# Patient Record
Sex: Female | Born: 1945 | Race: Asian | Hispanic: No | Smoking: Never smoker
Health system: Southern US, Community
[De-identification: ages and names within clinical notes are randomized; demographics above are authoritative.]

## PROBLEM LIST (undated history)

## (undated) DIAGNOSIS — E7800 Pure hypercholesterolemia, unspecified: Secondary | ICD-10-CM

## (undated) DIAGNOSIS — I10 Essential (primary) hypertension: Secondary | ICD-10-CM

## (undated) DIAGNOSIS — I5032 Chronic diastolic (congestive) heart failure: Secondary | ICD-10-CM

---

## 2016-02-24 ENCOUNTER — Encounter (HOSPITAL_COMMUNITY): Payer: Self-pay | Admitting: Emergency Medicine

## 2016-02-24 ENCOUNTER — Emergency Department (HOSPITAL_COMMUNITY): Payer: Medicaid Other

## 2016-02-24 ENCOUNTER — Inpatient Hospital Stay (HOSPITAL_COMMUNITY)
Admission: EM | Admit: 2016-02-24 | Discharge: 2016-04-09 | DRG: 003 | Disposition: A | Discharge status: Home Health | Payer: Medicaid Other | Attending: Internal Medicine | Admitting: Internal Medicine

## 2016-02-24 DIAGNOSIS — J9622 Acute and chronic respiratory failure with hypercapnia: Secondary | ICD-10-CM | POA: Diagnosis present

## 2016-02-24 DIAGNOSIS — E8729 Other acidosis: Secondary | ICD-10-CM | POA: Diagnosis present

## 2016-02-24 DIAGNOSIS — R451 Restlessness and agitation: Secondary | ICD-10-CM | POA: Diagnosis not present

## 2016-02-24 DIAGNOSIS — Z4659 Encounter for fitting and adjustment of other gastrointestinal appliance and device: Secondary | ICD-10-CM

## 2016-02-24 DIAGNOSIS — R1031 Right lower quadrant pain: Secondary | ICD-10-CM | POA: Diagnosis not present

## 2016-02-24 DIAGNOSIS — J9690 Respiratory failure, unspecified, unspecified whether with hypoxia or hypercapnia: Secondary | ICD-10-CM

## 2016-02-24 DIAGNOSIS — G9341 Metabolic encephalopathy: Secondary | ICD-10-CM | POA: Diagnosis not present

## 2016-02-24 DIAGNOSIS — J9621 Acute and chronic respiratory failure with hypoxia: Secondary | ICD-10-CM | POA: Diagnosis present

## 2016-02-24 DIAGNOSIS — G9340 Encephalopathy, unspecified: Secondary | ICD-10-CM | POA: Diagnosis present

## 2016-02-24 DIAGNOSIS — Z7982 Long term (current) use of aspirin: Secondary | ICD-10-CM

## 2016-02-24 DIAGNOSIS — E049 Nontoxic goiter, unspecified: Secondary | ICD-10-CM

## 2016-02-24 DIAGNOSIS — J9601 Acute respiratory failure with hypoxia: Secondary | ICD-10-CM

## 2016-02-24 DIAGNOSIS — Z79899 Other long term (current) drug therapy: Secondary | ICD-10-CM

## 2016-02-24 DIAGNOSIS — D649 Anemia, unspecified: Secondary | ICD-10-CM | POA: Diagnosis present

## 2016-02-24 DIAGNOSIS — Z01818 Encounter for other preprocedural examination: Secondary | ICD-10-CM

## 2016-02-24 DIAGNOSIS — J9811 Atelectasis: Secondary | ICD-10-CM

## 2016-02-24 DIAGNOSIS — I10 Essential (primary) hypertension: Secondary | ICD-10-CM | POA: Diagnosis present

## 2016-02-24 DIAGNOSIS — E042 Nontoxic multinodular goiter: Principal | ICD-10-CM | POA: Diagnosis present

## 2016-02-24 DIAGNOSIS — K5903 Drug induced constipation: Secondary | ICD-10-CM | POA: Diagnosis present

## 2016-02-24 DIAGNOSIS — N179 Acute kidney failure, unspecified: Secondary | ICD-10-CM | POA: Diagnosis present

## 2016-02-24 DIAGNOSIS — R1310 Dysphagia, unspecified: Secondary | ICD-10-CM | POA: Diagnosis present

## 2016-02-24 DIAGNOSIS — R109 Unspecified abdominal pain: Secondary | ICD-10-CM

## 2016-02-24 DIAGNOSIS — R1313 Dysphagia, pharyngeal phase: Secondary | ICD-10-CM | POA: Diagnosis present

## 2016-02-24 DIAGNOSIS — R34 Anuria and oliguria: Secondary | ICD-10-CM | POA: Diagnosis present

## 2016-02-24 DIAGNOSIS — T40605A Adverse effect of unspecified narcotics, initial encounter: Secondary | ICD-10-CM | POA: Diagnosis present

## 2016-02-24 DIAGNOSIS — J9602 Acute respiratory failure with hypercapnia: Secondary | ICD-10-CM

## 2016-02-24 DIAGNOSIS — J189 Pneumonia, unspecified organism: Secondary | ICD-10-CM | POA: Diagnosis present

## 2016-02-24 DIAGNOSIS — R627 Adult failure to thrive: Secondary | ICD-10-CM | POA: Diagnosis present

## 2016-02-24 DIAGNOSIS — Z930 Tracheostomy status: Secondary | ICD-10-CM

## 2016-02-24 DIAGNOSIS — J810 Acute pulmonary edema: Secondary | ICD-10-CM | POA: Diagnosis present

## 2016-02-24 DIAGNOSIS — E872 Acidosis: Secondary | ICD-10-CM | POA: Diagnosis not present

## 2016-02-24 DIAGNOSIS — Z931 Gastrostomy status: Secondary | ICD-10-CM | POA: Diagnosis present

## 2016-02-24 DIAGNOSIS — Z430 Encounter for attention to tracheostomy: Secondary | ICD-10-CM | POA: Insufficient documentation

## 2016-02-24 DIAGNOSIS — E871 Hypo-osmolality and hyponatremia: Secondary | ICD-10-CM | POA: Diagnosis not present

## 2016-02-24 DIAGNOSIS — Z9289 Personal history of other medical treatment: Secondary | ICD-10-CM

## 2016-02-24 DIAGNOSIS — K9429 Other complications of gastrostomy: Secondary | ICD-10-CM

## 2016-02-24 DIAGNOSIS — E870 Hyperosmolality and hypernatremia: Secondary | ICD-10-CM | POA: Diagnosis present

## 2016-02-24 DIAGNOSIS — F419 Anxiety disorder, unspecified: Secondary | ICD-10-CM | POA: Diagnosis present

## 2016-02-24 DIAGNOSIS — Z90710 Acquired absence of both cervix and uterus: Secondary | ICD-10-CM | POA: Diagnosis present

## 2016-02-24 DIAGNOSIS — K5900 Constipation, unspecified: Secondary | ICD-10-CM

## 2016-02-24 DIAGNOSIS — E890 Postprocedural hypothyroidism: Secondary | ICD-10-CM | POA: Diagnosis present

## 2016-02-24 DIAGNOSIS — I110 Hypertensive heart disease with heart failure: Secondary | ICD-10-CM | POA: Diagnosis present

## 2016-02-24 DIAGNOSIS — I959 Hypotension, unspecified: Secondary | ICD-10-CM | POA: Diagnosis not present

## 2016-02-24 DIAGNOSIS — Z6841 Body Mass Index (BMI) 40.0 and over, adult: Secondary | ICD-10-CM

## 2016-02-24 DIAGNOSIS — J9600 Acute respiratory failure, unspecified whether with hypoxia or hypercapnia: Secondary | ICD-10-CM

## 2016-02-24 DIAGNOSIS — J690 Pneumonitis due to inhalation of food and vomit: Secondary | ICD-10-CM | POA: Diagnosis present

## 2016-02-24 DIAGNOSIS — I5032 Chronic diastolic (congestive) heart failure: Secondary | ICD-10-CM | POA: Diagnosis present

## 2016-02-24 DIAGNOSIS — Z978 Presence of other specified devices: Secondary | ICD-10-CM

## 2016-02-24 DIAGNOSIS — E876 Hypokalemia: Secondary | ICD-10-CM | POA: Diagnosis present

## 2016-02-24 HISTORY — DX: Chronic diastolic (congestive) heart failure: I50.32

## 2016-02-24 HISTORY — DX: Essential (primary) hypertension: I10

## 2016-02-24 LAB — I-STAT ARTERIAL BLOOD GAS, ED
ACID-BASE EXCESS: 3 mmol/L — AB (ref 0.0–2.0)
BICARBONATE: 35.2 meq/L — AB (ref 20.0–24.0)
O2 Saturation: 94 %
PH ART: 7.176 — AB (ref 7.350–7.450)
TCO2: 38 mmol/L (ref 0–100)
pCO2 arterial: 95.7 mmHg (ref 35.0–45.0)
pO2, Arterial: 93 mmHg (ref 80.0–100.0)

## 2016-02-24 LAB — BASIC METABOLIC PANEL
Anion gap: 10 (ref 5–15)
BUN: 33 mg/dL — AB (ref 6–20)
CALCIUM: 8.7 mg/dL — AB (ref 8.9–10.3)
CO2: 33 mmol/L — AB (ref 22–32)
CREATININE: 1.2 mg/dL — AB (ref 0.44–1.00)
Chloride: 97 mmol/L — ABNORMAL LOW (ref 101–111)
GFR calc Af Amer: 53 mL/min — ABNORMAL LOW (ref >60)
GFR calc non Af Amer: 45 mL/min — ABNORMAL LOW (ref >60)
GLUCOSE: 140 mg/dL — AB (ref 65–99)
Potassium: 4.2 mmol/L (ref 3.5–5.1)
Sodium: 140 mmol/L (ref 135–145)

## 2016-02-24 LAB — CBC
HEMATOCRIT: 40.2 % (ref 36.0–46.0)
HEMOGLOBIN: 12 g/dL (ref 12.0–15.0)
MCH: 26 pg (ref 26.0–34.0)
MCHC: 29.9 g/dL — AB (ref 30.0–36.0)
MCV: 87 fL (ref 78.0–100.0)
Platelets: 280 10*3/uL (ref 150–400)
RBC: 4.62 MIL/uL (ref 3.87–5.11)
RDW: 17.2 % — ABNORMAL HIGH (ref 11.5–15.5)
WBC: 7.8 10*3/uL (ref 4.0–10.5)

## 2016-02-24 LAB — I-STAT TROPONIN, ED: Troponin i, poc: 0.09 ng/mL (ref 0.00–0.08)

## 2016-02-24 LAB — TROPONIN I: Troponin I: 0.14 ng/mL — ABNORMAL HIGH (ref <0.031)

## 2016-02-24 LAB — BRAIN NATRIURETIC PEPTIDE: B Natriuretic Peptide: 983.4 pg/mL — ABNORMAL HIGH (ref 0.0–100.0)

## 2016-02-24 LAB — D-DIMER, QUANTITATIVE: D-Dimer, Quant: 3.57 ug/mL-FEU — ABNORMAL HIGH (ref 0.00–0.50)

## 2016-02-24 MED ORDER — MORPHINE SULFATE (PF) 4 MG/ML IV SOLN
4.0000 mg | Freq: Once | INTRAVENOUS | Status: DC
  Filled 2016-02-24: qty 1

## 2016-02-24 MED ORDER — ETOMIDATE 2 MG/ML IV SOLN
20.0000 mg | Freq: Once | INTRAVENOUS | Status: AC
  Administered 2016-02-24: 20 mg via INTRAVENOUS

## 2016-02-24 MED ORDER — IOHEXOL 350 MG/ML SOLN: 50.0000 mL | Freq: Once | INTRAVENOUS | Status: DC | PRN

## 2016-02-24 MED ORDER — SUCCINYLCHOLINE CHLORIDE 20 MG/ML IJ SOLN
100.0000 mg | Freq: Once | INTRAMUSCULAR | Status: AC
  Administered 2016-02-24: 100 mg via INTRAVENOUS

## 2016-02-24 MED ORDER — IOHEXOL 350 MG/ML SOLN: 100.0000 mL | Freq: Once | INTRAVENOUS | Status: DC | PRN

## 2016-02-24 MED ORDER — ETOMIDATE 2 MG/ML IV SOLN
INTRAVENOUS | Status: AC | PRN
  Administered 2016-02-24: 20 mg via INTRAVENOUS

## 2016-02-24 MED ORDER — SUCCINYLCHOLINE CHLORIDE 20 MG/ML IJ SOLN
INTRAMUSCULAR | Status: AC | PRN
  Administered 2016-02-24: 100 mg via INTRAVENOUS

## 2016-02-24 MED ORDER — MIDAZOLAM HCL 2 MG/2ML IJ SOLN
INTRAMUSCULAR | Status: AC
  Administered 2016-02-24: 4 mg
  Filled 2016-02-24: qty 4

## 2016-02-24 MED ORDER — IOHEXOL 350 MG/ML SOLN
50.0000 mL | Freq: Once | INTRAVENOUS | Status: DC | PRN
Start: 2016-02-24 — End: 2016-03-09

## 2016-02-24 MED ORDER — LORAZEPAM 2 MG/ML IJ SOLN
0.5000 mg | Freq: Once | INTRAMUSCULAR | Status: AC
  Administered 2016-02-24: 2 mg via INTRAVENOUS
  Filled 2016-02-24: qty 1

## 2016-02-24 MED ORDER — FENTANYL CITRATE (PF) 100 MCG/2ML IJ SOLN
INTRAMUSCULAR | Status: AC
  Administered 2016-02-24: 50 ug
  Filled 2016-02-24: qty 2

## 2016-02-24 MED ORDER — PROPOFOL 1000 MG/100ML IV EMUL
5.0000 ug/kg/min | Freq: Once | INTRAVENOUS | Status: DC
  Administered 2016-02-24: 5 ug/kg/min via INTRAVENOUS

## 2016-02-24 MED ORDER — PROPOFOL 1000 MG/100ML IV EMUL
INTRAVENOUS | Status: AC
  Filled 2016-02-24: qty 100

## 2016-02-24 NOTE — ED Provider Notes (Signed)
CSN: [id number]     Arrival date & time 02/24/16  1928 History   First MD Initiated Contact with Patient 02/24/16 1958     Chief Complaint  Patient presents with  . Shortness of Breath  . Oral Swelling     HPI Patient presents to [place] from [place] with no known past medical history for hypertension.  She has been in the [place] for 3 weeks and it's reported that she's had increasing and persistent left neck swelling of the past 2-3 months but family feels like is beginning to increase in size and they now believe that she is having some swelling of her tongue.  There also reported increasing shortness of breath over the past 2 or 3 days.  She also has had increasing leg pain and swelling over the past several days.  No known history of DVT or pulmonary embolism.  She is not on any daily medications.  She was found to be hypoxic on arrival to the emergency department with initial oxygen saturations of 76%.  On 6 L nasal cannula her pulse oximetry is 94%.  She is tachypneic.  Unable to utilize translator services but she has 2 family members who speak good English who assisted me throughout the majority of the history   Past Medical History  Diagnosis Date  . Hypertension    History reviewed. No pertinent past surgical history. No family history on file. Social History  Substance Use Topics  . Smoking status: Never Smoker   . Smokeless tobacco: None  . Alcohol Use: No   OB History    No data available     Review of Systems  All other systems reviewed and are negative.     Allergies  Review of patient's allergies indicates not on file.  Home Medications   Prior to Admission medications   Medication Sig Start Date End Date Taking? Authorizing Provider  acetaminophen (TYLENOL) 650 MG CR tablet Take 650 mg by mouth 2 (two) times daily as needed for pain.   Yes Historical Provider, MD  aspirin EC 81 MG tablet Take 81 mg by mouth daily as needed (for high blood pressure).    Yes Historical Provider, MD  naproxen sodium (ANAPROX) 220 MG tablet Take 220 mg by mouth daily as needed (for knee pain).   Yes Historical Provider, MD  PRESCRIPTION MEDICATION Take 1 tablet by mouth daily as needed (for high blood pressure).   Yes Historical Provider, MD   BP 184/102 mmHg  Pulse 94  Temp(Src) 99 F (37.2 C) (Oral)  Resp 24  SpO2 97% Physical Exam  Constitutional: She is oriented to person, place, and time. She appears well-developed and well-nourished.  Tachypnea.  Morbidly obese  HENT:  Head: Atraumatic.  Mouth/Throat: Oropharynx is clear and moist.  Poor dentition throughout.  Multiple teeth are absent  Eyes: EOM are normal.  Neck: Normal range of motion.  Large anterior neck mass left greater than right without overlying skin changes or erythema.  Cardiovascular: Regular rhythm and normal heart sounds.   Tachycardic  Pulmonary/Chest:  Tachypnea.  Mild accessory muscle use.  Mild rales or rhonchi in the bases  Abdominal: Soft. She exhibits no distension. There is no tenderness.  Neurological: She is alert and oriented to person, place, and time.  Skin: Skin is warm and dry.  Psychiatric: She has a normal mood and affect.  Nursing note and vitals reviewed.   ED Course  .Critical Care Performed by: Azalia Bilis Authorized by: Patria Mane  Nick Stults Total critical care time: 35 minutes Critical care time was exclusive of separately billable procedures and treating other patients. Critical care was necessary to treat or prevent imminent or life-threatening deterioration of the following conditions: respiratory failure. Critical care was time spent personally by me on the following activities: discussions with consultants, examination of patient, evaluation of patient's response to treatment, obtaining history from patient or surrogate, ordering and performing treatments and interventions, ordering and review of laboratory studies, ordering and review of radiographic  studies, pulse oximetry, ventilator management and re-evaluation of patient's condition.  .Intubation Performed by: Azalia BilisAMPOS, Andre Swander Authorized by: Azalia BilisAMPOS, Wylee Ogden Consent: Verbal consent obtained. Consent given by: patient Patient identity confirmed: arm band Time out: Immediately prior to procedure a "time out" was called to verify the correct patient, procedure, equipment, support staff and site/side marked as required. Indications: respiratory failure Intubation method: video-assisted Patient status: paralyzed (RSI) Preoxygenation: nonrebreather mask Sedatives: etomidate Paralytic: succinylcholine Laryngoscope size: glide 3. Tube size: 7.5 mm Tube type: cuffed Number of attempts: 1 Cords visualized: yes Post-procedure assessment: chest rise and CO2 detector Breath sounds: equal and absent over the epigastrium Cuff inflated: yes Tube secured with: ETT holder Patient tolerance: Patient tolerated the procedure well with no immediate complications Comments: Intubation confirmed on CT chest, ET tube in good position   (including critical care time) Labs Review Labs Reviewed  BASIC METABOLIC PANEL - Abnormal; Notable for the following:    Chloride 97 (*)    CO2 33 (*)    Glucose, Bld 140 (*)    BUN 33 (*)    Creatinine, Ser 1.20 (*)    Calcium 8.7 (*)    GFR calc non Af Amer 45 (*)    GFR calc Af Amer 53 (*)    All other components within normal limits  CBC - Abnormal; Notable for the following:    MCHC 29.9 (*)    RDW 17.2 (*)    All other components within normal limits  TROPONIN I - Abnormal; Notable for the following:    Troponin I 0.14 (*)    All other components within normal limits  D-DIMER, QUANTITATIVE (NOT AT [facility]) - Abnormal; Notable for the following:    D-Dimer, Quant 3.57 (*)    All other components within normal limits  BRAIN NATRIURETIC PEPTIDE - Abnormal; Notable for the following:    B Natriuretic Peptide 983.4 (*)    All other components within normal  limits  I-STAT TROPOININ, ED - Abnormal; Notable for the following:    Troponin i, poc 0.09 (*)    All other components within normal limits  I-STAT ARTERIAL BLOOD GAS, ED - Abnormal; Notable for the following:    pH, Arterial 7.176 (*)    pCO2 arterial 95.7 (*)    Bicarbonate 35.2 (*)    Acid-Base Excess 3.0 (*)    All other components within normal limits  TSH    Imaging Review Dg Chest Port 1 View  02/24/2016  CLINICAL DATA:  Subacute onset of hypoxia, shortness of breath and lower extremity edema. Initial encounter. EXAM: PORTABLE CHEST 1 VIEW COMPARISON:  None. FINDINGS: The lungs are hypoexpanded. Vascular congestion is noted. Mild left basilar atelectasis is seen. There is no evidence of pleural effusion or pneumothorax. The cardiomediastinal silhouette is mildly enlarged. No acute osseous abnormalities are seen. IMPRESSION: Lungs hypoexpanded, with mild left basilar atelectasis. Vascular congestion and mild cardiomegaly. Electronically Signed   By: Roanna RaiderJeffery  Chang M.D.   On: 02/24/2016 20:16   I  have personally reviewed and evaluated these images and lab results as part of my medical decision-making.   EKG Interpretation   Date/Time:  Friday February 24 2016 19:47:42 EDT Ventricular Rate:  92 PR Interval:  159 QRS Duration: 97 QT Interval:  370 QTC Calculation: 458 R Axis:   27 Text Interpretation:  Sinus rhythm Borderline repolarization abnormality  Minimal ST elevation, anterior leads Baseline wander in lead(s) V3 No old  tracing to compare Confirmed by Lillianah Swartzentruber  MD, Caryn Bee ([id number]) on 02/24/2016  8:25:01 PM      MDM   Final diagnoses:  Acute respiratory failure with hypercapnia (HCC)    Concerning for the possibility of pulmonary embolism versus congestive heart failure versus pneumonia.  Unclear etiology at this anterior neck swelling.  This could be a thyroid issue.  This could be a developing and worsening dental abscess.  Patient will need to undergo CT imaging of her  neck.  D-dimer elevated and therefore she will need CT angiogram of her chest as well for the possibility of pulmonary embolism.  There is no ability to obtain lower extremity duplexes at this time.  On 6 L nasal cannula the patient's O2 sats are 94% we'll continue to follow her closely.  ABG pending.  11:44 PM Developed worsening mental status. Found to be acidotic and hypercarbic. Intubated for tx of respiratory failure. Just returned from CT imaging neck and chest  12:27 AM Awaiting read of the CT scan by radiology but my basic interpretation demonstrates what appears to be a large thyroid mass versus goiter.  Thyroid studies ordered.  Given the size of this mass much of this may have been compressing her airway leading to her respiratory failure.  Once the tube was passed through the cords there was some difficulty advancing past the cords and this was likely secondary to thyroid mass and mass effect.  Patient will be admitted to the intensive care unit.  I spoke with Dr. Esmeralda Arthur, MD 02/25/16 [phone]

## 2016-02-24 NOTE — Progress Notes (Signed)
RT assisted ED MD with intubation. Bilateral breath sounds present. ETCO2 positive color change. ABG and CXR pending. Patient also transported to CT from ED back to ED without any complications.

## 2016-02-24 NOTE — ED Notes (Signed)
Pt here from [name] 3 weeks ago.  C/o sob, tongue swelling, and bilateral lower extremity swelling x 3 days.  Pt also has swelling to L side of neck that family reports has been there for 2-3 months.  O2 sats 76% at triage.

## 2016-02-25 ENCOUNTER — Inpatient Hospital Stay (HOSPITAL_COMMUNITY): Payer: Medicaid Other

## 2016-02-25 DIAGNOSIS — D649 Anemia, unspecified: Secondary | ICD-10-CM | POA: Diagnosis present

## 2016-02-25 DIAGNOSIS — R34 Anuria and oliguria: Secondary | ICD-10-CM | POA: Diagnosis present

## 2016-02-25 DIAGNOSIS — N179 Acute kidney failure, unspecified: Secondary | ICD-10-CM | POA: Diagnosis not present

## 2016-02-25 DIAGNOSIS — G9341 Metabolic encephalopathy: Secondary | ICD-10-CM | POA: Diagnosis not present

## 2016-02-25 DIAGNOSIS — E042 Nontoxic multinodular goiter: Secondary | ICD-10-CM | POA: Diagnosis present

## 2016-02-25 DIAGNOSIS — I959 Hypotension, unspecified: Secondary | ICD-10-CM | POA: Diagnosis not present

## 2016-02-25 DIAGNOSIS — J9601 Acute respiratory failure with hypoxia: Secondary | ICD-10-CM

## 2016-02-25 DIAGNOSIS — J9690 Respiratory failure, unspecified, unspecified whether with hypoxia or hypercapnia: Secondary | ICD-10-CM | POA: Diagnosis present

## 2016-02-25 DIAGNOSIS — E049 Nontoxic goiter, unspecified: Secondary | ICD-10-CM | POA: Diagnosis present

## 2016-02-25 DIAGNOSIS — I110 Hypertensive heart disease with heart failure: Secondary | ICD-10-CM | POA: Diagnosis present

## 2016-02-25 DIAGNOSIS — R451 Restlessness and agitation: Secondary | ICD-10-CM | POA: Diagnosis not present

## 2016-02-25 DIAGNOSIS — Z79899 Other long term (current) drug therapy: Secondary | ICD-10-CM | POA: Diagnosis not present

## 2016-02-25 DIAGNOSIS — R1313 Dysphagia, pharyngeal phase: Secondary | ICD-10-CM | POA: Diagnosis present

## 2016-02-25 DIAGNOSIS — E890 Postprocedural hypothyroidism: Secondary | ICD-10-CM | POA: Diagnosis not present

## 2016-02-25 DIAGNOSIS — J690 Pneumonitis due to inhalation of food and vomit: Secondary | ICD-10-CM | POA: Diagnosis not present

## 2016-02-25 DIAGNOSIS — T40605A Adverse effect of unspecified narcotics, initial encounter: Secondary | ICD-10-CM | POA: Diagnosis present

## 2016-02-25 DIAGNOSIS — I5032 Chronic diastolic (congestive) heart failure: Secondary | ICD-10-CM | POA: Diagnosis present

## 2016-02-25 DIAGNOSIS — K5903 Drug induced constipation: Secondary | ICD-10-CM | POA: Diagnosis present

## 2016-02-25 DIAGNOSIS — R627 Adult failure to thrive: Secondary | ICD-10-CM | POA: Diagnosis present

## 2016-02-25 DIAGNOSIS — Z7982 Long term (current) use of aspirin: Secondary | ICD-10-CM | POA: Diagnosis not present

## 2016-02-25 DIAGNOSIS — E871 Hypo-osmolality and hyponatremia: Secondary | ICD-10-CM | POA: Diagnosis not present

## 2016-02-25 DIAGNOSIS — R1031 Right lower quadrant pain: Secondary | ICD-10-CM | POA: Diagnosis not present

## 2016-02-25 DIAGNOSIS — E876 Hypokalemia: Secondary | ICD-10-CM | POA: Diagnosis present

## 2016-02-25 DIAGNOSIS — J9621 Acute and chronic respiratory failure with hypoxia: Secondary | ICD-10-CM | POA: Diagnosis present

## 2016-02-25 DIAGNOSIS — E870 Hyperosmolality and hypernatremia: Secondary | ICD-10-CM | POA: Diagnosis not present

## 2016-02-25 DIAGNOSIS — E872 Acidosis: Secondary | ICD-10-CM | POA: Diagnosis not present

## 2016-02-25 DIAGNOSIS — Z6841 Body Mass Index (BMI) 40.0 and over, adult: Secondary | ICD-10-CM | POA: Diagnosis not present

## 2016-02-25 DIAGNOSIS — J9622 Acute and chronic respiratory failure with hypercapnia: Secondary | ICD-10-CM | POA: Diagnosis present

## 2016-02-25 DIAGNOSIS — F419 Anxiety disorder, unspecified: Secondary | ICD-10-CM | POA: Diagnosis present

## 2016-02-25 LAB — MAGNESIUM: Magnesium: 1.7 mg/dL (ref 1.7–2.4)

## 2016-02-25 LAB — CBC
HCT: 38.4 % (ref 36.0–46.0)
Hemoglobin: 11.2 g/dL — ABNORMAL LOW (ref 12.0–15.0)
MCH: 25.2 pg — ABNORMAL LOW (ref 26.0–34.0)
MCHC: 29.2 g/dL — ABNORMAL LOW (ref 30.0–36.0)
MCV: 86.3 fL (ref 78.0–100.0)
PLATELETS: 232 10*3/uL (ref 150–400)
RBC: 4.45 MIL/uL (ref 3.87–5.11)
RDW: 17 % — AB (ref 11.5–15.5)
WBC: 9.3 10*3/uL (ref 4.0–10.5)

## 2016-02-25 LAB — POCT I-STAT 3, ART BLOOD GAS (G3+)
Acid-Base Excess: 9 mmol/L — ABNORMAL HIGH (ref 0.0–2.0)
BICARBONATE: 35.2 meq/L — AB (ref 20.0–24.0)
O2 SAT: 94 %
TCO2: 37 mmol/L (ref 0–100)
pCO2 arterial: 52.4 mmHg — ABNORMAL HIGH (ref 35.0–45.0)
pH, Arterial: 7.435 (ref 7.350–7.450)
pO2, Arterial: 71 mmHg — ABNORMAL LOW (ref 80.0–100.0)

## 2016-02-25 LAB — URINALYSIS, ROUTINE W REFLEX MICROSCOPIC
BILIRUBIN URINE: NEGATIVE
GLUCOSE, UA: NEGATIVE mg/dL
HGB URINE DIPSTICK: NEGATIVE
Ketones, ur: NEGATIVE mg/dL
Leukocytes, UA: NEGATIVE
Nitrite: NEGATIVE
PROTEIN: 100 mg/dL — AB
Specific Gravity, Urine: 1.046 — ABNORMAL HIGH (ref 1.005–1.030)
pH: 5.5 (ref 5.0–8.0)

## 2016-02-25 LAB — I-STAT ARTERIAL BLOOD GAS, ED
ACID-BASE EXCESS: 8 mmol/L — AB (ref 0.0–2.0)
Bicarbonate: 37 mEq/L — ABNORMAL HIGH (ref 20.0–24.0)
O2 SAT: 98 %
PH ART: 7.298 — AB (ref 7.350–7.450)
TCO2: 39 mmol/L (ref 0–100)
pCO2 arterial: 75.7 mmHg (ref 35.0–45.0)
pO2, Arterial: 127 mmHg — ABNORMAL HIGH (ref 80.0–100.0)

## 2016-02-25 LAB — URINE MICROSCOPIC-ADD ON
RBC / HPF: NONE SEEN RBC/hpf (ref 0–5)
WBC, UA: NONE SEEN WBC/hpf (ref 0–5)

## 2016-02-25 LAB — CREATININE, SERUM
CREATININE: 1.21 mg/dL — AB (ref 0.44–1.00)
GFR, EST AFRICAN AMERICAN: 52 mL/min — AB (ref >60)
GFR, EST NON AFRICAN AMERICAN: 45 mL/min — AB (ref >60)

## 2016-02-25 LAB — MRSA PCR SCREENING: MRSA BY PCR: NEGATIVE

## 2016-02-25 LAB — TSH: TSH: 1.27 u[IU]/mL (ref 0.350–4.500)

## 2016-02-25 LAB — TRIGLYCERIDES: TRIGLYCERIDES: 91 mg/dL (ref <150)

## 2016-02-25 LAB — PHOSPHORUS: Phosphorus: 4.8 mg/dL — ABNORMAL HIGH (ref 2.5–4.6)

## 2016-02-25 MED ORDER — ASPIRIN 81 MG PO CHEW: 324.0000 mg | CHEWABLE_TABLET | ORAL | Status: AC

## 2016-02-25 MED ORDER — PANTOPRAZOLE SODIUM 40 MG IV SOLR
40.0000 mg | INTRAVENOUS | Status: DC
  Administered 2016-02-25 – 2016-03-01 (×6): 40 mg via INTRAVENOUS
  Filled 2016-02-25 (×4): qty 40

## 2016-02-25 MED ORDER — FENTANYL CITRATE (PF) 100 MCG/2ML IJ SOLN
50.0000 ug | INTRAMUSCULAR | Status: DC | PRN
  Administered 2016-02-26 – 2016-03-02 (×11): 50 ug via INTRAVENOUS
  Filled 2016-02-25 (×11): qty 2

## 2016-02-25 MED ORDER — FENTANYL CITRATE (PF) 100 MCG/2ML IJ SOLN
50.0000 ug | INTRAMUSCULAR | Status: DC | PRN
  Administered 2016-02-26 – 2016-02-27 (×2): 50 ug via INTRAVENOUS
  Filled 2016-02-25 (×2): qty 2

## 2016-02-25 MED ORDER — ASPIRIN 300 MG RE SUPP
300.0000 mg | RECTAL | Status: AC
  Administered 2016-02-25: 300 mg via RECTAL
  Filled 2016-02-25: qty 1

## 2016-02-25 MED ORDER — ENOXAPARIN SODIUM 40 MG/0.4ML ~~LOC~~ SOLN: 40.0000 mg | Freq: Every day | SUBCUTANEOUS | Status: DC

## 2016-02-25 MED ORDER — MIDAZOLAM HCL 2 MG/2ML IJ SOLN
1.0000 mg | INTRAMUSCULAR | Status: DC | PRN
  Administered 2016-03-02: 1 mg via INTRAVENOUS
  Filled 2016-02-25: qty 2

## 2016-02-25 MED ORDER — MIDAZOLAM HCL 2 MG/2ML IJ SOLN
1.0000 mg | INTRAMUSCULAR | Status: DC | PRN
  Administered 2016-02-26: 1 mg via INTRAVENOUS
  Filled 2016-02-25: qty 2

## 2016-02-25 MED ORDER — PROPOFOL 1000 MG/100ML IV EMUL
INTRAVENOUS | Status: AC
  Administered 2016-02-25: 50 mg via INTRAVENOUS
  Filled 2016-02-25: qty 100

## 2016-02-25 MED ORDER — PROPOFOL 1000 MG/100ML IV EMUL
0.0000 ug/kg/min | INTRAVENOUS | Status: DC
  Administered 2016-02-25: 25 ug/kg/min via INTRAVENOUS
  Administered 2016-02-25: 30 ug/kg/min via INTRAVENOUS
  Administered 2016-02-25: 50 ug/kg/min via INTRAVENOUS
  Administered 2016-02-25: 45 ug/kg/min via INTRAVENOUS
  Administered 2016-02-26 (×4): 25 ug/kg/min via INTRAVENOUS
  Administered 2016-02-27 (×3): 35 ug/kg/min via INTRAVENOUS
  Administered 2016-02-27: 45 ug/kg/min via INTRAVENOUS
  Administered 2016-02-27: 40 ug/kg/min via INTRAVENOUS
  Administered 2016-02-28: 35 ug/kg/min via INTRAVENOUS
  Administered 2016-02-28: 25 ug/kg/min via INTRAVENOUS
  Administered 2016-02-28: 35 ug/kg/min via INTRAVENOUS
  Administered 2016-02-29: 30 ug/kg/min via INTRAVENOUS
  Administered 2016-02-29: 50 ug/kg/min via INTRAVENOUS
  Administered 2016-02-29: 10 ug/kg/min via INTRAVENOUS
  Administered 2016-02-29: 15 ug/kg/min via INTRAVENOUS
  Administered 2016-02-29: 40 ug/kg/min via INTRAVENOUS
  Administered 2016-03-01 (×2): 30 ug/kg/min via INTRAVENOUS
  Administered 2016-03-01: 20 ug/kg/min via INTRAVENOUS
  Administered 2016-03-01 – 2016-03-02 (×2): 30 ug/kg/min via INTRAVENOUS
  Filled 2016-02-25 (×26): qty 100

## 2016-02-25 MED ORDER — SODIUM CHLORIDE 0.9 % IV SOLN
250.0000 mL | INTRAVENOUS | Status: DC | PRN
  Administered 2016-02-25: 250 mL via INTRAVENOUS

## 2016-02-25 MED ORDER — ANTISEPTIC ORAL RINSE SOLUTION (CORINZ)
7.0000 mL | Freq: Four times a day (QID) | OROMUCOSAL | Status: DC
  Administered 2016-02-25 – 2016-02-28 (×13): 7 mL via OROMUCOSAL

## 2016-02-25 MED ORDER — DEXTROSE 5 % IV SOLN
500.0000 mg | INTRAVENOUS | Status: AC
  Administered 2016-02-25 – 2016-02-26 (×3): 500 mg via INTRAVENOUS
  Filled 2016-02-25 (×4): qty 500

## 2016-02-25 MED ORDER — PROPOFOL 1000 MG/100ML IV EMUL
0.0000 ug/kg/min | INTRAVENOUS | Status: DC
  Administered 2016-02-25: 50 mg via INTRAVENOUS

## 2016-02-25 MED ORDER — FUROSEMIDE 10 MG/ML IJ SOLN
20.0000 mg | Freq: Once | INTRAMUSCULAR | Status: AC
  Administered 2016-02-25: 20 mg via INTRAVENOUS
  Filled 2016-02-25: qty 2

## 2016-02-25 MED ORDER — DEXTROSE 5 % IV SOLN
2.0000 g | INTRAVENOUS | Status: DC
  Administered 2016-02-25 – 2016-02-26 (×3): 2 g via INTRAVENOUS
  Filled 2016-02-25 (×5): qty 2

## 2016-02-25 MED ORDER — CHLORHEXIDINE GLUCONATE 0.12% ORAL RINSE (MEDLINE KIT)
15.0000 mL | Freq: Two times a day (BID) | OROMUCOSAL | Status: DC
  Administered 2016-02-25 – 2016-02-28 (×7): 15 mL via OROMUCOSAL

## 2016-02-25 MED ORDER — HEPARIN SODIUM (PORCINE) 5000 UNIT/ML IJ SOLN
5000.0000 [IU] | Freq: Three times a day (TID) | INTRAMUSCULAR | Status: DC
  Administered 2016-02-25 – 2016-02-27 (×8): 5000 [IU] via SUBCUTANEOUS
  Filled 2016-02-25 (×8): qty 1

## 2016-02-25 MED ORDER — HYDRALAZINE HCL 20 MG/ML IJ SOLN
5.0000 mg | INTRAMUSCULAR | Status: DC | PRN
  Administered 2016-02-25 – 2016-02-26 (×3): 5 mg via INTRAVENOUS
  Filled 2016-02-25 (×3): qty 1

## 2016-02-25 NOTE — H&P (Signed)
PULMONARY / CRITICAL CARE MEDICINE HISTORY AND PHYSICAL EXAMINATION   Name: Kendra Osborne MRN: [id number] DOB: 03/29/1946    ADMISSION DATE:  02/24/2016  PRIMARY SERVICE: PCCM  CHIEF COMPLAINT:  Dyspnea  BRIEF PATIENT DESCRIPTION: 70 y/o woman recently moved from [name] who presents with respiratory distress.  SIGNIFICANT EVENTS / STUDIES:  CT-A of Chest >> mild mediastinal enlargement CT of the Neck with contrast >> multinodular goiter, meningioma  LINES / TUBES: 7.515mm OETT >> 3/17 PIV x2 >> 3/18 OGT >> 3/18 Foley  >> 3/18  CULTURES: Blood (pending) Sputum (pending)  ANTIBIOTICS: Azithro >> CTX >>  HISTORY OF PRESENT ILLNESS:   Kendra Osborne is a 70 y/o woman who presented to the ED on 3/17 with complaints of dyspnea, and lower extremity edema. History is limited due to language / culture barriers, but she has a PMHx of a known goiter that has been present for years, as well as possibly hypertension. It is not clear if the goiter was ever worked up or if she ever was on regular therapy for her hypertension. Her Nephew and son (who speaks little [place]) report that she been having a cough for a few days as well as some fevers and chills.  PAST MEDICAL HISTORY :  Past Medical History  Diagnosis Date  . Hypertension    History reviewed. No pertinent past surgical history. Prior to Admission medications   Medication Sig Start Date End Date Taking? Authorizing Provider  acetaminophen (TYLENOL) 650 MG CR tablet Take 650 mg by mouth 2 (two) times daily as needed for pain.   Yes Historical Provider, MD  aspirin EC 81 MG tablet Take 81 mg by mouth daily as needed (for high blood pressure).   Yes Historical Provider, MD  naproxen sodium (ANAPROX) 220 MG tablet Take 220 mg by mouth daily as needed (for knee pain).   Yes Historical Provider, MD  PRESCRIPTION MEDICATION Take 1 tablet by mouth daily as needed (for high blood pressure).   Yes Historical Provider, MD   Not on  File  FAMILY HISTORY:  No family history on file. SOCIAL HISTORY:  reports that she has never smoked. She does not have any smokeless tobacco history on file. She reports that she does not drink alcohol or use illicit drugs.  REVIEW OF SYSTEMS:  Unable to obtain 2/2 intubated.  SUBJECTIVE:   VITAL SIGNS: Temp:  [99 F (37.2 C)] 99 F (37.2 C) (03/17 1933) Pulse Rate:  [91-97] 91 (03/18 0000) Resp:  [14-28] 18 (03/18 0000) BP: (184-237)/(84-188) 237/188 mmHg (03/18 0000) SpO2:  [76 %-100 %] 100 % (03/18 0000) FiO2 (%):  [60 %] 60 % (03/17 2310) HEMODYNAMICS:   VENTILATOR SETTINGS: Vent Mode:  [-] PRVC FiO2 (%):  [60 %] 60 % Set Rate:  [24 bmp] 24 bmp Vt Set:  [380 mL] 380 mL PEEP:  [5 cmH20] 5 cmH20 Plateau Pressure:  [20 cmH20] 20 cmH20 INTAKE / OUTPUT: Intake/Output    None     PHYSICAL EXAMINATION: General:  Obese woman, intubated Neuro:  Sedated HEENT: Moist mucus membranes, OETT in place. Neck: Firm, palpable goiter. Cardiovascular: Heart sounds dual and normal. Lungs:  Mechanical Breath Sounds Abdomen:  Obese, soft Musculoskeletal:  No swollen joints Skin:  No rashes on exposed skin  LABS:  CBC  Recent Labs Lab 02/24/16 2000  WBC 7.8  HGB 12.0  HCT 40.2  PLT 280   Coag's No results for input(s): APTT, INR in the last 168 hours. BMET  Recent Labs  Lab 02/24/16 2000  NA 140  K 4.2  CL 97*  CO2 33*  BUN 33*  CREATININE 1.20*  GLUCOSE 140*   Electrolytes  Recent Labs Lab 02/24/16 2000  CALCIUM 8.7*   Sepsis Markers No results for input(s): LATICACIDVEN, PROCALCITON, O2SATVEN in the last 168 hours. ABG  Recent Labs Lab 02/24/16 2249 02/25/16 0048  PHART 7.176* 7.298*  PCO2ART 95.7* 75.7*  PO2ART 93.0 127.0*   Liver Enzymes No results for input(s): AST, ALT, ALKPHOS, BILITOT, ALBUMIN in the last 168 hours. Cardiac Enzymes  Recent Labs Lab 02/24/16 2000  TROPONINI 0.14*   Glucose No results for input(s): GLUCAP in the  last 168 hours.  Imaging Ct Soft Tissue Neck W Contrast  02/25/2016  CLINICAL DATA:  Initial evaluation for left neck swelling. EXAM: CT NECK WITH CONTRAST TECHNIQUE: Multidetector CT imaging of the neck was performed using the standard protocol following the bolus administration of intravenous contrast. CONTRAST:  100 cc of Omni 350. COMPARISON:  None. FINDINGS: 2.6 x 1.8 x 2.4 cm meningioma present at the right sphenoid wing. No significant edema. Partially visualized portions of the brain otherwise unremarkable. Globes and orbits demonstrate no acute abnormality. Sequela of lens extraction noted on the left. Scattered mucosal thickening within the ethmoidal air cells. Mild mucosal thickening within the right maxillary sinus. No air-fluid level to suggest active sinus infection. Mastoid air cells clear. Middle ear cavities well pneumatized. Salivary glands including the parotid glands and submandibular glands within normal limits. Oral cavity demonstrates no acute abnormality. Patient is intubated with enteric tube in place. Tip of the endotracheal tube position in medially at the carina. Poor dentition noted. No acute abnormality about the dentition. Palatine tonsils grossly normal. Parapharyngeal fat preserved. Fluid within the nasopharynx. No retropharyngeal fluid collection. Epiglottis and vallecula not well evaluated due to presence of endotracheal tube. Remainder of the hypopharynx and supraglottic larynx grossly unremarkable, but also not well evaluated due to intubation. True cords not well seen. Subglottic airway relatively clear other than the endotracheal tube. Mildly prominent right supraclavicular node measuring 17 mm in short axis, of uncertain significance. No other adenopathy within the neck. Thyroid gland is markedly enlarged and heterogeneous in appearance, with asymmetric enlargement of the left thyroid lobe. Findings most consistent with multinodular goiter. Presumably this is the palpable  area of concern. No significant inflammatory changes. The visualized superior mediastinum demonstrates no acute abnormality. Shotty mediastinal lymph nodes noted. The partially visualized lungs are grossly clear. Normal intravascular enhancement seen throughout the neck. Carotid arteries are displaced by the enlarged thyroid. No acute osseous abnormality. No worrisome lytic or blastic osseous lesions. IMPRESSION: 1. Markedly enlarged and heterogeneous thyroid gland, with the left lobe of thyroid larger than the right. Findings most consistent with multinodular goiter. Query whether this represents the palpable area of concern. Correlation with physical exam recommended. 2. No other acute abnormality identified within the neck. Poor dentition without acute inflammatory changes. 3. Prominent 17 mm right supraclavicular lymph node, of uncertain significance. No other adenopathy within the neck. 4. Endotracheal tube in place with tip in medially at the carina. Retraction by 2 cm suggested to ovoid slippage into the mainstem bronchi. 5. 2.6 x 1.8 x 2.4 cm right sphenoid wing meningioma without associated edema. Electronically Signed   By: Rise Mu M.D.   On: 02/25/2016 00:47   Ct Angio Chest Pe W/cm &/or Wo Cm  02/25/2016  CLINICAL DATA:  Hypoxia. EXAM: CT ANGIOGRAPHY CHEST WITH CONTRAST TECHNIQUE: Multidetector CT imaging of  the chest was performed using the standard protocol during bolus administration of intravenous contrast. Multiplanar CT image reconstructions and MIPs were obtained to evaluate the vascular anatomy. CONTRAST:  100 mL Omnipaque 350 intravenous COMPARISON:  Radiographs 02/24/2016 FINDINGS: Cardiovascular: There is good opacification of the pulmonary arteries. There is no pulmonary embolism. The thoracic aorta is normal in caliber and intact. There is calcified coronary artery plaque. Lungs: Patchy opacities in the posterior bases, right greater than left. This may be atelectatic.  Central airways: Endotracheal tube extends to 1 cm above the carina. Central airways are patent. Effusions: None Lymphadenopathy: Nonspecific nodes in the mediastinum and hila. There is marked thyroid enlargement extending into the upper chest. Esophagus: Unremarkable Upper abdomen: Multiple calculi within the gallbladder. Musculoskeletal: No significant abnormality Review of the MIP images confirms the above findings. IMPRESSION: 1. Negative for acute pulmonary embolism. 2. Marked thyroid enlargement extending into the chest. 3. Atelectatic appearing basilar opacities, right greater than left. 4. Nonspecific nodes in the mediastinum and hila. 5. Cholelithiasis. Electronically Signed   By: Ellery Plunk M.D.   On: 02/25/2016 00:27   Dg Chest Port 1 View  02/24/2016  CLINICAL DATA:  Subacute onset of hypoxia, shortness of breath and lower extremity edema. Initial encounter. EXAM: PORTABLE CHEST 1 VIEW COMPARISON:  None. FINDINGS: The lungs are hypoexpanded. Vascular congestion is noted. Mild left basilar atelectasis is seen. There is no evidence of pleural effusion or pneumothorax. The cardiomediastinal silhouette is mildly enlarged. No acute osseous abnormalities are seen. IMPRESSION: Lungs hypoexpanded, with mild left basilar atelectasis. Vascular congestion and mild cardiomegaly. Electronically Signed   By: Roanna Raider M.D.   On: 02/24/2016 20:16    EKG: NSR CXR: Chest CT reviewed and showed significant proximal airway stenosis associated with multinodular goiter.  ASSESSMENT / PLAN:  Active Problems:   Respiratory failure (HCC)   PULMONARY A: Acute on chronic Hypercarbic Respiratory Failure Acute Hypoxic Respiratory Failure Threatened airway to to extrinsic compression  In both extrathoracic P:   Unclear etiology of hypoxia; not likely to be due to goiter, although it is possible. Favor heart failure and/or infectious process. Will treat w/ lasix and CAP coverage. Infectious  (specifically viral) causes, while interesting given her travel history, seem very unlikely given lack of fevers, WBC elevation, and supportive imaging findings. Per history, goiter is chronic, but may swell given large Iodine load from IV contrast.  Maintain on PRVC. May require ENT evaluation if unable to safely evaluate ability to extubate  CARDIOVASCULAR A: HTN P:   Will treat with PRN hydralazine and/or labetalol IV for BP control.  RENAL A: Renal insufficiency P:   Not likely to be chronic.  GASTROINTESTINAL A: Obesity P:    HEMATOLOGIC A: No active issues P:   DVT PPX  INFECTIOUS A: Possible respiratory infection, but unlikely P:   CAP coverage in addition to usual, supportive care.  ENDOCRINE A: Multinodular Goiter P:   Normal TSH  NEUROLOGIC A: Need for sedation P:   Will treat with propofol  BEST PRACTICE / DISPOSITION Level of Care:  ICU Primary Service:  PCCM Consultants:  None, Code Status:  Full Diet:  NPO DVT Px:  Enox GI Px:  Famotidine. Skin Integrity:  Visible areas infact. Social / Family:  Updated  On admissions.  TODAY'S SUMMARY: 70 y/o woman with multinodular data and respiratory distress.  I have personally obtained a history, examined the patient, evaluated laboratory and imaging results, formulated the assessment and plan and placed orders.  CRITICAL CARE: The patient is critically ill with multiple organ systems failure and requires high complexity decision making for assessment and support, frequent evaluation and titration of therapies, application of advanced monitoring technologies and extensive interpretation of multiple databases. Critical Care Time devoted to patient care services described in this note is 45 minutes.   Jamie Kato, MD Pulmonary and Critical Care Medicine [facility] Pager: [phone]   02/25/2016, 1:28 AM

## 2016-02-25 NOTE — ED Notes (Signed)
Intensive care MD at the bedside. 

## 2016-02-25 NOTE — Progress Notes (Signed)
Utilization Review Completed.Kendra Osborne T3/18/2017  

## 2016-02-25 NOTE — Progress Notes (Signed)
eLink Physician-Brief Progress Note Patient Name: Kendra Osborne DOB: 02/13/1946 MRN: [id number]   Date of Service  02/25/2016  HPI/Events of Note  Pt admitted from ED with hypercarbic resp failure, goiter, pneumonia  eICU Interventions  Stable on camera check. Sedation orders written     Intervention Category Evaluation Type: New Patient Evaluation  Kendra Osborne 02/25/2016, 2:38 AM

## 2016-02-25 NOTE — Progress Notes (Signed)
eLink Physician-Brief Progress Note Patient Name: Kendra Osborne DOB: 05/07/1946 MRN: [id number]   Date of Service  02/25/2016  HPI/Events of Note  Hypertension. SBP 190s  eICU Interventions  Hydralazine PRN     Intervention Category Intermediate Interventions: Hypertension - evaluation and management  Garrie Elenes 02/25/2016, 4:49 AM

## 2016-02-25 NOTE — Progress Notes (Signed)
Patient transported on vent to room 54M-04 from ED.  Patient tolerated well, no complications.

## 2016-02-25 NOTE — Progress Notes (Signed)
Initial Nutrition Assessment  DOCUMENTATION CODES:  Morbid obesity  INTERVENTION:  If unable to extubate within 24 hours and medically able, recommend initiation of TF.   Initiate Vital VP @ 15 ml/hr via OG/NGT, while at high propofol rate, this would be goal rate.   60 ml Prostat BID.    Tube feeding regimen provides 1394 kcal-634 kcals from sedative-  (100% of needs), 92 grams of protein, and 876 ml of H2O.   NUTRITION DIAGNOSIS:  Inadequate oral intake related to inability to eat as evidenced by NPO status.  GOAL:  Provide needs based on ASPEN/SCCM guidelines  MONITOR:  Vent status, Labs, TF tolerance, Diet advancement, I & O's  REASON FOR ASSESSMENT:  Consult Assessment+ Recommendations for tube feeding  ASSESSMENT:  70 y/o female who moved from [name] 3 weeks ago. Presented with  Persistent and increasing L neck swelling x2-3 months that is now affecting tongue. Related to goiter.  Also increased leg pain + swelling.  Developed worsening mental status. Found to be acidotic and hypercarbic. Intubated for tx of respiratory failure.   Pt intubated/sedated. No historians present.   Abdomen-soft non distended  Only consulted for recommendations at this time  NFPE: WDL  Patient is currently intubated on ventilator support MV: 10.7 L/min Temp (24hrs), Avg:99 F (37.2 C), Min:98.8 F (37.1 C), Max:99.1 F (37.3 C)  Propofol: 24 ml/hr = 634 kcal/day  Labs reviewed: hypochloremia, Hyperglycemia  Diet Order:  Diet NPO time specified Diet NPO time specified  Skin: Ecchymosis, Dry  Last BM:  Unknown  Height:  Ht Readings from Last 1 Encounters:  02/24/16 5' (1.524 m)   Weight:  Wt Readings from Last 1 Encounters:  02/25/16 233 lb 7.5 oz (105.9 kg)   Ideal Body Weight:  45.45 kg  BMI:  Body mass index is 45.6 kg/(m^2).  Estimated Nutritional Needs:  Kcal:  [id number] kcal Protein:  >91 g Pro (2 g/kg ibw) Fluid:  >1.5 liters  EDUCATION NEEDS:  No  education needs identified at this time  Christophe LouisNathan Franks RD, LDN Nutrition Pager: [phone] 02/25/2016 10:13 AM

## 2016-02-26 ENCOUNTER — Inpatient Hospital Stay (HOSPITAL_COMMUNITY): Payer: Medicaid Other

## 2016-02-26 DIAGNOSIS — E049 Nontoxic goiter, unspecified: Secondary | ICD-10-CM

## 2016-02-26 DIAGNOSIS — J9601 Acute respiratory failure with hypoxia: Secondary | ICD-10-CM

## 2016-02-26 DIAGNOSIS — R0600 Dyspnea, unspecified: Secondary | ICD-10-CM

## 2016-02-26 LAB — CBC
HEMATOCRIT: 35.2 % — AB (ref 36.0–46.0)
Hemoglobin: 10.8 g/dL — ABNORMAL LOW (ref 12.0–15.0)
MCH: 25.2 pg — AB (ref 26.0–34.0)
MCHC: 30.7 g/dL (ref 30.0–36.0)
MCV: 82.1 fL (ref 78.0–100.0)
PLATELETS: 234 10*3/uL (ref 150–400)
RBC: 4.29 MIL/uL (ref 3.87–5.11)
RDW: 17.1 % — ABNORMAL HIGH (ref 11.5–15.5)
WBC: 9.7 10*3/uL (ref 4.0–10.5)

## 2016-02-26 LAB — MAGNESIUM: Magnesium: 1.3 mg/dL — ABNORMAL LOW (ref 1.7–2.4)

## 2016-02-26 LAB — GLUCOSE, CAPILLARY: GLUCOSE-CAPILLARY: 71 mg/dL (ref 65–99)

## 2016-02-26 LAB — BASIC METABOLIC PANEL
ANION GAP: 12 (ref 5–15)
BUN: 23 mg/dL — AB (ref 6–20)
CALCIUM: 7.5 mg/dL — AB (ref 8.9–10.3)
CO2: 29 mmol/L (ref 22–32)
Chloride: 98 mmol/L — ABNORMAL LOW (ref 101–111)
Creatinine, Ser: 0.97 mg/dL (ref 0.44–1.00)
GFR calc Af Amer: >60 mL/min (ref >60)
GFR, EST NON AFRICAN AMERICAN: 59 mL/min — AB (ref >60)
GLUCOSE: 88 mg/dL (ref 65–99)
POTASSIUM: 2.7 mmol/L — AB (ref 3.5–5.1)
Sodium: 139 mmol/L (ref 135–145)

## 2016-02-26 LAB — ECHOCARDIOGRAM COMPLETE
HEIGHTINCHES: 60 in
WEIGHTICAEL: 3686.09 [oz_av]

## 2016-02-26 LAB — PHOSPHORUS: Phosphorus: 2.8 mg/dL (ref 2.5–4.6)

## 2016-02-26 MED ORDER — HYDRALAZINE HCL 20 MG/ML IJ SOLN
10.0000 mg | INTRAMUSCULAR | Status: DC | PRN
  Administered 2016-02-27: 20 mg via INTRAVENOUS
  Administered 2016-02-28: 10 mg via INTRAVENOUS
  Filled 2016-02-26 (×2): qty 1

## 2016-02-26 MED ORDER — POTASSIUM CHLORIDE 20 MEQ/15ML (10%) PO SOLN
40.0000 meq | Freq: Once | ORAL | Status: AC
Start: 2016-02-26 — End: 2016-02-26
  Administered 2016-02-26: 40 meq
  Filled 2016-02-26: qty 30

## 2016-02-26 MED ORDER — POTASSIUM CHLORIDE 10 MEQ/100ML IV SOLN
10.0000 meq | INTRAVENOUS | Status: AC
  Administered 2016-02-26 (×4): 10 meq via INTRAVENOUS
  Filled 2016-02-26 (×4): qty 100

## 2016-02-26 MED ORDER — SODIUM CHLORIDE 0.9 % IV BOLUS (SEPSIS)
1000.0000 mL | Freq: Once | INTRAVENOUS | Status: AC
  Administered 2016-02-26: 1000 mL via INTRAVENOUS

## 2016-02-26 MED ORDER — MAGNESIUM SULFATE 4 GM/100ML IV SOLN
4.0000 g | Freq: Once | INTRAVENOUS | Status: AC
  Administered 2016-02-26: 4 g via INTRAVENOUS
  Filled 2016-02-26: qty 100

## 2016-02-26 MED ORDER — PERFLUTREN LIPID MICROSPHERE
2.0000 mL | INTRAVENOUS | Status: AC | PRN
  Administered 2016-02-26: 2 mL via INTRAVENOUS
  Filled 2016-02-26: qty 10

## 2016-02-26 NOTE — Progress Notes (Signed)
eLink Physician-Brief Progress Note Patient Name: Kendra Osborne DOB: 08/06/1946 MRN: [id number]   Date of Service  02/26/2016  HPI/Events of Note  Oliguria per RN   Recent Labs Lab 02/24/16 2000 02/25/16 0323  CREATININE 1.20* 1.21*      eICU Interventions  1L fluid bolus Await echo     Intervention Category Intermediate Interventions: Oliguria - evaluation and management  Estha Few 02/26/2016, 2:37 AM

## 2016-02-26 NOTE — Progress Notes (Signed)
eLink Physician-Brief Progress Note Patient Name: Juliane Pootaseem Diviney DOB: 10/11/1946 MRN: [id number]   Date of Service  02/26/2016  HPI/Events of Note   Recent Labs Lab 02/24/16 2000 02/25/16 0323 02/26/16 0410  NA 140  --  139  K 4.2  --  2.7*  CL 97*  --  98*  CO2 33*  --  29  GLUCOSE 140*  --  88  BUN 33*  --  23*  CREATININE 1.20* 1.21* 0.97  CALCIUM 8.7*  --  7.5*  MG  --  1.7 1.3*  PHOS  --  4.8* 2.8    I/O last 3 completed shifts: In: 860.7 [I.V.:560.7; IV Piggyback:300] Out: 2555 [Urine:2305; Emesis/NG output:250]   eICU Interventions  Ur op has improved with fluids K is low Mag is low  Plan Repeat 1L fluid bolus Replete mag and k     Intervention Category Major Interventions: Electrolyte abnormality - evaluation and management  Milicent Acheampong 02/26/2016, 5:18 AM

## 2016-02-26 NOTE — Progress Notes (Signed)
  Echocardiogram 2D Echocardiogram has been performed.  Janalyn HarderWest, Anjalee Cope R 02/26/2016, 3:11 PM

## 2016-02-26 NOTE — Progress Notes (Signed)
PULMONARY / CRITICAL CARE MEDICINE HISTORY AND PHYSICAL EXAMINATION   Name: Vidya Bamford MRN: [id number] DOB: Jun 28, 1946    ADMISSION DATE:  02/24/2016  PRIMARY SERVICE: PCCM  CHIEF COMPLAINT:  Dyspnea  BRIEF PATIENT DESCRIPTION: 70 y/o woman recently moved from [place] who presents with respiratory distress.  SIGNIFICANT EVENTS / STUDIES:  CT-A of Chest >> mild mediastinal enlargement CT of the Neck with contrast >> multinodular goiter, meningioma  LINES / TUBES: 7.44mm OETT >> 3/17 PIV x2 >> 3/18 OGT >> 3/18 Foley  >> 3/18  CULTURES: Blood (pending) Sputum (pending)  ANTIBIOTICS: Azithro >> CTX >>  HISTORY OF PRESENT ILLNESS:   Ms. Morro is a 70 y/o woman who presented to the ED on 3/17 with complaints of dyspnea, and lower extremity edema. History is limited due to language / culture barriers, but she has a PMHx of a known goiter that has been present for years, as well as possibly hypertension. It is not clear if the goiter was ever worked up or if she ever was on regular therapy for her hypertension. Her Nephew and son (who speaks little [place]) report that she been having a cough for a few days as well as some fevers and chills.  SUBJECTIVE:    VITAL SIGNS: Temp:  [97.5 F (36.4 C)-99.5 F (37.5 C)] 97.5 F (36.4 C) (03/19 0800) Pulse Rate:  [60-88] 67 (03/19 0800) Resp:  [12-28] 28 (03/19 0800) BP: (134-181)/(59-94) 161/82 mmHg (03/19 0800) SpO2:  [95 %-100 %] 98 % (03/19 0800) FiO2 (%):  [40 %] 40 % (03/19 0800) Weight:  [104.5 kg (230 lb 6.1 oz)] 104.5 kg (230 lb 6.1 oz) (03/19 0259) HEMODYNAMICS:   VENTILATOR SETTINGS: Vent Mode:  [-] PRVC FiO2 (%):  [40 %] 40 % Set Rate:  [28 bmp] 28 bmp Vt Set:  [380 mL] 380 mL PEEP:  [5 cmH20] 5 cmH20 Plateau Pressure:  [18 cmH20-24 cmH20] 22 cmH20 INTAKE / OUTPUT: Intake/Output      03/18 0701 - 03/19 0700 03/19 0701 - 03/20 0700   I.V. (mL/kg) 676.7 (6.5) 35.9 (0.3)   Other 20    IV Piggyback 1600 200    Total Intake(mL/kg) 2296.7 (22) 235.9 (2.3)   Urine (mL/kg/hr) 1333 (0.5) 35 (0.2)   Emesis/NG output     Total Output 1333 35   Net +963.7 +200.9          PHYSICAL EXAMINATION: General:  Obese woman, intubated Neuro:  Sedated HEENT: Moist mucus membranes, OETT in place. Neck: Firm, palpable goiter. Cardiovascular: Heart sounds dual and normal. Lungs:  Mechanical Breath Sounds Abdomen:  Obese, soft Musculoskeletal:  No swollen joints Skin:  No rashes on exposed skin  LABS:  CBC  Recent Labs Lab 02/24/16 2000 02/25/16 0323 02/26/16 0410  WBC 7.8 9.3 9.7  HGB 12.0 11.2* 10.8*  HCT 40.2 38.4 35.2*  PLT 280 232 234   Coag's No results for input(s): APTT, INR in the last 168 hours. BMET  Recent Labs Lab 02/24/16 2000 02/25/16 0323 02/26/16 0410  NA 140  --  139  K 4.2  --  2.7*  CL 97*  --  98*  CO2 33*  --  29  BUN 33*  --  23*  CREATININE 1.20* 1.21* 0.97  GLUCOSE 140*  --  88   Electrolytes  Recent Labs Lab 02/24/16 2000 02/25/16 0323 02/26/16 0410  CALCIUM 8.7*  --  7.5*  MG  --  1.7 1.3*  PHOS  --  4.8* 2.8  Sepsis Markers No results for input(s): LATICACIDVEN, PROCALCITON, O2SATVEN in the last 168 hours. ABG  Recent Labs Lab 02/24/16 2249 02/25/16 0048 02/25/16 0336  PHART 7.176* 7.298* 7.435  PCO2ART 95.7* 75.7* 52.4*  PO2ART 93.0 127.0* 71.0*   Liver Enzymes No results for input(s): AST, ALT, ALKPHOS, BILITOT, ALBUMIN in the last 168 hours. Cardiac Enzymes  Recent Labs Lab 02/24/16 2000  TROPONINI 0.14*   Glucose No results for input(s): GLUCAP in the last 168 hours.  Imaging Ct Soft Tissue Neck W Contrast  02/25/2016  CLINICAL DATA:  Initial evaluation for left neck swelling. EXAM: CT NECK WITH CONTRAST TECHNIQUE: Multidetector CT imaging of the neck was performed using the standard protocol following the bolus administration of intravenous contrast. CONTRAST:  100 cc of Omni 350. COMPARISON:  None. FINDINGS: 2.6 x 1.8 x 2.4  cm meningioma present at the right sphenoid wing. No significant edema. Partially visualized portions of the brain otherwise unremarkable. Globes and orbits demonstrate no acute abnormality. Sequela of lens extraction noted on the left. Scattered mucosal thickening within the ethmoidal air cells. Mild mucosal thickening within the right maxillary sinus. No air-fluid level to suggest active sinus infection. Mastoid air cells clear. Middle ear cavities well pneumatized. Salivary glands including the parotid glands and submandibular glands within normal limits. Oral cavity demonstrates no acute abnormality. Patient is intubated with enteric tube in place. Tip of the endotracheal tube position in medially at the carina. Poor dentition noted. No acute abnormality about the dentition. Palatine tonsils grossly normal. Parapharyngeal fat preserved. Fluid within the nasopharynx. No retropharyngeal fluid collection. Epiglottis and vallecula not well evaluated due to presence of endotracheal tube. Remainder of the hypopharynx and supraglottic larynx grossly unremarkable, but also not well evaluated due to intubation. True cords not well seen. Subglottic airway relatively clear other than the endotracheal tube. Mildly prominent right supraclavicular node measuring 17 mm in short axis, of uncertain significance. No other adenopathy within the neck. Thyroid gland is markedly enlarged and heterogeneous in appearance, with asymmetric enlargement of the left thyroid lobe. Findings most consistent with multinodular goiter. Presumably this is the palpable area of concern. No significant inflammatory changes. The visualized superior mediastinum demonstrates no acute abnormality. Shotty mediastinal lymph nodes noted. The partially visualized lungs are grossly clear. Normal intravascular enhancement seen throughout the neck. Carotid arteries are displaced by the enlarged thyroid. No acute osseous abnormality. No worrisome lytic or  blastic osseous lesions. IMPRESSION: 1. Markedly enlarged and heterogeneous thyroid gland, with the left lobe of thyroid larger than the right. Findings most consistent with multinodular goiter. Query whether this represents the palpable area of concern. Correlation with physical exam recommended. 2. No other acute abnormality identified within the neck. Poor dentition without acute inflammatory changes. 3. Prominent 17 mm right supraclavicular lymph node, of uncertain significance. No other adenopathy within the neck. 4. Endotracheal tube in place with tip in medially at the carina. Retraction by 2 cm suggested to ovoid slippage into the mainstem bronchi. 5. 2.6 x 1.8 x 2.4 cm right sphenoid wing meningioma without associated edema. Electronically Signed   By: Rise MuBenjamin  McClintock M.D.   On: 02/25/2016 00:47   Ct Angio Chest Pe W/cm &/or Wo Cm  02/25/2016  CLINICAL DATA:  Hypoxia. EXAM: CT ANGIOGRAPHY CHEST WITH CONTRAST TECHNIQUE: Multidetector CT imaging of the chest was performed using the standard protocol during bolus administration of intravenous contrast. Multiplanar CT image reconstructions and MIPs were obtained to evaluate the vascular anatomy. CONTRAST:  100 mL Omnipaque 350  intravenous COMPARISON:  Radiographs 02/24/2016 FINDINGS: Cardiovascular: There is good opacification of the pulmonary arteries. There is no pulmonary embolism. The thoracic aorta is normal in caliber and intact. There is calcified coronary artery plaque. Lungs: Patchy opacities in the posterior bases, right greater than left. This may be atelectatic. Central airways: Endotracheal tube extends to 1 cm above the carina. Central airways are patent. Effusions: None Lymphadenopathy: Nonspecific nodes in the mediastinum and hila. There is marked thyroid enlargement extending into the upper chest. Esophagus: Unremarkable Upper abdomen: Multiple calculi within the gallbladder. Musculoskeletal: No significant abnormality Review of the MIP  images confirms the above findings. IMPRESSION: 1. Negative for acute pulmonary embolism. 2. Marked thyroid enlargement extending into the chest. 3. Atelectatic appearing basilar opacities, right greater than left. 4. Nonspecific nodes in the mediastinum and hila. 5. Cholelithiasis. Electronically Signed   By: Ellery Plunk M.D.   On: 02/25/2016 00:27   Dg Chest Port 1 View  02/24/2016  CLINICAL DATA:  Subacute onset of hypoxia, shortness of breath and lower extremity edema. Initial encounter. EXAM: PORTABLE CHEST 1 VIEW COMPARISON:  None. FINDINGS: The lungs are hypoexpanded. Vascular congestion is noted. Mild left basilar atelectasis is seen. There is no evidence of pleural effusion or pneumothorax. The cardiomediastinal silhouette is mildly enlarged. No acute osseous abnormalities are seen. IMPRESSION: Lungs hypoexpanded, with mild left basilar atelectasis. Vascular congestion and mild cardiomegaly. Electronically Signed   By: Roanna Raider M.D.   On: 02/24/2016 20:16   Dg Abd Portable 1v  02/25/2016  CLINICAL DATA:  Evaluate tube placement EXAM: PORTABLE ABDOMEN - 1 VIEW COMPARISON:  None. FINDINGS: An NG tube terminates in the right lower abdomen, just to the right of midline. The tube probably terminates in the distal stomach. No other acute abnormalities. IMPRESSION: The NG tube is thought to terminate in the distal stomach. Electronically Signed   By: Gerome Sam III M.D   On: 02/25/2016 07:59   US Thyroid  02/25/2016  CLINICAL DATA:  Goiter. EXAM: THYROID ULTRASOUND TECHNIQUE: Ultrasound examination of the thyroid gland and adjacent soft tissues was performed. COMPARISON:  CT scan February 24, 2016 FINDINGS: Right thyroid lobe Measurements: 10.6 x 4.6 x 4.4 cm. Markedly heterogeneous with no discrete measured nodules. Left thyroid lobe Measurements: 12.3 x 5.6 x 6.0 cm. Markedly heterogeneous with no discrete measured nodules. Isthmus Thickness: 1.9 cm. Markedly heterogeneous with no discrete  measured nodules. Lymphadenopathy None visualized. IMPRESSION: Enlarged markedly heterogeneous thyroid with no discrete nodules identified. Electronically Signed   By: Gerome Sam III M.D   On: 02/25/2016 08:25    EKG: NSR CXR: Chest CT reviewed and showed significant proximal airway stenosis associated with multinodular goiter.  ASSESSMENT / PLAN:  Active Problems:   Respiratory failure (HCC)   PULMONARY A: Acute on chronic Hypercarbic Respiratory Failure Acute Hypoxic Respiratory Failure Threatened airway to to extrinsic compression P:   Unclear etiology of hypoxia; suspect multifactorial, largest contribution from goiter. Consider viral bronchitis / pneumonitis, some component of pulm edema.  Diuresed empirically Empiric abx for CAP, although no definite infiltrate on CXR Per history, goiter is chronic, but may swell given large Iodine load from IV contrast.  Maintain on PRVC. Will consult ENT on 3/20, will need to plan a possible extubation in OR with prep for possible trach   CARDIOVASCULAR A: HTN P:   Will treat with PRN hydralazine and/or labetalol IV for BP control.  RENAL A: Renal insufficiency, improved P:   Follow BMP  GASTROINTESTINAL A: Obesity  SUP P:   PPI  HEMATOLOGIC A: No active issues P:   DVT PPX  INFECTIOUS A: Consider CAP, doubt P:   CAP coverage for now, consider d/c 3/20. No leukocytosis or secretions noted today  ENDOCRINE A: Multinodular Goiter P:   Normal TSH  NEUROLOGIC A: Need for sedation P:   Will treat with propofol  Family: none at bedside 3/19   Independent CC time 35 minutes   Levy Pupa, MD, PhD 02/26/2016, 8:50 AM [facility] [phone] or if no answer [phone]

## 2016-02-27 ENCOUNTER — Inpatient Hospital Stay (HOSPITAL_COMMUNITY): Payer: Medicaid Other

## 2016-02-27 DIAGNOSIS — J9602 Acute respiratory failure with hypercapnia: Secondary | ICD-10-CM

## 2016-02-27 LAB — CBC
HCT: 39.5 % (ref 36.0–46.0)
HEMOGLOBIN: 12.1 g/dL (ref 12.0–15.0)
MCH: 25.1 pg — AB (ref 26.0–34.0)
MCHC: 30.6 g/dL (ref 30.0–36.0)
MCV: 82 fL (ref 78.0–100.0)
PLATELETS: 241 10*3/uL (ref 150–400)
RBC: 4.82 MIL/uL (ref 3.87–5.11)
RDW: 17.4 % — AB (ref 11.5–15.5)
WBC: 10.2 10*3/uL (ref 4.0–10.5)

## 2016-02-27 LAB — MAGNESIUM: MAGNESIUM: 2 mg/dL (ref 1.7–2.4)

## 2016-02-27 LAB — PHOSPHORUS: PHOSPHORUS: 3.8 mg/dL (ref 2.5–4.6)

## 2016-02-27 LAB — BASIC METABOLIC PANEL
ANION GAP: 11 (ref 5–15)
BUN: 18 mg/dL (ref 6–20)
CALCIUM: 7.6 mg/dL — AB (ref 8.9–10.3)
CO2: 25 mmol/L (ref 22–32)
CREATININE: 0.93 mg/dL (ref 0.44–1.00)
Chloride: 101 mmol/L (ref 101–111)
GFR calc Af Amer: >60 mL/min (ref >60)
GLUCOSE: 85 mg/dL (ref 65–99)
Potassium: 3.3 mmol/L — ABNORMAL LOW (ref 3.5–5.1)
Sodium: 137 mmol/L (ref 135–145)

## 2016-02-27 LAB — GLUCOSE, CAPILLARY
GLUCOSE-CAPILLARY: 77 mg/dL (ref 65–99)
Glucose-Capillary: 77 mg/dL (ref 65–99)
Glucose-Capillary: 95 mg/dL (ref 65–99)

## 2016-02-27 LAB — TYPE AND SCREEN
ABO/RH(D): A POS
ANTIBODY SCREEN: NEGATIVE

## 2016-02-27 MED ORDER — VITAL HIGH PROTEIN PO LIQD: 1000.0000 mL | ORAL | Status: DC

## 2016-02-27 MED ORDER — PRO-STAT SUGAR FREE PO LIQD
60.0000 mL | Freq: Three times a day (TID) | ORAL | Status: DC
  Administered 2016-02-27 – 2016-03-01 (×5): 60 mL
  Filled 2016-02-27 (×5): qty 60

## 2016-02-27 MED ORDER — POTASSIUM CHLORIDE 20 MEQ/15ML (10%) PO SOLN
40.0000 meq | Freq: Once | ORAL | Status: AC
  Administered 2016-02-27: 40 meq
  Filled 2016-02-27: qty 30

## 2016-02-27 MED ORDER — VITAL HIGH PROTEIN PO LIQD
1000.0000 mL | ORAL | Status: DC
  Administered 2016-02-27: 1000 mL

## 2016-02-27 MED ORDER — CLINDAMYCIN PHOSPHATE 900 MG/50ML IV SOLN
900.0000 mg | INTRAVENOUS | Status: AC
  Administered 2016-02-28: 900 mg via INTRAVENOUS
  Filled 2016-02-27: qty 50

## 2016-02-27 NOTE — H&P (Signed)
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 Also had neck mass and CT neck that I reviewed showed a right 10cm, left 12cm thyroid goiter with US showing no discrete nodules. ENT was consulted for teh above. She was intubated 02/24/16 in the ER for respiratory distress/desat's.  Allergies: Not on File  ROS: Review of systems unable to obtain x 12 systems as patient is intubated.  PMH:  Past Medical History  Diagnosis Date  . Hypertension     FH: No family history on file.  SH:  Social History   Social History  . Marital Status: Widowed    Spouse Name: N/A  . Number of Children: N/A  . Years of Education: N/A   Occupational History  . Not on file.   Social History Main Topics  . Smoking status: Never Smoker   . Smokeless tobacco: Not on file  . Alcohol Use: No  . Drug Use: No  . Sexual Activity: Not on file   Other Topics Concern  . Not on file   Social History Narrative  . No narrative on file    PSH: History reviewed. No pertinent past surgical history.  Physical  Exam: unable to assess CN 2-12 as patient intubated and sedated. EAC/TMs normal BL. Oral cavity, lips, gums, ororpharynx normal other than ET tube in place and secure. Skin warm and dry. Nasal cavity without polyps or purulence. External nose and ears without masses or lesions.  Neck with significant adipose tissue and a palpable bilateral left greater than right enlarged thyroid consistent with the goiter seen on CT scan and ultrasound. Sternal notch is palpable but trachea is not palpable due to the goiter. No obvious  lymphadenopathy palpated.  A/P: significantly enlarged bilateral goiter without focal nodules  on imaging. I discussed thus at length with her family and the goiter is likely contributing to her respiratory symptoms/compressive symptoms. The thyroid goiter needs to be addressed as continued growth will make excision more difficult. I discussed with the family that the risk of recurrent laryngeal nerve injury and parathyroid removal/hypocalcemia is much higher due to the size of the goiter, and I discussed risks including recurrent laryngeal nerve injury, hoarseness, hematoma/bleeding, infection, need for permanent thyroid hormone supplementation, need for temporary or permanent calcium and vitamin D supplementation, hypocalcemia, vascular injury, need for tracheotomy, etc. These risks may be lessened by intracapsular thyroidectomy since malignancy is not suspected. The family understood the risks and agreed to proceed with the surgery. INformed written consent was obtained from patient's son, Kendra Osborne, and is signed, witnessed, and on chart. WIll make NPO/hold tube feeds and Heparin SQ and scheduled for total thyroidectomy 02/28/2016.   Kendra Osborne, Kendra Osborne 02/27/2016 5:31 PM

## 2016-02-27 NOTE — Progress Notes (Signed)
PULMONARY / CRITICAL CARE MEDICINE    Name: Kendra Osborne MRN: [id number] DOB: July 03, 1946    ADMISSION DATE:  02/24/2016  PRIMARY SERVICE: PCCM  CHIEF COMPLAINT:  Dyspnea  BRIEF PATIENT DESCRIPTION: 70 y/o woman recently moved from [place] who presents with respiratory distress.  SIGNIFICANT EVENTS / STUDIES:  CT-A of Chest >> mild mediastinal enlargement CT of the Neck with contrast >> multinodular goiter, meningioma  LINES / TUBES: 7.89mm OETT >> 3/17 PIV x2 >> 3/18 OGT >> 3/18 Foley  >> 3/18  CULTURES: Blood 3/18 >> Sputum   ANTIBIOTICS: Azithro >> CTX >>  HISTORY OF PRESENT ILLNESS:   Kendra Osborne is a 70 y/o woman who presented to the ED on 3/17 with complaints of dyspnea, and lower extremity edema. History is limited due to language / culture barriers, but she has a PMHx of a known goiter that has been present for years, as well as possibly hypertension. It is not clear if the goiter was ever worked up or if she ever was on regular therapy for her hypertension. Her Nephew and son (who speaks little [place]) report that she been having a cough for a few days as well as some fevers and chills.  SUBJECTIVE:  Afebrile Poor UO  VITAL SIGNS: Temp:  [96.6 F (35.9 C)-98.1 F (36.7 C)] 97.9 F (36.6 C) (03/20 0400) Pulse Rate:  [59-105] 85 (03/20 0700) Resp:  [11-30] 30 (03/20 0700) BP: (103-194)/(49-104) 135/54 mmHg (03/20 0700) SpO2:  [96 %-100 %] 100 % (03/20 0700) FiO2 (%):  [40 %] 40 % (03/20 0306) Weight:  [231 lb 11.3 oz (105.1 kg)] 231 lb 11.3 oz (105.1 kg) (03/20 0422) HEMODYNAMICS:   VENTILATOR SETTINGS: Vent Mode:  [-] PRVC FiO2 (%):  [40 %] 40 % Set Rate:  [28 bmp] 28 bmp Vt Set:  [380 mL] 380 mL PEEP:  [5 cmH20] 5 cmH20 Plateau Pressure:  [19 cmH20-21 cmH20] 21 cmH20 INTAKE / OUTPUT: Intake/Output      03/19 0701 - 03/20 0700 03/20 0701 - 03/21 0700   I.V. (mL/kg) 727.3 (6.9)    Other     IV Piggyback 600    Total Intake(mL/kg) 1327.3 (12.6)     Urine (mL/kg/hr) 763 (0.3)    Total Output 763     Net +564.3            PHYSICAL EXAMINATION: General:  Obese woman, intubated Neuro:  Sedated, RASS-3 HEENT: Moist mucus membranes, OETT in place. Neck: Firm, palpable goiter. Cardiovascular: Heart sounds dual and normal. Lungs:  Mechanical Breath Sounds Abdomen:  Obese, soft Musculoskeletal:  No swollen joints Skin:  No rashes on exposed skin  LABS:  CBC  Recent Labs Lab 02/25/16 0323 02/26/16 0410 02/27/16 0254  WBC 9.3 9.7 10.2  HGB 11.2* 10.8* 12.1  HCT 38.4 35.2* 39.5  PLT 232 234 241   Coag's No results for input(s): APTT, INR in the last 168 hours. BMET  Recent Labs Lab 02/24/16 2000 02/25/16 0323 02/26/16 0410 02/27/16 0254  NA 140  --  139 137  K 4.2  --  2.7* 3.3*  CL 97*  --  98* 101  CO2 33*  --  29 25  BUN 33*  --  23* 18  CREATININE 1.20* 1.21* 0.97 0.93  GLUCOSE 140*  --  88 85   Electrolytes  Recent Labs Lab 02/24/16 2000 02/25/16 0323 02/26/16 0410 02/27/16 0254  CALCIUM 8.7*  --  7.5* 7.6*  MG  --  1.7 1.3* 2.0  PHOS  --  4.8* 2.8 3.8   Sepsis Markers No results for input(s): LATICACIDVEN, PROCALCITON, O2SATVEN in the last 168 hours. ABG  Recent Labs Lab 02/24/16 2249 02/25/16 0048 02/25/16 0336  PHART 7.176* 7.298* 7.435  PCO2ART 95.7* 75.7* 52.4*  PO2ART 93.0 127.0* 71.0*   Liver Enzymes No results for input(s): AST, ALT, ALKPHOS, BILITOT, ALBUMIN in the last 168 hours. Cardiac Enzymes  Recent Labs Lab 02/24/16 2000  TROPONINI 0.14*   Glucose  Recent Labs Lab 02/26/16 1516  GLUCAP 71    Imaging Dg Chest Port 1 View  02/27/2016  CLINICAL DATA:  Acute respiratory failure EXAM: PORTABLE CHEST 1 VIEW COMPARISON:  Portable chest x-ray of February 26, 2016 FINDINGS: The lungs are mildly hypoinflated. The interstitial markings remain coarse bilaterally. The left hemidiaphragm remains partially obscured. The cardiac silhouette remains enlarged. The central pulmonary  vascularity is engorged. The endotracheal tube tip lies approximately 2.3 cm above the carina. The esophagogastric tube tip projects below the inferior margin of the image. IMPRESSION: Stable hypoinflation with left lower lobe atelectasis or pneumonia. Stable mild cardiomegaly and central pulmonary vascular congestion. The support tubes are in reasonable position. Electronically Signed   By: David  [place] M.D.   On: 02/27/2016 07:23   Dg Chest Port 1 View  02/26/2016  CLINICAL DATA:  Acute respiratory failure EXAM: PORTABLE CHEST 1 VIEW COMPARISON:  02/24/2016 chest radiograph. FINDINGS: Endotracheal tube tip is 2.7 cm above the carina. Enteric tube enters stomach with the tip not seen on this image. Stable cardiomediastinal silhouette with mild cardiomegaly. No pneumothorax. No pleural effusion. Low lung volumes. No overt pulmonary edema. Patchy bibasilar lung opacities appear slightly increased bilaterally. IMPRESSION: 1. Well-positioned support hardware as described. 2. Stable mild cardiomegaly without overt pulmonary edema. 3. Low lung volumes with increased patchy bibasilar lung opacities, either atelectasis, aspiration or pneumonia. Electronically Signed   By: Delbert Phenix M.D.   On: 02/26/2016 09:05    EKG: NSR CXR: Chest CT reviewed and showed significant proximal airway stenosis associated with multinodular goiter.  ASSESSMENT / PLAN:  Active Problems:   Respiratory failure (HCC)   PULMONARY A: Acute on chronic Hypercarbic Respiratory Failure Acute Hypoxic Respiratory Failure Threatened airway to to extrinsic compression from large goiter Unclear etiology of hypoxia; suspect multifactorial, largest contribution from goiter. Consider viral bronchitis / pneumonitis, some component of pulm edema.  P:   Empiric abx for CAP, although no definite infiltrate on CXR SBTs. Will consult ENT, will need to plan a possible extubation in OR with prep for possible trach    CARDIOVASCULAR A: HTN Echo 3/19 nml LV fn, gr 1 DD P:   Will treat with PRN hydralazine and/or labetalol IV for BP control.  RENAL A: Renal insufficiency, improved P:   Follow BMP, replete lytes as needed  GASTROINTESTINAL A: Obesity SUP P:   PPI  HEMATOLOGIC A: No active issues P:   DVT PPX  INFECTIOUS A: Consider CAP, doubt P:   CAP coverage for now, d/c 3/20.  No leukocytosis or secretions noted today  ENDOCRINE A: Multinodular Goiter P:   Normal TSH  NEUROLOGIC A: Need for sedation P:    propofol gt fent prn RASS goal -1  Family: none at bedside 3/19  The patient is critically ill with multiple organ systems failure and requires high complexity decision making for assessment and support, frequent evaluation and titration of therapies, application of advanced monitoring technologies and extensive interpretation of multiple databases. Critical Care Time devoted to patient  care services described in this note independent of APP time is 35 minutes.    Cyril Mourningakesh Alva MD. Tonny BollmanFCCP. [facility] Pager [phone] If no response call [phone]   02/27/2016    02/27/2016, 8:42 AM

## 2016-02-27 NOTE — Clinical Documentation Improvement (Signed)
Critical Care  Can the diagnosis of Renal Insufficiency be further specified?    CKD Stage I - GFR greater than or equal to 90  CKD Stage II - GFR 60-89  CKD Stage III - GFR 30-59  CKD Stage IV - GFR 15-29  CKD Stage V - GFR < 15  ESRD (End Stage Renal Disease)  Acute Renal Failure  Other condition  Unable to clinically determine   Supporting Information: : (risk factors, signs and symptoms, diagnostics, treatment) Renal insufficiency per 3/18 progress notes. Labs:  Bun     Creat      GFR: 3/20:   18         0.93       >60 3/19:   23         0.97         59 3/17:   33         1.20         45   Please exercise your independent, professional judgment when responding. A specific answer is not anticipated or expected.   Thank Sabino DonovanYou, Destanie Tibbetts [facility] [phone]

## 2016-02-27 NOTE — Progress Notes (Signed)
eLink Physician-Brief Progress Note Patient Name: Juliane Pootaseem Gerety DOB: 08/10/1946 MRN: [id number]   Date of Service  02/27/2016  HPI/Events of Note  Notified by bedside nurse of oliguria. Approximately 80 mL of urine output over the last 4 hours.Patient's BP is stable. Renal function slowly improving on serum labs. Nondistended bladder on Bladder scan.  eICU Interventions  1. Continue to monitor urine output & renal function     Intervention Category Intermediate Interventions: Oliguria - evaluation and management  Lawanda CousinsJennings Arley Garant 02/27/2016, 2:16 AM

## 2016-02-27 NOTE — Clinical Documentation Improvement (Signed)
Critical Care  Abnormal Lab/Test Results:   Potassium: 3/19:  2.7. 3/20:  3.3.  Possible Clinical Conditions associated with below indicators  Hypokalemia  Other Condition  Cannot Clinically Determine   Treatment Provided: 3/19:  Potassium chloride 10 meq/100 ml solution x3 per flowsheets 3/19:  Potassium chloride 20 meq/15 ml (10%) solution per flowsheets.   Please exercise your independent, professional judgment when responding. A specific answer is not anticipated or expected.   Thank Sabino DonovanYou,  Alexsis Branscom [facility] [place] [phone]

## 2016-02-27 NOTE — Progress Notes (Signed)
No cuff leak noted. MD aware.

## 2016-02-27 NOTE — Progress Notes (Signed)
Nutrition Follow-up / Consult  DOCUMENTATION CODES:   Morbid obesity  INTERVENTION:    Initiate TF via OGT with Vital High Protein at goal rate of 10 ml/h with Prostat 60 ml TID to provide 840 kcals, 111 gm protein, 201 ml free water daily.  Total intake with Propofol and TF will be 1511 kcals per day (103% of estimated needs).  NUTRITION DIAGNOSIS:   Inadequate oral intake related to inability to eat as evidenced by NPO status.  Ongoing  GOAL:   Provide needs based on ASPEN/SCCM guidelines  Unmet  MONITOR:   Vent status, Labs, TF tolerance, Diet advancement, I & O's  REASON FOR ASSESSMENT:   Consult Enteral/tube feeding initiation and management  ASSESSMENT:   70 y/o woman [place] moved from [name] who presents with respiratory distress. 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  Labs reviewed: potassium is low.  Received MD Consult for TF initiation and management.  Patient is currently intubated on ventilator support Temp (24hrs), Avg:97.9 F (36.6 C), Min:96.6 F (35.9 C), Max:98.3 F (36.8 C)  Propofol: 25.4 ml/hr providing 671 kcals per day.   Diet Order:  Diet NPO time specified  Skin:  Reviewed, no issues  Last BM:  Unknown  Height:   Ht Readings from Last 1 Encounters:  02/24/16 5' (1.524 m)    Weight:   Wt Readings from Last 1 Encounters:  02/27/16 231 lb 11.3 oz (105.1 kg)   02/25/16 233 lb 7.5 oz (105.9 kg)        Ideal Body Weight:  45.45 kg  BMI:  Body mass index is 45.25 kg/(m^2).  Estimated Nutritional Needs:   Kcal:  1155-1470  Protein:  >/= 110 gm  Fluid:  >/= 1.5 L  EDUCATION NEEDS:   No education needs identified at this time  Joaquin CourtsKimberly Harris, RD, LDN, CNSC Pager [phone] After Hours Pager [phone]

## 2016-02-28 ENCOUNTER — Inpatient Hospital Stay (HOSPITAL_COMMUNITY): Payer: Medicaid Other | Admitting: Anesthesiology

## 2016-02-28 ENCOUNTER — Encounter (HOSPITAL_COMMUNITY): Payer: Self-pay | Admitting: Certified Registered Nurse Anesthetist

## 2016-02-28 ENCOUNTER — Inpatient Hospital Stay (HOSPITAL_COMMUNITY): Payer: Medicaid Other

## 2016-02-28 ENCOUNTER — Encounter (HOSPITAL_COMMUNITY)
Admission: EM | Disposition: A | Discharge status: Home Health | Payer: Self-pay | Source: Home / Self Care | Attending: Internal Medicine

## 2016-02-28 DIAGNOSIS — J9600 Acute respiratory failure, unspecified whether with hypoxia or hypercapnia: Secondary | ICD-10-CM

## 2016-02-28 HISTORY — PX: THYROIDECTOMY: SHX17

## 2016-02-28 LAB — BASIC METABOLIC PANEL
ANION GAP: 10 (ref 5–15)
BUN: 25 mg/dL — AB (ref 6–20)
CALCIUM: 7.9 mg/dL — AB (ref 8.9–10.3)
CO2: 26 mmol/L (ref 22–32)
Chloride: 103 mmol/L (ref 101–111)
Creatinine, Ser: 0.86 mg/dL (ref 0.44–1.00)
GFR calc Af Amer: >60 mL/min (ref >60)
GLUCOSE: 100 mg/dL — AB (ref 65–99)
POTASSIUM: 3.6 mmol/L (ref 3.5–5.1)
Sodium: 139 mmol/L (ref 135–145)

## 2016-02-28 LAB — CBC
HCT: 35.5 % — ABNORMAL LOW (ref 36.0–46.0)
HEMOGLOBIN: 10.9 g/dL — AB (ref 12.0–15.0)
MCH: 25.3 pg — ABNORMAL LOW (ref 26.0–34.0)
MCHC: 30.7 g/dL (ref 30.0–36.0)
MCV: 82.6 fL (ref 78.0–100.0)
Platelets: 209 10*3/uL (ref 150–400)
RBC: 4.3 MIL/uL (ref 3.87–5.11)
RDW: 17.6 % — ABNORMAL HIGH (ref 11.5–15.5)
WBC: 8 10*3/uL (ref 4.0–10.5)

## 2016-02-28 LAB — GLUCOSE, CAPILLARY
GLUCOSE-CAPILLARY: 110 mg/dL — AB (ref 65–99)
GLUCOSE-CAPILLARY: 115 mg/dL — AB (ref 65–99)
GLUCOSE-CAPILLARY: 131 mg/dL — AB (ref 65–99)
GLUCOSE-CAPILLARY: 98 mg/dL (ref 65–99)
Glucose-Capillary: 72 mg/dL (ref 65–99)
Glucose-Capillary: 73 mg/dL (ref 65–99)
Glucose-Capillary: 78 mg/dL (ref 65–99)
Glucose-Capillary: 62 mg/dL — ABNORMAL LOW (ref 65–99)

## 2016-02-28 LAB — CALCIUM: CALCIUM: 7.5 mg/dL — AB (ref 8.9–10.3)

## 2016-02-28 LAB — MAGNESIUM
MAGNESIUM: 2 mg/dL (ref 1.7–2.4)
Magnesium: 1.6 mg/dL — ABNORMAL LOW (ref 1.7–2.4)

## 2016-02-28 LAB — ABO/RH: ABO/RH(D): A POS

## 2016-02-28 LAB — TRIGLYCERIDES: TRIGLYCERIDES: 115 mg/dL (ref <150)

## 2016-02-28 LAB — ALBUMIN: ALBUMIN: 2.1 g/dL — AB (ref 3.5–5.0)

## 2016-02-28 LAB — PHOSPHORUS: PHOSPHORUS: 4.1 mg/dL (ref 2.5–4.6)

## 2016-02-28 SURGERY — THYROIDECTOMY
Anesthesia: General | Site: Neck

## 2016-02-28 MED ORDER — LIDOCAINE-EPINEPHRINE 1 %-1:100000 IJ SOLN
INTRAMUSCULAR | Status: AC
  Filled 2016-02-28: qty 1

## 2016-02-28 MED ORDER — DEXTROSE 50 % IV SOLN
25.0000 mL | Freq: Once | INTRAVENOUS | Status: AC
  Administered 2016-02-28: 25 mL via INTRAVENOUS

## 2016-02-28 MED ORDER — PROPOFOL 500 MG/50ML IV EMUL
INTRAVENOUS | Status: DC | PRN
  Administered 2016-02-28: 50 ug/kg/min via INTRAVENOUS

## 2016-02-28 MED ORDER — ROCURONIUM BROMIDE 100 MG/10ML IV SOLN
INTRAVENOUS | Status: DC | PRN
  Administered 2016-02-28: 50 mg via INTRAVENOUS
  Administered 2016-02-28: 20 mg via INTRAVENOUS
  Administered 2016-02-28: 30 mg via INTRAVENOUS

## 2016-02-28 MED ORDER — LIDOCAINE HCL (CARDIAC) 20 MG/ML IV SOLN
INTRAVENOUS | Status: AC
  Filled 2016-02-28: qty 5

## 2016-02-28 MED ORDER — PHENYLEPHRINE 40 MCG/ML (10ML) SYRINGE FOR IV PUSH (FOR BLOOD PRESSURE SUPPORT)
PREFILLED_SYRINGE | INTRAVENOUS | Status: AC
  Filled 2016-02-28: qty 10

## 2016-02-28 MED ORDER — CHLORHEXIDINE GLUCONATE 0.12% ORAL RINSE (MEDLINE KIT): 15.0000 mL | Freq: Two times a day (BID) | OROMUCOSAL | Status: DC

## 2016-02-28 MED ORDER — ROCURONIUM BROMIDE 50 MG/5ML IV SOLN
INTRAVENOUS | Status: AC
  Filled 2016-02-28: qty 1

## 2016-02-28 MED ORDER — FENTANYL CITRATE (PF) 100 MCG/2ML IJ SOLN
INTRAMUSCULAR | Status: DC | PRN
  Administered 2016-02-28: 50 ug via INTRAVENOUS
  Administered 2016-02-28 (×2): 100 ug via INTRAVENOUS

## 2016-02-28 MED ORDER — PROPOFOL 10 MG/ML IV BOLUS
INTRAVENOUS | Status: AC
  Filled 2016-02-28: qty 20

## 2016-02-28 MED ORDER — LACTATED RINGERS IV SOLN
INTRAVENOUS | Status: DC | PRN
  Administered 2016-02-28 (×3): via INTRAVENOUS

## 2016-02-28 MED ORDER — PROPOFOL 1000 MG/100ML IV EMUL
INTRAVENOUS | Status: AC
  Filled 2016-02-28: qty 100

## 2016-02-28 MED ORDER — CEFAZOLIN SODIUM-DEXTROSE 2-3 GM-% IV SOLR
2.0000 g | Freq: Three times a day (TID) | INTRAVENOUS | Status: DC
  Administered 2016-02-28 – 2016-02-29 (×2): 2 g via INTRAVENOUS
  Filled 2016-02-28 (×4): qty 50

## 2016-02-28 MED ORDER — HEMOSTATIC AGENTS (NO CHARGE) OPTIME
TOPICAL | Status: DC | PRN
  Administered 2016-02-28: 1 via TOPICAL

## 2016-02-28 MED ORDER — ANTISEPTIC ORAL RINSE SOLUTION (CORINZ)
7.0000 mL | OROMUCOSAL | Status: DC
  Administered 2016-02-28 – 2016-03-12 (×127): 7 mL via OROMUCOSAL

## 2016-02-28 MED ORDER — DEXTROSE 50 % IV SOLN
INTRAVENOUS | Status: AC
  Filled 2016-02-28: qty 50

## 2016-02-28 MED ORDER — DEXAMETHASONE SODIUM PHOSPHATE 10 MG/ML IJ SOLN
INTRAMUSCULAR | Status: DC | PRN
  Administered 2016-02-28: 10 mg via INTRAVENOUS

## 2016-02-28 MED ORDER — DEXAMETHASONE SODIUM PHOSPHATE 10 MG/ML IJ SOLN
INTRAMUSCULAR | Status: AC
  Filled 2016-02-28: qty 2

## 2016-02-28 MED ORDER — PHENYLEPHRINE HCL 10 MG/ML IJ SOLN
INTRAMUSCULAR | Status: AC
  Filled 2016-02-28: qty 1

## 2016-02-28 MED ORDER — DEXAMETHASONE SODIUM PHOSPHATE 10 MG/ML IJ SOLN
10.0000 mg | Freq: Three times a day (TID) | INTRAMUSCULAR | Status: AC
  Administered 2016-02-29 (×3): 10 mg via INTRAVENOUS
  Filled 2016-02-28 (×3): qty 1

## 2016-02-28 MED ORDER — ONDANSETRON HCL 4 MG/2ML IJ SOLN
INTRAMUSCULAR | Status: AC
  Filled 2016-02-28: qty 2

## 2016-02-28 MED ORDER — FENTANYL CITRATE (PF) 250 MCG/5ML IJ SOLN
INTRAMUSCULAR | Status: AC
  Filled 2016-02-28: qty 5

## 2016-02-28 MED ORDER — SODIUM CHLORIDE 0.9 % IV SOLN
INTRAVENOUS | Status: DC | PRN
  Administered 2016-02-28: 21:00:00 via INTRAVENOUS

## 2016-02-28 MED ORDER — MIDAZOLAM HCL 5 MG/5ML IJ SOLN
INTRAMUSCULAR | Status: DC | PRN
  Administered 2016-02-28: 2 mg via INTRAVENOUS

## 2016-02-28 MED ORDER — ANTISEPTIC ORAL RINSE SOLUTION (CORINZ)
7.0000 mL | OROMUCOSAL | Status: DC
  Administered 2016-02-29: 7 mL via OROMUCOSAL

## 2016-02-28 MED ORDER — CHLORHEXIDINE GLUCONATE 0.12% ORAL RINSE (MEDLINE KIT)
15.0000 mL | Freq: Two times a day (BID) | OROMUCOSAL | Status: DC
  Administered 2016-02-29 – 2016-03-12 (×25): 15 mL via OROMUCOSAL

## 2016-02-28 MED ORDER — LEVOTHYROXINE SODIUM 100 MCG IV SOLR
75.0000 ug | Freq: Every day | INTRAVENOUS | Status: DC
  Administered 2016-02-29 – 2016-03-02 (×3): 75 ug via INTRAVENOUS
  Filled 2016-02-28 (×3): qty 5

## 2016-02-28 MED ORDER — 0.9 % SODIUM CHLORIDE (POUR BTL) OPTIME
TOPICAL | Status: DC | PRN
  Administered 2016-02-28: 1000 mL

## 2016-02-28 MED ORDER — PHENYLEPHRINE HCL 10 MG/ML IJ SOLN
INTRAMUSCULAR | Status: DC | PRN
  Administered 2016-02-28: 80 ug via INTRAVENOUS
  Administered 2016-02-28: 40 ug via INTRAVENOUS
  Administered 2016-02-28: 80 ug via INTRAVENOUS
  Administered 2016-02-28: 40 ug via INTRAVENOUS
  Administered 2016-02-28: 160 ug via INTRAVENOUS
  Administered 2016-02-28: 80 ug via INTRAVENOUS
  Administered 2016-02-28 (×2): 120 ug via INTRAVENOUS

## 2016-02-28 MED ORDER — DEXTROSE 5 % IV SOLN
10.0000 mg | INTRAVENOUS | Status: DC | PRN
  Administered 2016-02-28: 20 ug/min via INTRAVENOUS

## 2016-02-28 MED ORDER — LIDOCAINE-EPINEPHRINE 1 %-1:100000 IJ SOLN
INTRAMUSCULAR | Status: DC | PRN
  Administered 2016-02-28: 10 mL

## 2016-02-28 MED ORDER — MIDAZOLAM HCL 2 MG/2ML IJ SOLN
INTRAMUSCULAR | Status: AC
  Filled 2016-02-28: qty 2

## 2016-02-28 MED ORDER — BACITRACIN ZINC 500 UNIT/GM EX OINT
TOPICAL_OINTMENT | Freq: Three times a day (TID) | CUTANEOUS | Status: DC
  Administered 2016-02-28 – 2016-02-29 (×3): 31.5556 via TOPICAL
  Administered 2016-02-29: 1 via TOPICAL
  Administered 2016-03-01 – 2016-03-02 (×6): 31.5556 via TOPICAL
  Administered 2016-03-03 (×2): 1 via TOPICAL
  Administered 2016-03-03: 31.5556 via TOPICAL
  Administered 2016-03-04 (×2): 1 via TOPICAL
  Administered 2016-03-04 – 2016-03-05 (×4): 31.5556 via TOPICAL
  Administered 2016-03-06 (×2): 1 via TOPICAL
  Administered 2016-03-06 – 2016-03-12 (×16): 31.5556 via TOPICAL
  Administered 2016-03-12: 1 via TOPICAL
  Administered 2016-03-12: 31.5556 via TOPICAL
  Administered 2016-03-13 (×2): 1 via TOPICAL
  Administered 2016-03-13: 22:00:00 via TOPICAL
  Administered 2016-03-14 (×2): 31.5556 via TOPICAL
  Administered 2016-03-15: 22:00:00 via TOPICAL
  Administered 2016-03-15 – 2016-03-17 (×6): 31.5556 via TOPICAL
  Administered 2016-03-17: 1 via TOPICAL
  Administered 2016-03-18 (×3): 31.5556 via TOPICAL
  Administered 2016-03-19: 21:00:00 via TOPICAL
  Administered 2016-03-19: 31.5556 via TOPICAL
  Administered 2016-03-19 – 2016-03-20 (×4): via TOPICAL
  Administered 2016-03-21 (×3): 1 via TOPICAL
  Administered 2016-03-22 – 2016-03-25 (×13): via TOPICAL
  Administered 2016-03-26: 1 via TOPICAL
  Administered 2016-03-26 (×2): via TOPICAL
  Administered 2016-03-27: 1 via TOPICAL
  Administered 2016-03-27 – 2016-04-03 (×19): via TOPICAL
  Administered 2016-04-04: 1 via TOPICAL
  Administered 2016-04-04 – 2016-04-09 (×16): via TOPICAL
  Filled 2016-02-28 (×2): qty 28.35

## 2016-02-28 MED ORDER — BACITRACIN ZINC 500 UNIT/GM EX OINT
TOPICAL_OINTMENT | CUTANEOUS | Status: AC
  Filled 2016-02-28: qty 28.35

## 2016-02-28 MED ORDER — HEMOSTATIC AGENTS (NO CHARGE) OPTIME
TOPICAL | Status: DC | PRN
  Administered 2016-02-28: 2 via TOPICAL

## 2016-02-28 SURGICAL SUPPLY — 69 items
APPLIER CLIP 9.375 SM OPEN (CLIP)
ATTRACTOMAT 16X20 MAGNETIC DRP (DRAPES) ×3 IMPLANT
BLADE SURG 15 STRL LF DISP TIS (BLADE) IMPLANT
BLADE SURG 15 STRL SS (BLADE)
BLADE SURG ROTATE 9660 (MISCELLANEOUS) IMPLANT
CLEANER TIP ELECTROSURG 2X2 (MISCELLANEOUS) ×3 IMPLANT
CLIP APPLIE 9.375 SM OPEN (CLIP) IMPLANT
CONT SPEC 4OZ CLIKSEAL STRL BL (MISCELLANEOUS) ×3 IMPLANT
CORDS BIPOLAR (ELECTRODE) ×3 IMPLANT
COVER SURGICAL LIGHT HANDLE (MISCELLANEOUS) ×3 IMPLANT
CRADLE DONUT ADULT HEAD (MISCELLANEOUS) ×3 IMPLANT
DERMABOND ADVANCED (GAUZE/BANDAGES/DRESSINGS)
DERMABOND ADVANCED .7 DNX12 (GAUZE/BANDAGES/DRESSINGS) IMPLANT
DRAIN CHANNEL 15F RND FF W/TCR (WOUND CARE) IMPLANT
DRAIN HEMOVAC 1/8 X 5 (WOUND CARE) ×6 IMPLANT
DRAIN JACKSON PRATT 10MM FLAT (MISCELLANEOUS) IMPLANT
DRAIN JACKSON RD 7FR 3/32 (WOUND CARE) IMPLANT
DRAPE PROXIMA HALF (DRAPES) IMPLANT
ELECT COATED BLADE 2.86 ST (ELECTRODE) ×6 IMPLANT
ELECT PAIRED SUBDERMAL (MISCELLANEOUS) ×3
ELECT REM PT RETURN 9FT ADLT (ELECTROSURGICAL) ×3
ELECTRODE PAIRED SUBDERMAL (MISCELLANEOUS) ×1 IMPLANT
ELECTRODE REM PT RTRN 9FT ADLT (ELECTROSURGICAL) ×1 IMPLANT
EVACUATOR SILICONE 100CC (DRAIN) ×6 IMPLANT
GAUZE SPONGE 4X4 16PLY XRAY LF (GAUZE/BANDAGES/DRESSINGS) ×12 IMPLANT
GLOVE BIO SURGEON STRL SZ8 (GLOVE) ×3 IMPLANT
GLOVE BIOGEL PI IND STRL 6.5 (GLOVE) ×2 IMPLANT
GLOVE BIOGEL PI IND STRL 7.5 (GLOVE) ×3 IMPLANT
GLOVE BIOGEL PI INDICATOR 6.5 (GLOVE) ×4
GLOVE BIOGEL PI INDICATOR 7.5 (GLOVE) ×6
GLOVE SS BIOGEL STRL SZ 7 (GLOVE) ×1 IMPLANT
GLOVE SUPERSENSE BIOGEL SZ 7 (GLOVE) ×2
GLOVE SURG SS PI 7.5 STRL IVOR (GLOVE) ×12 IMPLANT
GOWN STRL REUS W/ TWL LRG LVL3 (GOWN DISPOSABLE) ×5 IMPLANT
GOWN STRL REUS W/TWL LRG LVL3 (GOWN DISPOSABLE) ×10
HEMOSTAT NU-KNIT SURGICAL 3X4 (HEMOSTASIS) ×6 IMPLANT
HOVERMATT SINGLE USE (MISCELLANEOUS) ×3 IMPLANT
KIT BASIN OR (CUSTOM PROCEDURE TRAY) ×3 IMPLANT
KIT ROOM TURNOVER OR (KITS) ×3 IMPLANT
LOCATOR NERVE 3 VOLT (DISPOSABLE) IMPLANT
MANIFOLD NEPTUNE II (INSTRUMENTS) ×3 IMPLANT
NEEDLE HYPO 25GX1X1/2 BEV (NEEDLE) ×3 IMPLANT
NS IRRIG 1000ML POUR BTL (IV SOLUTION) ×3 IMPLANT
PAD ARMBOARD 7.5X6 YLW CONV (MISCELLANEOUS) ×6 IMPLANT
PENCIL BUTTON HOLSTER BLD 10FT (ELECTRODE) ×6 IMPLANT
PROBE NERVBE PRASS .33 (MISCELLANEOUS) ×3 IMPLANT
SHEARS HARMONIC 9CM CVD (BLADE) ×3 IMPLANT
SPECIMEN JAR SMALL (MISCELLANEOUS) ×6 IMPLANT
SPONGE INTESTINAL PEANUT (DISPOSABLE) ×3 IMPLANT
STAPLER VISISTAT 35W (STAPLE) ×3 IMPLANT
SURGIFLO W/THROMBIN 8M KIT (HEMOSTASIS) ×3 IMPLANT
SUT CHROMIC 3 0 PS 2 (SUTURE) ×6 IMPLANT
SUT CHROMIC 4 0 PS 2 18 (SUTURE) ×6 IMPLANT
SUT ETHILON 3 0 PS 1 (SUTURE) ×3 IMPLANT
SUT MNCRL AB 4-0 PS2 18 (SUTURE) IMPLANT
SUT PLAIN 5 0 P 3 18 (SUTURE) ×3 IMPLANT
SUT SILK 2 0 (SUTURE) ×4
SUT SILK 2 0 SH CR/8 (SUTURE) ×9 IMPLANT
SUT SILK 2-0 18XBRD TIE 12 (SUTURE) ×2 IMPLANT
SUT VIC AB 3-0 SH 8-18 (SUTURE) ×3 IMPLANT
SUT VICRYL 4 0 EN S 48 D7796 (SUTURE) IMPLANT
SYRINGE 10CC LL (SYRINGE) ×3 IMPLANT
TOWEL OR 17X24 6PK STRL BLUE (TOWEL DISPOSABLE) ×3 IMPLANT
TRAY ENT MC OR (CUSTOM PROCEDURE TRAY) ×3 IMPLANT
TRAY FOLEY CATH 16FR SILVER (SET/KITS/TRAYS/PACK) IMPLANT
TUBE ENDOTRAC EMG 7X10.2 (MISCELLANEOUS) ×3 IMPLANT
TUBE ENDOTRAC EMG 8X11.3 (MISCELLANEOUS) IMPLANT
TUBE ENDOTRACH  EMG 6MMTUBE EN (MISCELLANEOUS)
TUBE ENDOTRACH EMG 6MMTUBE EN (MISCELLANEOUS) IMPLANT

## 2016-02-28 NOTE — Care Management Note (Signed)
Case Management Note  Patient Details  Name: Kendra Osborne MRN: [id number] Date of Birth: 05/30/1946  Subjective/Objective: Pt admitted on 02/24/16 with respiratory failure likely due to large goiter.  PTA, pt resided at home with family.  She speaks no [place].                    Action/Plan: Plan for complete thyroidectomy today.  Will follow post op for discharge planning as pt progresses.    Expected Discharge Date:                  Expected Discharge Plan:  IP Rehab Facility  In-House Referral:     Discharge planning Services  CM Consult  Post Acute Care Choice:    Choice offered to:     DME Arranged:    DME Agency:     HH Arranged:    HH Agency:     Status of Service:  In process, will continue to follow  Medicare Important Message Given:    Date Medicare IM Given:    Medicare IM give by:    Date Additional Medicare IM Given:    Additional Medicare Important Message give by:     If discussed at Long Length of Stay Meetings, dates discussed:    Additional Comments:  Quintella BatonJulie W. Landa Mullinax, RN, BSN  Trauma/Neuro ICU Case Manager [phone]

## 2016-02-28 NOTE — Anesthesia Procedure Notes (Addendum)
Procedure Name: Intubation Date/Time: 02/28/2016 4:57 PM Performed by: Fabian NovemberSOLHEIM, JOSHUA SALOMAN Pre-anesthesia Checklist: Patient identified, Emergency Drugs available, Suction available, Patient being monitored and Timeout performed Patient Re-evaluated:Patient Re-evaluated prior to inductionOxygen Delivery Method: Circle system utilized Preoxygenation: Pre-oxygenation with 100% oxygen Intubation Type: Inhalational induction with existing ETT Laryngoscope Size: Glidescope and 4 Grade View: Grade I Tube type: NIMs tube. Tube size: 7.0 mm Number of attempts: 1 Airway Equipment and Method: Video-laryngoscopy (Cook catheter) Placement Confirmation: ETT inserted through vocal cords under direct vision,  breath sounds checked- equal and bilateral and CO2 detector Secured at: 20 cm Tube secured with: Tape Dental Injury: Teeth and Oropharynx as per pre-operative assessment  Comments: Existing ETT removed after DL with with Mac 4 Glidescope and insertion of cook catheter. ETT gently withdrawn while maintaining view with glidescope. 7.0 NIMs tube gently and atraumatically inserted over cook catheter with positive confirmation with BBS and +ETCO2. Pt has very poor dentition with multiple loose/broken, and missing teeth. No change in dentition noted through this procedure.    Central Venous Catheter Insertion Performed by: anesthesiologist 02/28/2016 4:45 PM Patient location: OR. Preanesthetic checklist: patient identified, IV checked, site marked, risks and benefits discussed, surgical consent, monitors and equipment checked, pre-op evaluation, timeout performed and anesthesia consent Position: Trendelenburg Lidocaine 1% used for infiltration Landmarks identified and Seldinger technique used Catheter size: 8 Fr Central line was placed.Double lumen Procedure performed without using ultrasound guided technique. Attempts: 1 Following insertion, dressing applied, line sutured and Biopatch. Post  procedure assessment: blood return through all ports. Patient tolerated the procedure well with no immediate complications.

## 2016-02-28 NOTE — Op Note (Signed)
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 Massive thyroid goiter extending substernally and lateral to the pharynx bilaterally.  Operative Details: The patient was brought to the operating room and prepped and draped in the usual sterile fashion. Anesthesia exchanged her regular ETT for a nerve monitoring tube which was used throughout the case and then switched back to a regular ETT at the end of the case. A low collar incision was made in the standard fashion and was carried down through the platysma muscles. The strap muscles were divided in the midline until the thyroid was identified. Next dissection first proceeded with dissection of the right lobe of the thyroid. This was done from superior pole to inferior pole fashion using blunt dissection and harmonic scalpel to protect the inferior thyroid arteries, and all branching structures in this region with care taken to hug the capsule at all times. I dissected inferiorly and identified the posterior branch of the right recurrent laryngeal nerve as it inserted into the larynx. The anterior branch was nicked dissecting the massive goiter off of the right cricothyroid muscle, so I repaired the right anterior RLN branch end-to-end with 2-0 silk sutures. The right thyroid lobe was then reflected inferiorly until  it was attached to the trachea and ligament of Allyson SabalBerry was identified at this point. I identified, preserved, and dissected laterally the right inferior parathyroid gland and left it attached to its blood supply. I removed the right thyroid lobe and passed it off for pathology and attention was turned to the left lobe of the thyroid. Dissection continued the same way as the right lobe proceeding from superior to inferior pole fashion using blunt dissection and harmonic scalpel. Once taken, the vascular structure of the left thyroid lobe was reflected up into the field. Dissection continued medially until reaching the trachea and taking ligament of Berry. I identified and preserved the left recurrent laryngeal nerve, and also identified and preserved the left inferior parathyroid gland and preserved its blood supply. Once the left lobe of the thyroid was freed, it was passed from the field as specimen number 2. Surgicel and surgiflo was placed in the wound bilaterally. After obtaining hemostasis with the bipolar I placed bilateral 10mm flat JP drains in the wound and the surgicel was left in place and the strap muscles were reapproximated loosely with interrupted Vicryl sutures. The platysma was reapproximated as well with several interrupted Vicryls and the skin was reapproximated with simple interrupted 3-0 chromics. Bacitracin ointment was applied to the wound. The patient was left intubated, and taken to ICU  in stable but critical condition. Lap and needle counts were correct X 2 as reported to me.   Dr. Casimer LaniusMitchell R. Alga Southall MDPhD was present and performed the entire procedure.   Melvenia BeamGore, Vernetta Dizdarevic, MD  10:22 PM  02/28/2016

## 2016-02-28 NOTE — Anesthesia Postprocedure Evaluation (Signed)
Anesthesia Post Note  Patient: Kendra PootNaseem Bohle  Procedure(s) Performed: Procedure(s) (LRB):  TOTAL THYROIDECTOMY WITH NIMS MONITOR (N/A)  Patient location during evaluation: ICU Anesthesia Type: General Level of consciousness: sedated Pain management: pain level controlled Vital Signs Assessment: post-procedure vital signs reviewed and stable Respiratory status: patient remains intubated per anesthesia plan Cardiovascular status: stable Postop Assessment: no signs of nausea or vomiting Anesthetic complications: no    Last Vitals:  Filed Vitals:   02/28/16 2229 02/28/16 2238  BP:  142/59  Pulse:  71  Temp: 36.9 C   Resp:  43    Last Pain:  Filed Vitals:   02/28/16 2239  PainSc: 5                  Sharonna Vinje

## 2016-02-28 NOTE — Anesthesia Preprocedure Evaluation (Addendum)
Anesthesia Evaluation  Patient identified by MRN, date of birth, ID band Patient unresponsive    Reviewed: Allergy & Precautions, H&P , NPO status , Patient's Chart, lab work & pertinent test results  Airway Mallampati: Intubated       Dental no notable dental hx. (+) Poor Dentition, Loose, Missing, Chipped,    Pulmonary  VDRF   Pulmonary exam normal breath sounds clear to auscultation       Cardiovascular hypertension, Pt. on medications  Rhythm:Regular Rate:Normal     Neuro/Psych negative neurological ROS  negative psych ROS   GI/Hepatic negative GI ROS, Neg liver ROS,   Endo/Other  Morbid obesityGoiter  Renal/GU negative Renal ROS  negative genitourinary   Musculoskeletal   Abdominal (+) + obese,   Peds  Hematology negative hematology ROS (+)   Anesthesia Other Findings   Reproductive/Obstetrics negative OB ROS                           Anesthesia Physical Anesthesia Plan  ASA: IV  Anesthesia Plan: General   Post-op Pain Management:    Induction: Intravenous  Airway Management Planned: Oral ETT  Additional Equipment:   Intra-op Plan:   Post-operative Plan: Post-operative intubation/ventilation  Informed Consent: I have reviewed the patients History and Physical, chart, labs and discussed the procedure including the risks, benefits and alternatives for the proposed anesthesia with the patient or authorized representative who has indicated his/her understanding and acceptance.   Dental advisory given  Plan Discussed with: CRNA  Anesthesia Plan Comments:         Anesthesia Quick Evaluation

## 2016-02-28 NOTE — H&P (Signed)
02/28/2016  Kendra Osborne, Kendra Osborne  PREOPERATIVE HISTORY AND PHYSICAL  CHIEF COMPLAINT: thyroid goiter, acute respiratory failure  HISTORY: This is a 70 year old who presents with thyroid mass, acute respiratory failure.  She now presents for total thyroidectomy.  Dr. Emeline DarlingGore, Clovis RileyMitchell has discussed the risks (recurrent laryngeal nerve injury, bleeding, infection, hypocalcemia, need for tracheotomy, anesthesia risks/perioperative risks, etc.) , benefits, and alternatives of this procedure with the family. The patient's family understands the risks and would like to proceed with the procedure. The chances of success of the procedure are >50% and the patient understands this. I personally performed an examination of the patient within 24 hours of the procedure.  PAST MEDICAL HISTORY: Past Medical History  Diagnosis Date  . Hypertension     PAST SURGICAL HISTORY: History reviewed. No pertinent past surgical history.  MEDICATIONS: No current facility-administered medications on file prior to encounter.   No current outpatient prescriptions on file prior to encounter.     ALLERGIES: Not on File   SOCIAL HISTORY: Social History   Social History  . Marital Status: Widowed    Spouse Name: N/A  . Number of Children: N/A  . Years of Education: N/A   Occupational History  . Not on file.   Social History Main Topics  . Smoking status: Never Smoker   . Smokeless tobacco: Not on file  . Alcohol Use: No  . Drug Use: No  . Sexual Activity: Not on file   Other Topics Concern  . Not on file   Social History Narrative  . No narrative on file   FAMILY HISTORY: No family history on file.  REVIEW OF SYSTEMS:  Unable to obtain x 12 systems as patient is intubated/sedated   PHYSICAL EXAM:  GENERAL:  intubated VITAL SIGNS:   Filed Vitals:   02/28/16 0600 02/28/16 0800  BP: 129/51 156/77  Pulse: 66 77  Temp:    Resp: 24 39   SKIN:  Warm, dry HEENT:  Endotracheal tube in place NECK:   Significant redundant adipose tissue with palpable L>R thyroid goiter LYMPH:  No LAD palpated ABDOMEN:  obese MUSCULOSKELETAL: grossly normal PSYCH:  Intubated and sedated NEUROLOGIC:  Intubated and sedated  DIAGNOSTIC STUDIES: CT neck and thyroid ultrasound show L>R thyroid goiter without focal nodules  ASSESSMENT AND PLAN: Plan to proceed with total thyroidectomy. Patient's family understands the risks, benefits, and alternatives. Informed written consent obtained from patient's son by phone and signed witnessed and on chart. 02/28/2016  8:03 AM Kendra Osborne, Ary Lavine

## 2016-02-28 NOTE — Progress Notes (Signed)
PULMONARY / CRITICAL CARE MEDICINE    Name: Kendra Osborne MRN: [id number] DOB: 1946/12/07    ADMISSION DATE:  02/24/2016  PRIMARY SERVICE: PCCM  CHIEF COMPLAINT:  Dyspnea  BRIEF PATIENT DESCRIPTION: 70 y/o woman recently moved from [place] who presents with respiratory distress. She presented to the ED on 3/17 with complaints of dyspnea, and lower extremity edema. History is limited due to language / culture barriers, but she has a PMHx of a known goiter that has been present for years, as well as possibly hypertension. It is not clear if the goiter was ever worked up or if she ever was on regular therapy for her hypertension. Her Nephew and son (who speaks little [place]) report that she been having a cough for a few days as well as some fevers and chills.  SIGNIFICANT EVENTS / STUDIES:  CT-A of Chest >> mild mediastinal enlargement CT of the Neck with contrast >> multinodular goiter, meningioma  LINES / TUBES: 7.13mm OETT >> 3/17 PIV x2 >> 3/18 OGT >> 3/18 Foley  >> 3/18  CULTURES: Blood 3/18 >> Sputum   ANTIBIOTICS: Azithro >> CTX >>    SUBJECTIVE:  Afebrile Poor UO Agitated on WUA with gagging & coughing  VITAL SIGNS: Temp:  [98.1 F (36.7 C)-98.4 F (36.9 C)] 98.4 F (36.9 C) (03/21 0400) Pulse Rate:  [63-86] 77 (03/21 0800) Resp:  [21-27] 24 (03/21 0800) BP: (122-168)/(48-93) 156/77 mmHg (03/21 0800) SpO2:  [96 %-100 %] 99 % (03/21 0800) FiO2 (%):  [40 %] 40 % (03/21 0800) Weight:  [233 lb 11 oz (106 kg)] 233 lb 11 oz (106 kg) (03/21 0232) HEMODYNAMICS:   VENTILATOR SETTINGS: Vent Mode:  [-] PRVC FiO2 (%):  [40 %] 40 % Set Rate:  [24 bmp] 24 bmp Vt Set:  [380 mL] 380 mL PEEP:  [5 cmH20] 5 cmH20 Plateau Pressure:  [19 cmH20-21 cmH20] 20 cmH20 INTAKE / OUTPUT: Intake/Output      03/20 0701 - 03/21 0700 03/21 0701 - 03/22 0700   I.V. (mL/kg) 753.5 (7.1) 72.9 (0.7)   NG/GT 75.3    IV Piggyback     Total Intake(mL/kg) 828.8 (7.8) 72.9 (0.7)   Urine  (mL/kg/hr) 570 (0.2) 40 (0.2)   Total Output 570 40   Net +258.8 +32.9          PHYSICAL EXAMINATION: General:  Obese woman, intubated Neuro:  Sedated, RASS-1 to +2 HEENT: Moist mucus membranes, OETT in place. Neck: Firm, palpable goiter. Cardiovascular: Heart sounds dual and normal. Lungs:  Mechanical Breath Sounds Abdomen:  Obese, soft Musculoskeletal:  No swollen joints Skin:  No rashes on exposed skin  LABS:  CBC  Recent Labs Lab 02/26/16 0410 02/27/16 0254 02/28/16 0220  WBC 9.7 10.2 8.0  HGB 10.8* 12.1 10.9*  HCT 35.2* 39.5 35.5*  PLT 234 241 209   Coag's No results for input(s): APTT, INR in the last 168 hours. BMET  Recent Labs Lab 02/26/16 0410 02/27/16 0254 02/28/16 0220  NA 139 137 139  K 2.7* 3.3* 3.6  CL 98* 101 103  CO2 BUN 23* 18 25*  CREATININE 0.97 0.93 0.86  GLUCOSE 88 85 100*   Electrolytes  Recent Labs Lab 02/26/16 0410 02/27/16 0254 02/28/16 0220  CALCIUM 7.5* 7.6* 7.9*  MG 1.3* 2.0 2.0  PHOS 2.8 3.8 4.1   Sepsis Markers No results for input(s): LATICACIDVEN, PROCALCITON, O2SATVEN in the last 168 hours. ABG  Recent Labs Lab 02/24/16 2249 02/25/16 0048 02/25/16 7829  PHART 7.176* 7.298* 7.435  PCO2ART 95.7* 75.7* 52.4*  PO2ART 93.0 127.0* 71.0*   Liver Enzymes No results for input(s): AST, ALT, ALKPHOS, BILITOT, ALBUMIN in the last 168 hours. Cardiac Enzymes  Recent Labs Lab 02/24/16 2000  TROPONINI 0.14*   Glucose  Recent Labs Lab 02/26/16 1516 02/27/16 0853 02/27/16 1548 02/27/16 2026 02/27/16 2350 02/28/16 0342  GLUCAP 71 77 77 95 98 72    Imaging Dg Chest Port 1 View  02/28/2016  CLINICAL DATA:  Acute respiratory failure, nonsmoker, history of hypertension. EXAM: PORTABLE CHEST 1 VIEW COMPARISON:  Portable chest x-ray of February 27, 2016 FINDINGS: The right hemidiaphragm remains elevated. The pulmonary interstitial markings remain increased bilaterally the cardiac silhouette remains  enlarged. The pulmonary vascularity remains engorged. The endotracheal tube tip lies approximately a 1.2 cm above the carina. The esophagogastric tube tip projects below the inferior margin of the image. IMPRESSION: 1. Stable appearance of the chest with increased pulmonary interstitial markings consistent with mild interstitial edema. Stable cardiomegaly and stable elevation of the right hemidiaphragm. 2. The endotracheal tube tip lies approximately 1.2 cm above the carina. Withdrawal by 2-3 cm is recommended to avoid accidental mainstem bronchus intubation. Electronically Signed   By: David  [place] M.D.   On: 02/28/2016 07:47   Dg Chest Port 1 View  02/27/2016  CLINICAL DATA:  Acute respiratory failure EXAM: PORTABLE CHEST 1 VIEW COMPARISON:  Portable chest x-ray of February 26, 2016 FINDINGS: The lungs are mildly hypoinflated. The interstitial markings remain coarse bilaterally. The left hemidiaphragm remains partially obscured. The cardiac silhouette remains enlarged. The central pulmonary vascularity is engorged. The endotracheal tube tip lies approximately 2.3 cm above the carina. The esophagogastric tube tip projects below the inferior margin of the image. IMPRESSION: Stable hypoinflation with left lower lobe atelectasis or pneumonia. Stable mild cardiomegaly and central pulmonary vascular congestion. The support tubes are in reasonable position. Electronically Signed   By: David  [place] M.D.   On: 02/27/2016 07:23    EKG: NSR Chest CT reviewed and showed significant proximal airway stenosis associated with multinodular goiter.  ASSESSMENT / PLAN:  Active Problems:   Respiratory failure (HCC)   PULMONARY A: Acute on chronic Hypercarbic Respiratory Failure Acute Hypoxic Respiratory Failure Threatened airway to to extrinsic compression from large goiter Unclear etiology of hypoxia; suspect multifactorial, largest contribution from goiter. Consider viral bronchitis / pneumonitis, some component  of pulm edema.  P:   Empiric abx for CAP, although no definite infiltrate on CXR SBTs. ENT plan for thyroidectomy  CARDIOVASCULAR A: HTN Echo 3/19 nml LV fn, gr 1 DD P:    PRN hydralazine and/or labetalol IV for BP control.  RENAL A: Renal insufficiency, improved hypokalemia P:   Follow BMP, replete lytes as needed  GASTROINTESTINAL A: Obesity SUP P:   PPI  HEMATOLOGIC A: No active issues P:   DVT PPX  INFECTIOUS A: Consider CAP, doubt P:   CAP coverage for now, d/c 3/20.  No leukocytosis or secretions noted   ENDOCRINE A: Multinodular Goiter P:   Normal TSH  NEUROLOGIC A: Pain/ sedation P:    propofol gt fent prn RASS goal -1  Family: updated by ENT 3/20  The patient is critically ill with multiple organ systems failure and requires high complexity decision making for assessment and support, frequent evaluation and titration of therapies, application of advanced monitoring technologies and extensive interpretation of multiple databases. Critical Care Time devoted to patient care services described in this note independent of APP time  is 35 minutes.    Cyril Mourningakesh Isys Tietje MD. Tonny BollmanFCCP. [facility] Pager [phone] If no response call [phone]   02/28/2016    02/28/2016, 8:59 AM

## 2016-02-28 NOTE — Progress Notes (Signed)
Hypoglycemic Event  CBG: 62  Treatment: D50 IV 25 mL  Symptoms: None  Follow-up CBG: Time: 1610 CBG Result: 110  Possible Reasons for Event: Inadequate meal intake - Pt NPO for surgery  Comments/MD notified: Anesthesia aware when patient taken to surgery. [facility].     Holly Bodilyulbertson, Bethany Leigh

## 2016-02-28 NOTE — Progress Notes (Signed)
No cuff leak noted.

## 2016-02-28 NOTE — Transfer of Care (Signed)
Immediate Anesthesia Transfer of Care Note  Patient: Kendra PootNaseem Scheibe  Procedure(s) Performed: Procedure(s):  TOTAL THYROIDECTOMY WITH NIMS MONITOR (N/A)  Patient Location: NICU  Anesthesia Type:General  Level of Consciousness: Patient remains intubated per anesthesia plan  Airway & Oxygen Therapy: Patient placed on Ventilator (see vital sign flow sheet for setting)  Post-op Assessment: Report given to RN and Post -op Vital signs reviewed and stable  Post vital signs: Reviewed and stable  Last Vitals:  Filed Vitals:   02/28/16 1620 02/28/16 2229  BP: 144/80   Pulse: 73   Temp:  36.9 C  Resp: 24     Complications: No apparent anesthesia complications

## 2016-02-28 NOTE — Progress Notes (Signed)
Jewelry (nose ring and bracelet) removed and held at bedside for family. Son previously made aware and ok with need for cutting off bracelet.

## 2016-02-29 ENCOUNTER — Inpatient Hospital Stay (HOSPITAL_COMMUNITY): Payer: Medicaid Other

## 2016-02-29 ENCOUNTER — Encounter (HOSPITAL_COMMUNITY): Payer: Self-pay | Admitting: Otolaryngology

## 2016-02-29 LAB — BASIC METABOLIC PANEL
Anion gap: 7 (ref 5–15)
BUN: 15 mg/dL (ref 6–20)
CHLORIDE: 103 mmol/L (ref 101–111)
CO2: 23 mmol/L (ref 22–32)
Calcium: 7.5 mg/dL — ABNORMAL LOW (ref 8.9–10.3)
Creatinine, Ser: 0.8 mg/dL (ref 0.44–1.00)
GFR calc non Af Amer: >60 mL/min (ref >60)
Glucose, Bld: 152 mg/dL — ABNORMAL HIGH (ref 65–99)
POTASSIUM: 4.2 mmol/L (ref 3.5–5.1)
SODIUM: 133 mmol/L — AB (ref 135–145)

## 2016-02-29 LAB — CBC
HEMATOCRIT: 37.9 % (ref 36.0–46.0)
Hemoglobin: 11.5 g/dL — ABNORMAL LOW (ref 12.0–15.0)
MCH: 25.1 pg — AB (ref 26.0–34.0)
MCHC: 30.3 g/dL (ref 30.0–36.0)
MCV: 82.8 fL (ref 78.0–100.0)
Platelets: 283 10*3/uL (ref 150–400)
RBC: 4.58 MIL/uL (ref 3.87–5.11)
RDW: 17.8 % — ABNORMAL HIGH (ref 11.5–15.5)
WBC: 10 10*3/uL (ref 4.0–10.5)

## 2016-02-29 LAB — GLUCOSE, CAPILLARY
GLUCOSE-CAPILLARY: 116 mg/dL — AB (ref 65–99)
GLUCOSE-CAPILLARY: 162 mg/dL — AB (ref 65–99)
Glucose-Capillary: 128 mg/dL — ABNORMAL HIGH (ref 65–99)
Glucose-Capillary: 160 mg/dL — ABNORMAL HIGH (ref 65–99)

## 2016-02-29 LAB — CALCIUM: Calcium: 7.4 mg/dL — ABNORMAL LOW (ref 8.9–10.3)

## 2016-02-29 LAB — MAGNESIUM
Magnesium: 1.7 mg/dL (ref 1.7–2.4)
Magnesium: 2 mg/dL (ref 1.7–2.4)

## 2016-02-29 LAB — PHOSPHORUS: Phosphorus: 4.2 mg/dL (ref 2.5–4.6)

## 2016-02-29 MED ORDER — CEFAZOLIN SODIUM-DEXTROSE 2-4 GM/100ML-% IV SOLN
2.0000 g | Freq: Three times a day (TID) | INTRAVENOUS | Status: DC
  Administered 2016-02-29 – 2016-03-02 (×6): 2 g via INTRAVENOUS
  Filled 2016-02-29 (×10): qty 100

## 2016-02-29 MED ORDER — MAGNESIUM SULFATE IN D5W 10-5 MG/ML-% IV SOLN
1.0000 g | Freq: Once | INTRAVENOUS | Status: AC
  Administered 2016-02-29: 1 g via INTRAVENOUS
  Filled 2016-02-29: qty 100

## 2016-02-29 MED ORDER — FUROSEMIDE 10 MG/ML IJ SOLN
20.0000 mg | Freq: Once | INTRAMUSCULAR | Status: AC
  Administered 2016-02-29: 20 mg via INTRAVENOUS
  Filled 2016-02-29: qty 2

## 2016-02-29 MED ORDER — HYDRALAZINE HCL 20 MG/ML IJ SOLN
10.0000 mg | INTRAMUSCULAR | Status: DC | PRN
  Administered 2016-02-29 – 2016-04-06 (×19): 10 mg via INTRAVENOUS
  Filled 2016-02-29 (×19): qty 1

## 2016-02-29 NOTE — Progress Notes (Signed)
Subjective: POD#1 from total thyroidectomy. Drains put out about 200mL total from both sine OR, patient stable and awakens to voice, corrected calcium is 9 today, stable from post-op yesterday.  Objective: Vital signs in last 24 hours: Temp:  [97.5 F (36.4 C)-98.6 F (37 C)] 98.4 F (36.9 C) (03/22 1200) Pulse Rate:  [66-95] 88 (03/22 1100) Resp:  [19-50] 28 (03/22 1100) BP: (101-192)/(38-80) 148/48 mmHg (03/22 1100) SpO2:  [95 %-100 %] 98 % (03/22 1100) Arterial Line BP: (69-202)/(46-128) 184/72 mmHg (03/22 1030) FiO2 (%):  [40 %] 40 % (03/22 0917) Weight:  [107.7 kg (237 lb 7 oz)] 107.7 kg (237 lb 7 oz) (03/22 0415)  Intubated and sedated, awakens to voice, right drain with about 25mL serosanguinous drainage, left with about 10mL serosanguinous drainage, neck supple and flat, incision clean dry and intact. Drains holding bulb suction.  @LABLAST2 (wbc:2,hgb:2,hct:2,plt:2)  Recent Labs  02/28/16 0220 02/28/16 2300 02/29/16 0434  NA 139  --  133*  K 3.6  --  4.2  CL 103  --  103  CO2 26  --  23  GLUCOSE 100*  --  152*  BUN 25*  --  15  CREATININE 0.86  --  0.80  CALCIUM 7.9* 7.5* 7.5*    Medications:  Scheduled Meds: . antiseptic oral rinse  7 mL Mouth Rinse 10 times per day  . bacitracin   Topical TID  .  ceFAZolin (ANCEF) IV  2 g Intravenous 3 times per day  . chlorhexidine gluconate  15 mL Mouth Rinse BID  . dexamethasone  10 mg Intravenous Q8H  . feeding supplement (PRO-STAT SUGAR FREE 64)  60 mL Per Tube TID  . feeding supplement (VITAL HIGH PROTEIN)  1,000 mL Per Tube Q24H  . levothyroxine  75 mcg Intravenous QAC breakfast  .  morphine injection  4 mg Intravenous Once  . pantoprazole (PROTONIX) IV  40 mg Intravenous Q24H   Continuous Infusions: . propofol (DIPRIVAN) infusion Stopped (02/29/16 0918)   PRN Meds:.sodium chloride, fentaNYL (SUBLIMAZE) injection, fentaNYL (SUBLIMAZE) injection, hydrALAZINE, iohexol, iohexol, iohexol, midazolam,  midazolam  Assessment/Plan: Stable s/p total thyroidectomy, corrected calcium normal at 9, will continue to monitor. Will monitor drains until output is decreased significantly. Hopefully with her massive goiter removed she can be weaned off ventilator and extubated, will monitor with ICU team.   LOS: 4 days   Melvenia BeamGore, Arlow Spiers 02/29/2016, 12:11 PM

## 2016-02-29 NOTE — Progress Notes (Addendum)
PULMONARY / CRITICAL CARE MEDICINE    Name: Kendra Osborne MRN: [id number] DOB: 04/18/1946    ADMISSION DATE:  02/24/2016  PRIMARY SERVICE: PCCM  CHIEF COMPLAINT:  Dyspnea  BRIEF PATIENT DESCRIPTION: 70 y/o woman recently moved from [name] who presents with respiratory distress. She presented to the ED on 3/17 with complaints of dyspnea, and lower extremity edema. History is limited due to language / culture barriers, but she has a PMHx of a known goiter that has been present for years, as well as possibly hypertension. It is not clear if the goiter was ever worked up or if she ever was on regular therapy for her hypertension. Her Nephew and son (who speaks little [place]) report that she been having a cough for a few days as well as some fevers and chills.  SUBJECTIVE: Patient underwent thyroidectomy 3/21 by Dr. Emeline DarlingGore.  REVIEW OF SYSTEMS:  Unable to obtain on ventilator.  VITAL SIGNS: Temp:  [97.4 F (36.3 C)-98.6 F (37 C)] 98.6 F (37 C) (03/22 0400) Pulse Rate:  [66-83] 72 (03/22 0722) Resp:  [13-43] 24 (03/22 0722) BP: (101-168)/(38-80) 144/57 mmHg (03/22 0722) SpO2:  [95 %-100 %] 96 % (03/22 0722) Arterial Line BP: (120-202)/(50-128) 181/86 mmHg (03/22 0600) FiO2 (%):  [40 %] 40 % (03/22 0722) Weight:  [107.7 kg (237 lb 7 oz)] 107.7 kg (237 lb 7 oz) (03/22 0415) HEMODYNAMICS:   VENTILATOR SETTINGS: Vent Mode:  [-] PSV;CPAP FiO2 (%):  [40 %] 40 % Set Rate:  [24 bmp] 24 bmp Vt Set:  [380 mL] 380 mL PEEP:  [5 cmH20] 5 cmH20 Pressure Support:  [18 cmH20] 18 cmH20 Plateau Pressure:  [17 cmH20-23 cmH20] 23 cmH20 INTAKE / OUTPUT: Intake/Output      03/21 0701 - 03/22 0700 03/22 0701 - 03/23 0700   I.V. (mL/kg) 4216.4 (39.1)    NG/GT     Total Intake(mL/kg) 4216.4 (39.1)    Urine (mL/kg/hr) 2055 (0.8)    Drains 200 (0.1)    Blood 220 (0.1)    Total Output 2475     Net +1741.4            PHYSICAL EXAMINATION: General:  Eyes closed. No acute distress. No family at  bedside.  Integument:  Warm & dry. No rash on exposed skin. Surgical incision on neck clean, dry, and intact. HEENT:  No scleral injection or icterus. Endotracheal tube in place.  Cardiovascular:  Regular rate. No edema. No appreciable JVD.  Pulmonary:  Good aeration & clear to auscultation bilaterally. Symmetric chest wall rise on ventilator. Abdomen: Soft. Normal bowel sounds. Nondistended.  Neurological: Pupils reactive. Doesn't follow commands. Withdraws to pain in all 4 extremities.  LABS:  CBC  Recent Labs Lab 02/27/16 0254 02/28/16 0220 02/29/16 0434  WBC 10.2 8.0 10.0  HGB 12.1 10.9* 11.5*  HCT 39.5 35.5* 37.9  PLT 241 209 283   Coag's No results for input(s): APTT, INR in the last 168 hours. BMET  Recent Labs Lab 02/27/16 0254 02/28/16 0220 02/29/16 0434  NA 137 139 133*  K 3.3* 3.6 4.2  CL 101 103 103  CO2 25 26 23   BUN 18 25* 15  CREATININE 0.93 0.86 0.80  GLUCOSE 85 100* 152*   Electrolytes  Recent Labs Lab 02/27/16 0254 02/28/16 0220 02/28/16 2300 02/29/16 0434  CALCIUM 7.6* 7.9* 7.5* PENDING  MG 2.0 2.0 1.6* 1.7  PHOS 3.8 4.1  --  4.2   Sepsis Markers No results for input(s): LATICACIDVEN, PROCALCITON, O2SATVEN in the  last 168 hours. ABG  Recent Labs Lab 02/24/16 2249 02/25/16 0048 02/25/16 0336  PHART 7.176* 7.298* 7.435  PCO2ART 95.7* 75.7* 52.4*  PO2ART 93.0 127.0* 71.0*   Liver Enzymes  Recent Labs Lab 02/28/16 2300  ALBUMIN 2.1*   Cardiac Enzymes  Recent Labs Lab 02/24/16 2000  TROPONINI 0.14*   Glucose  Recent Labs Lab 02/28/16 0849 02/28/16 1143 02/28/16 1541 02/28/16 1613 02/28/16 2227 02/28/16 2318  GLUCAP 73 78 62* 110* 131* 115*    Imaging Dg Chest Port 1 View  02/29/2016  CLINICAL DATA:  Respiratory failure. EXAM: PORTABLE CHEST 1 VIEW COMPARISON:  02/28/2016. FINDINGS: Endotracheal tube, NG tube, left subclavian line in stable position. Cardiomegaly with mild pulmonary vascular prominence and  bilateral interstitial prominence with left-sided pleural effusion. Findings suggest congestive heart failure. Pneumonitis cannot be excluded . IMPRESSION: 1. Lines and tubes in stable position. 2. Cardiomegaly with mild bilateral pulmonary interstitial prominence and left pleural effusion consistent with congestive heart failure. Pneumonitis cannot be excluded . Electronically Signed   By: Maisie Fus  Register   On: 02/29/2016 07:28   Dg Chest Port 1 View  02/28/2016  CLINICAL DATA:  Acute respiratory failure, nonsmoker, history of hypertension. EXAM: PORTABLE CHEST 1 VIEW COMPARISON:  Portable chest x-ray of February 27, 2016 FINDINGS: The right hemidiaphragm remains elevated. The pulmonary interstitial markings remain increased bilaterally the cardiac silhouette remains enlarged. The pulmonary vascularity remains engorged. The endotracheal tube tip lies approximately a 1.2 cm above the carina. The esophagogastric tube tip projects below the inferior margin of the image. IMPRESSION: 1. Stable appearance of the chest with increased pulmonary interstitial markings consistent with mild interstitial edema. Stable cardiomegaly and stable elevation of the right hemidiaphragm. 2. The endotracheal tube tip lies approximately 1.2 cm above the carina. Withdrawal by 2-3 cm is recommended to avoid accidental mainstem bronchus intubation. Electronically Signed   By: David  [place] M.D.   On: 02/28/2016 07:47   SIGNIFICANT EVENTS: 3/17 - Admit 3/21 - Thyroidectomy  STUDIES:  CT-A of Chest >> mild mediastinal enlargement CT of the Neck with contrast >> multinodular goiter, meningioma Port CXR 3/22:  ETT & CVL in good position. Blunting bilateral costophrenic angles suggestive of effusions. Bilateral hilar fullness & interstitial prominence with low lung volumes.   LINES / TUBES: OETT 7.5 3/17 - 3/21; 7.0 3/21>>> L Subclavian CVL 3/21>>> L Port 3/21>>> L Radial Art Line 3/21>>> OGT 3/21>>> R & L Neck Drains  3/21>>> Foley 3/18>>> PIV x1  MICROBIOLOGY: Blood Ctx x2 3/18>>> MRSA PCR 3/18:  Negative   ANTIBIOTICS: Ancef 3/21>>>(end date ordered for 3/28) Clindamycin 3/21 (only 1 dose) Azithromycin 3/18 - 3/19 Rocephin 3/18 - 3/20  ASSESSMENT / PLAN:  PULMONARY A: Acute on Chronic Hypercarbic Respiratory Failure Acute Hypoxic Respiratory Failure - Question element of pulmonary edema vs viral bronchitis/pneumonitis. Extrinsic Airway Compression - Secondary to goiter.  P:   S/P Thyroidectomy 3/21 Full Vent Support  Spontaneous breathing trial in the morning Lasix  IV x1  CARDIOVASCULAR A: HTN - TTE 3/19 nml LV fn, gr 1 DD.  P:   Monitor on telemetry Vitals per unit protocol Hydralazine IV prn  RENAL A: Hyponatremia - Mild. Acute Renal Failure - Resolved. Hypokalemia - Resolved. Hypomagnesemia - Resolved.  P:   Trending UOP with Foley Monitor electrolytes & renal function daily  GASTROINTESTINAL A: No acute issues.  P:   NPO  Protonix IV daily Tube Feeds  HEMATOLOGIC A: Anemia - Mild.  P:   Trending cell counts  daily w/ CBC SCDs  INFECTIOUS A: No evidence of acute infectious process. Surgical Prophylaxis  P:   Ancef w/ stop date 3/28 Monitor for fever & leukocytosis  ENDOCRINE A: S/P Total Thyroidectomy - Previously normal TSH.  P:   Monitor calcium level Synthroid IV daily  NEUROLOGIC A: Post-Op Pain Sedation on Ventilator  P:   RASS Goal:  -1 Propofol gtt Fentanyl IV prn  FAMILY UPDATE: updated by ENT 3/20  TODAY'S SUMMARY:  70 year old with goiter and questionable community acquired pneumonia. Patient failed spontaneous breathing trial this morning. Plan to repeat spontaneous breathing trial in a.m.  I have spent a total of 36 minutes of critical care time today caring for the patient and reviewing the patient's electronic medical record.  Donna Christen Jamison Neighbor, M.D. [facility] Pulmonary & Critical Care Pager:   [phone] After 3pm or if no response, call [phone] 02/29/2016, 7:34 AM

## 2016-02-29 NOTE — Progress Notes (Signed)
MD aware low UOP this AM. No residual on bladder scan. Orders received.

## 2016-03-01 LAB — GLUCOSE, CAPILLARY
GLUCOSE-CAPILLARY: 141 mg/dL — AB (ref 65–99)
GLUCOSE-CAPILLARY: 140 mg/dL — AB (ref 65–99)
GLUCOSE-CAPILLARY: 136 mg/dL — AB (ref 65–99)
GLUCOSE-CAPILLARY: 122 mg/dL — AB (ref 65–99)
Glucose-Capillary: 161 mg/dL — ABNORMAL HIGH (ref 65–99)
Glucose-Capillary: 181 mg/dL — ABNORMAL HIGH (ref 65–99)

## 2016-03-01 LAB — CBC WITH DIFFERENTIAL/PLATELET
BASOS ABS: 0 10*3/uL (ref 0.0–0.1)
BASOS PCT: 0 %
EOS ABS: 0 10*3/uL (ref 0.0–0.7)
EOS PCT: 0 %
HEMATOCRIT: 34.1 % — AB (ref 36.0–46.0)
Hemoglobin: 10.4 g/dL — ABNORMAL LOW (ref 12.0–15.0)
Lymphocytes Relative: 6 %
Lymphs Abs: 0.7 10*3/uL (ref 0.7–4.0)
MCH: 25.1 pg — AB (ref 26.0–34.0)
MCHC: 30.5 g/dL (ref 30.0–36.0)
MCV: 82.4 fL (ref 78.0–100.0)
Monocytes Absolute: 0.6 10*3/uL (ref 0.1–1.0)
Monocytes Relative: 5 %
Neutro Abs: 9.6 10*3/uL — ABNORMAL HIGH (ref 1.7–7.7)
Neutrophils Relative %: 89 %
Platelets: 320 10*3/uL (ref 150–400)
RBC: 4.14 MIL/uL (ref 3.87–5.11)
RDW: 17.7 % — AB (ref 11.5–15.5)
WBC: 10.8 10*3/uL — AB (ref 4.0–10.5)

## 2016-03-01 LAB — CULTURE, BLOOD (ROUTINE X 2)
CULTURE: NO GROWTH
Culture: NO GROWTH

## 2016-03-01 LAB — RENAL FUNCTION PANEL
ALBUMIN: 2 g/dL — AB (ref 3.5–5.0)
Anion gap: 10 (ref 5–15)
BUN: 31 mg/dL — AB (ref 6–20)
CO2: 25 mmol/L (ref 22–32)
CREATININE: 0.93 mg/dL (ref 0.44–1.00)
Calcium: 7.3 mg/dL — ABNORMAL LOW (ref 8.9–10.3)
Chloride: 103 mmol/L (ref 101–111)
GFR calc Af Amer: >60 mL/min (ref >60)
Glucose, Bld: 166 mg/dL — ABNORMAL HIGH (ref 65–99)
PHOSPHORUS: 3.2 mg/dL (ref 2.5–4.6)
Potassium: 4.6 mmol/L (ref 3.5–5.1)
Sodium: 138 mmol/L (ref 135–145)

## 2016-03-01 LAB — TRIGLYCERIDES: TRIGLYCERIDES: 147 mg/dL (ref <150)

## 2016-03-01 LAB — CALCIUM: Calcium: 7.3 mg/dL — ABNORMAL LOW (ref 8.9–10.3)

## 2016-03-01 LAB — MAGNESIUM: Magnesium: 1.9 mg/dL (ref 1.7–2.4)

## 2016-03-01 MED ORDER — FUROSEMIDE 10 MG/ML IJ SOLN
20.0000 mg | Freq: Two times a day (BID) | INTRAMUSCULAR | Status: DC
  Administered 2016-03-01: 20 mg via INTRAVENOUS
  Filled 2016-03-01: qty 2

## 2016-03-01 MED ORDER — PRO-STAT SUGAR FREE PO LIQD
60.0000 mL | Freq: Two times a day (BID) | ORAL | Status: DC
  Administered 2016-03-01 – 2016-03-02 (×2): 60 mL
  Filled 2016-03-01 (×2): qty 60

## 2016-03-01 MED ORDER — VITAL HIGH PROTEIN PO LIQD
1000.0000 mL | ORAL | Status: DC
  Administered 2016-03-01: 1000 mL

## 2016-03-01 MED ORDER — CHLORHEXIDINE GLUCONATE 0.12 % MT SOLN
OROMUCOSAL | Status: AC
  Filled 2016-03-01: qty 15

## 2016-03-01 MED ORDER — FUROSEMIDE 10 MG/ML IJ SOLN
40.0000 mg | Freq: Two times a day (BID) | INTRAMUSCULAR | Status: AC
  Administered 2016-03-01 – 2016-03-02 (×2): 40 mg via INTRAVENOUS
  Filled 2016-03-01 (×2): qty 4

## 2016-03-01 NOTE — Progress Notes (Signed)
Nutrition Follow-up  DOCUMENTATION CODES:   Morbid obesity  INTERVENTION:   -Resume tube feeding with Vital High Protein at goal rate of 30 ml/hr.  Provide 60 ml Prostat BID.   Tube feeding regimen and Propofol at current rate will provide a total of 1455 kcals, 123 grams of protein, and 602 ml of water.  NUTRITION DIAGNOSIS:   Inadequate oral intake related to inability to eat as evidenced by NPO status.  Ongoing  GOAL:   Provide needs based on ASPEN/SCCM guidelines  Met with TF  MONITOR:   Vent status, Labs, TF tolerance, Diet advancement, I & O's  ASSESSMENT:   70 y/o woman recently moved from [place] who presents with respiratory distress. 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  Tube feedings stopped at midnight on 3/21. S/p procedure on 3/21: total thyroidectomy  Patient remains intubated on ventilator support. Propofol: 12.7 ml/hr; providing 335 kcals from lipid each day. OG tube, tube feeding currently off.  RN states that patient failed spontaneous breathing trial this am and the tube feedings will be resumed today.   Medications reviewed: lasix, protonix.  Labs reviewed: elevated CBG's (141-181).   Diet Order:    NPO  Skin:  Wound (see comment) (Close incision on the neck)  Last BM:  Unknown  Height:   Ht Readings from Last 1 Encounters:  02/24/16 5' (1.524 m)    Weight:   Wt Readings from Last 1 Encounters:  03/01/16 239 lb 3.2 oz (108.5 kg)    Ideal Body Weight:  45.45 kg  BMI:  Body mass index is 46.72 kg/(m^2).  Estimated Nutritional Needs:   Kcal:  7670-1100  Protein:  >/= 110 gm  Fluid:  >/= 1.5 L  EDUCATION NEEDS:   No education needs identified at this time  Veronda Prude, Dietetic Intern Pager: [phone]

## 2016-03-01 NOTE — Progress Notes (Signed)
PULMONARY / CRITICAL CARE MEDICINE    Name: Kendra Osborne MRN: [id number] DOB: [birth year]    ADMISSION DATE:  02/24/2016  PRIMARY SERVICE: PCCM  CHIEF COMPLAINT:  Dyspnea  BRIEF PATIENT DESCRIPTION: 70 y/o woman recently moved from [place] who presents with respiratory distress. She presented to the ED on 3/17 with complaints of dyspnea, and lower extremity edema. History is limited due to language / culture barriers, but she has a PMHx of a known goiter that has been present for years, as well as possibly hypertension. It is not clear if the goiter was ever worked up or if she ever was on regular therapy for her hypertension. Her Nephew and son (who speaks little [place]) report that she been having a cough for a few days as well as some fevers and chills.  SUBJECTIVE: Patient underwent thyroidectomy 3/21 by Dr. Emeline Darling. No acute events overnight. Patient again failed spontaneous breathing trial this morning.  REVIEW OF SYSTEMS:  Unable to obtain on ventilator and language barrier.  VITAL SIGNS: Temp:  [98.2 F (36.8 C)-99.1 F (37.3 C)] 98.7 F (37.1 C) (03/23 0400) Pulse Rate:  [66-95] 69 (03/23 0716) Resp:  [14-50] 24 (03/23 0716) BP: (120-192)/(44-79) 149/63 mmHg (03/23 0716) SpO2:  [95 %-99 %] 96 % (03/23 0716) Arterial Line BP: (69-200)/(46-86) 148/66 mmHg (03/23 0700) FiO2 (%):  [40 %] 40 % (03/23 0716) Weight:  [108.5 kg (239 lb 3.2 oz)] 108.5 kg (239 lb 3.2 oz) (03/23 0357) HEMODYNAMICS:   VENTILATOR SETTINGS: Vent Mode:  [-] PRVC FiO2 (%):  [40 %] 40 % Set Rate:  [24 bmp] 24 bmp Vt Set:  [380 mL] 380 mL PEEP:  [5 cmH20] 5 cmH20 Plateau Pressure:  [18 cmH20-23 cmH20] 23 cmH20 INTAKE / OUTPUT: Intake/Output      03/22 0701 - 03/23 0700 03/23 0701 - 03/24 0700   I.V. (mL/kg) 230.7 (2.1)    NG/GT 50    IV Piggyback 400    Total Intake(mL/kg) 680.7 (6.3)    Urine (mL/kg/hr) 685 (0.3)    Emesis/NG output 170 (0.1)    Drains 75 (0)    Blood     Total Output 930      Net -249.3            PHYSICAL EXAMINATION: General:  Eyes open. No distress. No family at bedside.  Integument:  Warm & dry. No rash on exposed skin. Surgical incision on neck clean, dry, and intact. HEENT:  No scleral injection. Endotracheal tube in place. Surgical JP drains in place. Cardiovascular:  Regular rate. No edema. No appreciable JVD.  Pulmonary:  Good aeration bilaterally. Symmetric chest wall rise on ventilator. Patient had significant worsening of tachypnea with attempt at pressure support by me at bedside. Abdomen: Soft. Normal bowel sounds. Nondistended.  Neurological: Pupils reactive. Doesn't follow commands but again language barrier. Moving all 4 extremities spontaneously.  LABS:  CBC  Recent Labs Lab 02/28/16 0220 02/29/16 0434 03/01/16 0400  WBC 8.0 10.0 10.8*  HGB 10.9* 11.5* 10.4*  HCT 35.5* 37.9 34.1*  PLT 209 283 320   Coag's No results for input(s): APTT, INR in the last 168 hours. BMET  Recent Labs Lab 02/28/16 0220 02/29/16 0434 03/01/16 0400  NA 139 133* 138  K 3.6 4.2 4.6  CL 103 103 103  CO2 BUN 25* 15 31*  CREATININE 0.86 0.80 0.93  GLUCOSE 100* 152* 166*   Electrolytes  Recent Labs Lab 02/28/16 0220  02/29/16 0434 02/29/16 1546  03/01/16 0400  CALCIUM 7.9*  < > 7.5* 7.4* 7.3*  7.3*  MG 2.0  < > 1.7 2.0 1.9  PHOS 4.1  --  4.2  --  3.2  < > = values in this interval not displayed. Sepsis Markers No results for input(s): LATICACIDVEN, PROCALCITON, O2SATVEN in the last 168 hours. ABG  Recent Labs Lab 02/24/16 2249 02/25/16 0048 02/25/16 0336  PHART 7.176* 7.298* 7.435  PCO2ART 95.7* 75.7* 52.4*  PO2ART 93.0 127.0* 71.0*   Liver Enzymes  Recent Labs Lab 02/28/16 2300 03/01/16 0400  ALBUMIN 2.1* 2.0*   Cardiac Enzymes  Recent Labs Lab 02/24/16 2000  TROPONINI 0.14*   Glucose  Recent Labs Lab 02/29/16 0826 02/29/16 1152 02/29/16 1625 02/29/16 1926 03/01/16 0004 03/01/16 0333  GLUCAP 116*  128* 162* 160* 181* 141*    Imaging Dg Chest Port 1 View  02/29/2016  CLINICAL DATA:  Respiratory failure. EXAM: PORTABLE CHEST 1 VIEW COMPARISON:  02/28/2016. FINDINGS: Endotracheal tube, NG tube, left subclavian line in stable position. Cardiomegaly with mild pulmonary vascular prominence and bilateral interstitial prominence with left-sided pleural effusion. Findings suggest congestive heart failure. Pneumonitis cannot be excluded . IMPRESSION: 1. Lines and tubes in stable position. 2. Cardiomegaly with mild bilateral pulmonary interstitial prominence and left pleural effusion consistent with congestive heart failure. Pneumonitis cannot be excluded . Electronically Signed   By: Maisie Fushomas  Register   On: 02/29/2016 07:28   Dg Abd Portable 1v  02/29/2016  CLINICAL DATA:  NG tube placement EXAM: PORTABLE ABDOMEN - 1 VIEW COMPARISON:  02/25/2016 FINDINGS: Normal small bowel gas pattern. Again noted NG tube in place with tip in distal stomach. Again noted degenerative changes and mild dextroscoliosis lumbar spine. IMPRESSION: NG tube in place with tip in distal stomach. Electronically Signed   By: Natasha MeadLiviu  Pop M.D.   On: 02/29/2016 11:34   SIGNIFICANT EVENTS: 3/17 - Admit 3/21 - Thyroidectomy  STUDIES:  CT-A of Chest >> mild mediastinal enlargement CT of the Neck with contrast >> multinodular goiter, meningioma Port CXR 3/22:  ETT & CVL in good position. Blunting bilateral costophrenic angles suggestive of effusions. Bilateral hilar fullness & interstitial prominence with low lung volumes.   LINES / TUBES: OETT 7.5 3/17 - 3/21; 7.0 3/21>>> L Subclavian CVL 3/21>>> L Port 3/21>>> OGT 3/21>>> R & L Neck Drains 3/21>>> Foley 3/18>>> PIV x1 L Radial Art Line 3/21 - 3/23  MICROBIOLOGY: Blood Ctx x2 3/18>>> MRSA PCR 3/18:  Negative   ANTIBIOTICS: Ancef 3/21>>>(end date ordered for 3/28) Clindamycin 3/21 (only 1 dose) Azithromycin 3/18 - 3/19 Rocephin 3/18 - 3/20  ASSESSMENT /  PLAN:  PULMONARY A: Acute on Chronic Hypercarbic Respiratory Failure Acute Hypoxic Respiratory Failure - Question element of pulmonary edema vs viral bronchitis/pneumonitis. Extrinsic Airway Compression - Secondary to goiter.  P:   S/P Thyroidectomy 3/21 Full Vent Support  Spontaneous breathing trial in the morning Lasix IV q12hr x3 doses  CARDIOVASCULAR A: HTN - TTE 3/19 nml LV fn, gr 1 DD.  P:   Monitor on telemetry Vitals per unit protocol Hydralazine IV prn  RENAL A: Hyponatremia - Resolved. Acute Renal Failure - Resolved. Hypokalemia - Resolved. Hypomagnesemia - Resolved.  P:   Trending UOP with Foley Monitor electrolytes & renal function daily  GASTROINTESTINAL A: No acute issues.  P:   NPO  Protonix IV daily Resume Tube Feeds  HEMATOLOGIC A: Anemia - Mild.  P:   Trending cell counts daily w/ CBC SCDs  INFECTIOUS A: No evidence of  acute infectious process. Surgical Prophylaxis  P:   Ancef w/ stop date 3/28 Monitor for fever & leukocytosis  ENDOCRINE A: S/P Total Thyroidectomy - Previously normal TSH.  P:   Monitor calcium level w/ daily labs Synthroid IV daily  NEUROLOGIC A: Post-Op Pain Sedation on Ventilator  P:   RASS Goal: 0  Propofol gtt Fentanyl IV prn  FAMILY UPDATE: Updated by ENT 3/20. No family at bedside.  TODAY'S SUMMARY:  70 year old with goiter and questionable community acquired pneumonia. Patient failed spontaneous breathing trial this morning. Plan to repeat spontaneous breathing trial in a.m. after further diuresis. Arterial line discontinued today. Given patient's low albumin restarting tube feeds for further nutritional support.  I have spent a total of 34 minutes of critical care time today caring for the patient and reviewing the patient's electronic medical record.  Donna Christen Jamison Neighbor, M.D. [facility] Pulmonary & Critical Care Pager:  [phone] After 3pm or if no response, call  [phone] 03/01/2016, 7:54 AM

## 2016-03-01 NOTE — Progress Notes (Signed)
Subjective: POD#2 from total thyroidectomy. Her pathology showed large benign goiter with no parathyroid tissue removed. Her calciums have been stable at corrected 8.9 to 9 and her drain output has been decreasing appropriately. She is still on full vent support.  Objective: Vital signs in last 24 hours: Temp:  [98.3 F (36.8 C)-99 F (37.2 C)] 98.6 F (37 C) (03/23 1600) Pulse Rate:  [61-82] 63 (03/23 1600) Resp:  [17-40] 24 (03/23 1600) BP: (119-161)/(53-74) 126/58 mmHg (03/23 1600) SpO2:  [95 %-100 %] 98 % (03/23 1600) Arterial Line BP: (110-180)/(52-74) 148/66 mmHg (03/23 0700) FiO2 (%):  [40 %] 40 % (03/23 1549) Weight:  [108.5 kg (239 lb 3.2 oz)] 108.5 kg (239 lb 3.2 oz) (03/23 0357)  Intubated, sedated. Jp drains both with approximately 96m of serosanguinous drainage bilaterally. Incision is clean dry and intact with absorbable sutures and neosporin ointment. Neck supple.  _0 (wbc:2,hgb:2,hct:2,plt:2)  Recent Labs  02/29/16 0434 02/29/16 1546 03/01/16 0400  NA 133*  --  138  K 4.2  --  4.6  CL 103  --  103  CO2 23  --  25  GLUCOSE 152*  --  166*  BUN 15  --  31*  CREATININE 0.80  --  0.93  CALCIUM 7.5* 7.4* 7.3*  7.3*    Medications:  Scheduled Meds: . antiseptic oral rinse  7 mL Mouth Rinse 10 times per day  . bacitracin   Topical TID  .  ceFAZolin (ANCEF) IV  2 g Intravenous 3 times per day  . chlorhexidine gluconate (SAGE KIT)  15 mL Mouth Rinse BID  . feeding supplement (PRO-STAT SUGAR FREE 64)  60 mL Per Tube BID  . feeding supplement (VITAL HIGH PROTEIN)  1,000 mL Per Tube Q24H  . furosemide  40 mg Intravenous Q12H  . levothyroxine  75 mcg Intravenous QAC breakfast  . pantoprazole (PROTONIX) IV  40 mg Intravenous Q24H   Continuous Infusions: . 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 Will likely remove her JP drains tomorrow if the JP output continues to decrease. Continue vent wean as possible, if she cannot be weaned off of the vent may still need a tracheotomy but will monitor. Will need lifetime synthroid once she has recovered from her acute illness, on IV levothyroxine for now.   LOS: 5 days   GRuby Cola3/23/2017, 5:24 PM

## 2016-03-02 ENCOUNTER — Inpatient Hospital Stay (HOSPITAL_COMMUNITY): Payer: Medicaid Other

## 2016-03-02 LAB — BLOOD GAS, ARTERIAL
ACID-BASE EXCESS: 3.3 mmol/L — AB (ref 0.0–2.0)
Bicarbonate: 28 mEq/L — ABNORMAL HIGH (ref 20.0–24.0)
Drawn by: 406621
FIO2: 0.3
O2 SAT: 97.4 %
PCO2 ART: 48.2 mmHg — AB (ref 35.0–45.0)
PEEP/CPAP: 5 cmH2O
PRESSURE SUPPORT: 5 cmH2O
Patient temperature: 98.6
TCO2: 29.5 mmol/L (ref 0–100)
pH, Arterial: 7.382 (ref 7.350–7.450)
pO2, Arterial: 109 mmHg — ABNORMAL HIGH (ref 80.0–100.0)

## 2016-03-02 LAB — BASIC METABOLIC PANEL
Anion gap: 11 (ref 5–15)
BUN: 39 mg/dL — ABNORMAL HIGH (ref 6–20)
CHLORIDE: 103 mmol/L (ref 101–111)
CO2: 29 mmol/L (ref 22–32)
CREATININE: 0.87 mg/dL (ref 0.44–1.00)
Calcium: 7.2 mg/dL — ABNORMAL LOW (ref 8.9–10.3)
GFR calc non Af Amer: >60 mL/min (ref >60)
Glucose, Bld: 106 mg/dL — ABNORMAL HIGH (ref 65–99)
POTASSIUM: 3.3 mmol/L — AB (ref 3.5–5.1)
Sodium: 143 mmol/L (ref 135–145)

## 2016-03-02 LAB — RENAL FUNCTION PANEL
Albumin: 2 g/dL — ABNORMAL LOW (ref 3.5–5.0)
Anion gap: 9 (ref 5–15)
BUN: 43 mg/dL — ABNORMAL HIGH (ref 6–20)
CALCIUM: 7.1 mg/dL — AB (ref 8.9–10.3)
CHLORIDE: 104 mmol/L (ref 101–111)
CO2: 27 mmol/L (ref 22–32)
CREATININE: 0.9 mg/dL (ref 0.44–1.00)
Glucose, Bld: 131 mg/dL — ABNORMAL HIGH (ref 65–99)
Phosphorus: 3 mg/dL (ref 2.5–4.6)
Potassium: 2.9 mmol/L — ABNORMAL LOW (ref 3.5–5.1)
SODIUM: 140 mmol/L (ref 135–145)

## 2016-03-02 LAB — MAGNESIUM: MAGNESIUM: 1.7 mg/dL (ref 1.7–2.4)

## 2016-03-02 LAB — GLUCOSE, CAPILLARY
GLUCOSE-CAPILLARY: 113 mg/dL — AB (ref 65–99)
GLUCOSE-CAPILLARY: 119 mg/dL — AB (ref 65–99)
GLUCOSE-CAPILLARY: 129 mg/dL — AB (ref 65–99)
GLUCOSE-CAPILLARY: 154 mg/dL — AB (ref 65–99)
GLUCOSE-CAPILLARY: 96 mg/dL (ref 65–99)
Glucose-Capillary: 111 mg/dL — ABNORMAL HIGH (ref 65–99)
Glucose-Capillary: 106 mg/dL — ABNORMAL HIGH (ref 65–99)

## 2016-03-02 LAB — CBC WITH DIFFERENTIAL/PLATELET
Basophils Absolute: 0 10*3/uL (ref 0.0–0.1)
Basophils Relative: 0 %
EOS ABS: 0.1 10*3/uL (ref 0.0–0.7)
Eosinophils Relative: 1 %
HCT: 33.4 % — ABNORMAL LOW (ref 36.0–46.0)
HEMOGLOBIN: 10.2 g/dL — AB (ref 12.0–15.0)
LYMPHS ABS: 2.2 10*3/uL (ref 0.7–4.0)
Lymphocytes Relative: 22 %
MCH: 25 pg — AB (ref 26.0–34.0)
MCHC: 30.5 g/dL (ref 30.0–36.0)
MCV: 81.9 fL (ref 78.0–100.0)
Monocytes Absolute: 1.1 10*3/uL — ABNORMAL HIGH (ref 0.1–1.0)
Monocytes Relative: 11 %
NEUTROS PCT: 66 %
Neutro Abs: 6.6 10*3/uL (ref 1.7–7.7)
Platelets: 263 10*3/uL (ref 150–400)
RBC: 4.08 MIL/uL (ref 3.87–5.11)
RDW: 17.9 % — ABNORMAL HIGH (ref 11.5–15.5)
WBC: 10.1 10*3/uL (ref 4.0–10.5)

## 2016-03-02 LAB — TRIGLYCERIDES
TRIGLYCERIDES: 256 mg/dL — AB (ref <150)
Triglycerides: 167 mg/dL — ABNORMAL HIGH (ref <150)

## 2016-03-02 MED ORDER — FENTANYL CITRATE (PF) 100 MCG/2ML IJ SOLN
INTRAMUSCULAR | Status: AC
  Administered 2016-03-02: 100 ug
  Filled 2016-03-02: qty 4

## 2016-03-02 MED ORDER — FUROSEMIDE 10 MG/ML IJ SOLN
40.0000 mg | Freq: Two times a day (BID) | INTRAMUSCULAR | Status: DC
  Administered 2016-03-02 – 2016-03-28 (×52): 40 mg via INTRAVENOUS
  Filled 2016-03-02 (×54): qty 4

## 2016-03-02 MED ORDER — SODIUM CHLORIDE 0.9 % IV SOLN
3.0000 g | Freq: Four times a day (QID) | INTRAVENOUS | Status: DC
  Administered 2016-03-02 – 2016-03-12 (×40): 3 g via INTRAVENOUS
  Filled 2016-03-02 (×42): qty 3

## 2016-03-02 MED ORDER — SODIUM CHLORIDE 0.9 % IV SOLN
25.0000 ug/h | INTRAVENOUS | Status: DC
  Administered 2016-03-02: 50 ug/h via INTRAVENOUS
  Administered 2016-03-03: 125 ug/h via INTRAVENOUS
  Administered 2016-03-04: 200 ug/h via INTRAVENOUS
  Administered 2016-03-05: 125 ug/h via INTRAVENOUS
  Administered 2016-03-05: 200 ug/h via INTRAVENOUS
  Administered 2016-03-06 – 2016-03-07 (×2): 150 ug/h via INTRAVENOUS
  Administered 2016-03-08 – 2016-03-09 (×4): 175 ug/h via INTRAVENOUS
  Administered 2016-03-09 – 2016-03-10 (×2): 200 ug/h via INTRAVENOUS
  Administered 2016-03-11: 175 ug/h via INTRAVENOUS
  Filled 2016-03-02 (×14): qty 50

## 2016-03-02 MED ORDER — FENTANYL CITRATE (PF) 100 MCG/2ML IJ SOLN
50.0000 ug | INTRAMUSCULAR | Status: DC | PRN
  Administered 2016-03-12 – 2016-03-14 (×10): 50 ug via INTRAVENOUS
  Filled 2016-03-02 (×10): qty 2

## 2016-03-02 MED ORDER — PROPOFOL 1000 MG/100ML IV EMUL
0.0000 ug/kg/min | INTRAVENOUS | Status: DC
  Administered 2016-03-02: 20 ug/kg/min via INTRAVENOUS
  Administered 2016-03-03: 15 ug/kg/min via INTRAVENOUS
  Administered 2016-03-03: 20 ug/kg/min via INTRAVENOUS
  Administered 2016-03-03 – 2016-03-07 (×7): 15 ug/kg/min via INTRAVENOUS
  Filled 2016-03-02 (×12): qty 100

## 2016-03-02 MED ORDER — POTASSIUM CHLORIDE 20 MEQ/15ML (10%) PO SOLN
40.0000 meq | ORAL | Status: AC
  Administered 2016-03-02 (×2): 40 meq
  Filled 2016-03-02 (×2): qty 30

## 2016-03-02 MED ORDER — PROPOFOL 1000 MG/100ML IV EMUL
INTRAVENOUS | Status: AC
  Filled 2016-03-02: qty 100

## 2016-03-02 MED ORDER — FENTANYL CITRATE (PF) 100 MCG/2ML IJ SOLN
50.0000 ug | INTRAMUSCULAR | Status: DC | PRN
  Administered 2016-03-11: 50 ug via INTRAVENOUS
  Filled 2016-03-02: qty 2

## 2016-03-02 MED ORDER — LEVOTHYROXINE SODIUM 100 MCG PO TABS
100.0000 ug | ORAL_TABLET | Freq: Every day | ORAL | Status: DC
  Administered 2016-03-03 – 2016-03-17 (×13): 100 ug via ORAL
  Filled 2016-03-02 (×13): qty 1

## 2016-03-02 MED ORDER — PANTOPRAZOLE SODIUM 40 MG IV SOLR
40.0000 mg | INTRAVENOUS | Status: DC
  Administered 2016-03-02 – 2016-03-03 (×2): 40 mg via INTRAVENOUS
  Filled 2016-03-02 (×2): qty 40

## 2016-03-02 MED ORDER — LEVOTHYROXINE SODIUM 100 MCG IV SOLR
50.0000 ug | Freq: Every day | INTRAVENOUS | Status: DC
Start: 2016-03-03 — End: 2016-03-02

## 2016-03-02 MED ORDER — POTASSIUM CHLORIDE 10 MEQ/50ML IV SOLN
10.0000 meq | INTRAVENOUS | Status: AC
  Administered 2016-03-02 – 2016-03-03 (×4): 10 meq via INTRAVENOUS
  Filled 2016-03-02 (×4): qty 50

## 2016-03-02 NOTE — Progress Notes (Signed)
Pt has orders for extubation to bipap. Pt found to NOT have a cuff leak prior. Dr Vassie LollAlva notified and orders given to extubate anyways and he would be up to see her soon. RT will closely monitor.

## 2016-03-02 NOTE — Progress Notes (Signed)
Pharmacy Antibiotic Note  Juliane Pootaseem Spillman is a 70 y.o. female admitted on 02/24/2016 with pneumonia.  Pharmacy has been consulted for unasyn dosing. Pt is afebrile and WBC is WNL. SCr is WNL.   Plan: - Unasyn 3gm IV Q6H - F/u renal fxn, C&S, clinical status *Pharmacy will sign-off and only follow peripherally as no dose adjustments are anticipated. Thank you for the consult!  Height: 5' (152.4 cm) Weight: 238 lb 1.6 oz (108 kg) IBW/kg (Calculated) : 45.5  Temp (24hrs), Avg:98.5 F (36.9 C), Min:98.1 F (36.7 C), Max:98.6 F (37 C)   Recent Labs Lab 02/27/16 0254 02/28/16 0220 02/29/16 0434 03/01/16 0400 03/02/16 0540  WBC 10.2 8.0 10.0 10.8* 10.1  CREATININE 0.93 0.86 0.80 0.93 0.90    Estimated Creatinine Clearance: 66.6 mL/min (by C-G formula based on Cr of 0.9).    Not on File  Antimicrobials this admission: Unasyn 3/24>>  Dose adjustments this admission: N/A  Microbiology results: 3/18 Blood - NEG 3/18 MRSA - NEG  Thank you for allowing pharmacy to be a part of this patient's care.  Chrishun Scheer, Drake LeachRachel Lynn 03/02/2016 2:10 PM

## 2016-03-02 NOTE — Progress Notes (Signed)
She was extubated She had upper airway secretions and required nasotracheal suction, had a decent cough. However within a couple of hours she became more hypoxic requiring high flow oxygen and was noted to be tiring out. She was then reintubated-slightly difficult airway due to grade 3 visualization and anterior larynx. Glyde scope used, some pooling of gastric secretions noted -hence presumed aspiration pneumonia and will broaden coverage with Unasyn.  Ventilator settings were reviewed and adjusted. Chest x-ray confirmed tube position Sedation protocol used-propofol drip and intermittent fentanyl Son updated at bedside Additional critical care time x 30 mins  Cyril Mourningakesh Jacklyne Baik MD. [facility] HospitalFCCP. [facility] Pager [phone] If no response call [phone]   03/02/2016

## 2016-03-02 NOTE — Progress Notes (Signed)
Subjective: POD#3 from total thyroidectomy. Intubated, on vent. Drain output only 64m per drain yesterday  Objective: Vital signs in last 24 hours: Temp:  [98.3 F (36.8 C)-98.7 F (37.1 C)] 98.6 F (37 C) (03/23 2010) Pulse Rate:  [58-82] 59 (03/23 2323) Resp:  [17-39] 24 (03/23 2323) BP: (119-159)/(53-74) 136/65 mmHg (03/23 2300) SpO2:  [95 %-100 %] 97 % (03/23 2323) Arterial Line BP: (134-148)/(57-69) 148/66 mmHg (03/23 0700) FiO2 (%):  [40 %] 40 % (03/23 2323) Weight:  [108.5 kg (239 lb 3.2 oz)] 108.5 kg (239 lb 3.2 oz) (03/23 0357)  Intubated/sedated but awakens to stimuli. I removed the JP drains with scissors and adsons, neck supple and flat, incision clean dry and intact  _0 (wbc:2,hgb:2,hct:2,plt:2)  Recent Labs  02/29/16 0434 02/29/16 1546 03/01/16 0400  NA 133*  --  138  K 4.2  --  4.6  CL 103  --  103  CO2 23  --  25  GLUCOSE 152*  --  166*  BUN 15  --  31*  CREATININE 0.80  --  0.93  CALCIUM 7.5* 7.4* 7.3*  7.3*    Medications:  No current facility-administered medications on file prior to encounter.   No current outpatient prescriptions on file prior to encounter.   Scheduled Meds: . antiseptic oral rinse  7 mL Mouth Rinse 10 times per day  . bacitracin   Topical TID  .  ceFAZolin (ANCEF) IV  2 g Intravenous 3 times per day  . chlorhexidine gluconate (SAGE KIT)  15 mL Mouth Rinse BID  . feeding supplement (PRO-STAT SUGAR FREE 64)  60 mL Per Tube BID  . feeding supplement (VITAL HIGH PROTEIN)  1,000 mL Per Tube Q24H  . furosemide  40 mg Intravenous Q12H  . levothyroxine  75 mcg Intravenous QAC breakfast  . pantoprazole (PROTONIX) IV  40 mg Intravenous Q24H   Continuous Infusions: . propofol (DIPRIVAN) infusion 30 mcg/kg/min (03/01/16 2300)   PRN Meds:.sodium chloride, fentaNYL (SUBLIMAZE) injection, fentaNYL (SUBLIMAZE) injection, hydrALAZINE, iohexol, iohexol, iohexol, midazolam, midazolam  Assessment/Plan: POD#3 from total  thyroidectomy. Pathology showed benign large goiter. JP drains removed today. Corrected calciums have been normal at 8.9-9 and stable and no parathyroids were noted in thyroidectomy specimen. On IV synthroid   LOS: 6 days   GRuby Cola3/24/2017, 12:15 AM

## 2016-03-02 NOTE — Progress Notes (Addendum)
Pt extubated earlier today, but became hypoxic and required reintubation approximately 2 hours later.  Currently remains sedated and on ventilator.  Will continue to follow progress.   Will follow.    Quintella BatonJulie W. Wava Kildow, RN, BSN  Trauma/Neuro ICU Case Manager [phone]

## 2016-03-02 NOTE — Progress Notes (Signed)
[facility] FOR AM LAB REPLACEMENT ONLY  The patient does apply for the [facility] Electrolyte Replacment Protocol based on the criteria listed below:   1. Is GFR >/= 40 ml/min? Yes.    Patient's GFR today is >60 2. Is urine output >/= 0.5 ml/kg/hr for the last 6 hours? Yes.   Patient's UOP is 1.23 ml/kg/hr 3. Is BUN < 60 mg/dL? Yes.    Patient's BUN today is 43 4. Abnormal electrolyte(s):  K - 2.9 5. Ordered repletion with: PER PROTOCOL 6. If a panic level lab has been reported, has the CCM MD in charge been notified? Yes.  .   Physician:  Dr. Ocie CornfieldSommer  Bashir Marchetti C Kazuki Ingle 03/02/2016 6:52 AM

## 2016-03-02 NOTE — Procedures (Signed)
Extubation Procedure Note  Patient Details:   Name: Kendra Osborne DOB: 04/04/1946 MRN: [id number]   Airway Documentation:     Evaluation  O2 sats: transiently fell during during procedure and currently acceptable Complications: No apparent complications Patient did tolerate procedure well. Bilateral Breath Sounds: Clear, Diminished Suctioning: Oral, Airway No   Pt extubated to 6L [place] eventually increased to HFNC at 12LNC per MD order. Dr Vassie LollAlva at bedside throughout. Pt spo2 decreased to low 60's during coughing, but would slowly come back up to within normal limits. Pt with copious oral secretions and was NT suctioning x2 (left nare). Pt with good cough but unable to remove secretions without help from RN or RT. Bipap PRN per MD but hold off if continues to have increased secretions. Pt will be closely monitored by RT and RN.   Carolan ShiverKelley, Horice Carrero M 03/02/2016, 11:59 AM

## 2016-03-02 NOTE — Progress Notes (Signed)
eLink Physician-Brief Progress Note Patient Name: Kendra Osborne DOB: 07/26/1946 MRN: [id number]   Date of Service  03/02/2016  HPI/Events of Note  K+ = 3.3 and Creatinine = 0.87  eICU Interventions  Will replete K+.     Intervention Category Intermediate Interventions: Electrolyte abnormality - evaluation and management  Sommer,Steven Eugene 03/02/2016, 9:48 PM

## 2016-03-02 NOTE — Procedures (Signed)
Intubation Procedure Note Kendra Osborne [id number] 12/23/1945  Procedure: Intubation Indications: Respiratory insufficiency  Procedure Details Consent: Unable to obtain consent because of emergent medical necessity. Time Out: Verified patient identification, verified procedure, site/side was marked, verified correct patient position, special equipment/implants available, medications/allergies/relevent history reviewed, required imaging and test results available.  Performed  Maximum sterile technique was used including gloves, hand hygiene and mask.  MAC and 3  Versed 2 fent 100  Etomidate 20  Evaluation Hemodynamic Status: BP stable throughout; O2 sats: transiently fell during during procedure Patient's Current Condition: stable Complications: Complications of suspected aspiration of gastric secretions Patient did tolerate procedure well. Chest X-ray ordered to verify placement.  CXR: pending.   Kendra Osborne V. 03/02/2016

## 2016-03-02 NOTE — Progress Notes (Signed)
Nutrition Follow-up  DOCUMENTATION CODES:   Morbid obesity  INTERVENTION:   - If patient unable to be extubated, recommend resuming enteral nutrition with Vital High Protein at 30 ml/hr to increase by 10 ml every 4 hours to a goal rate of 55 ml/hr.  Recommend discontinuing Prostat.  Tube feeding regimen and Propofol at current rate would provide a total of 1489 kcals, 116 grams of protein, and 1104 ml of water.  NUTRITION DIAGNOSIS:   Inadequate oral intake related to inability to eat as evidenced by NPO status.  Ongoing  GOAL:   Provide needs based on ASPEN/SCCM guidelines  Currently unmet (TF off)  MONITOR:   Vent status, Labs, TF tolerance, Diet advancement, I & O's  ASSESSMENT:   70 y/o woman [place] moved from [name] who presents with respiratory distress. 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  3/21 at midnight: Tube feedings stopped 3/21: S/p total thyroidectomy procedure 3/24: tube feedings stopped; patient extubated to BiPAP then reintubated several hours later per RN  Patient is currently intubated on ventilator support. Propofol: 6.4 ml/hr, provides 169 kcals from lipid. OG tube, tube feedings currently off  Medications: lasix, protonix.  Labs reviewed: CBGs (111-129), low potassium (2.9), elevated triglycerides (256).  Diet Order:  Diet NPO time specified  Skin:  Wound (see comment) (Close incision on the neck)  Last BM:  Unknown  Height:   Ht Readings from Last 1 Encounters:  02/24/16 5' (1.524 m)    Weight:   Wt Readings from Last 1 Encounters:  03/02/16 238 lb 1.6 oz (108 kg)    Ideal Body Weight:  45.45 kg  BMI:  Body mass index is 46.5 kg/(m^2).  Estimated Nutritional Needs:   Kcal:  [id number]  Protein:  >/= 110 gm  Fluid:  >/= 1.5 L  EDUCATION NEEDS:   No education needs identified at this time  Doroteo Glassmanoleman Umeka Wrench, Dietetic Intern Pager: [phone]

## 2016-03-02 NOTE — Progress Notes (Signed)
PULMONARY / CRITICAL CARE MEDICINE    Name: Kendra Osborne MRN: [id number] DOB: 1946/11/04    ADMISSION DATE:  02/24/2016  PRIMARY SERVICE: PCCM  CHIEF COMPLAINT:  Dyspnea  BRIEF PATIENT DESCRIPTION: 70 y/o woman recently moved from [place] who presents with respiratory distress. She presented to the ED on 3/17 with complaints of dyspnea, and lower extremity edema. History is limited due to language / culture barriers, but she has a PMHx of a known goiter that has been present for years, as well as possibly hypertension. It is not clear if the goiter was ever worked up or if she ever was on regular therapy for her hypertension. Her Nephew and son (who speaks little [place]) report that she been having a cough for a few days as well as some fevers and chills.  SUBJECTIVE:  No acute events overnight.  Afebrile No obvious pain  REVIEW OF SYSTEMS:  Unable to obtain on ventilator and language barrier.  VITAL SIGNS: Temp:  [98.1 F (36.7 C)-98.6 F (37 C)] 98.1 F (36.7 C) (03/24 0800) Pulse Rate:  [57-79] 59 (03/24 0718) Resp:  [21-39] 24 (03/24 0718) BP: (120-172)/(54-76) 150/67 mmHg (03/24 0718) SpO2:  [95 %-100 %] 95 % (03/24 1150) FiO2 (%):  [40 %] 40 % (03/24 0718) Weight:  [238 lb 1.6 oz (108 kg)] 238 lb 1.6 oz (108 kg) (03/24 0500) HEMODYNAMICS:   VENTILATOR SETTINGS: Vent Mode:  [-] PRVC FiO2 (%):  [40 %] 40 % Set Rate:  [24 bmp] 24 bmp Vt Set:  [380 mL] 380 mL PEEP:  [5 cmH20] 5 cmH20 Plateau Pressure:  [14 cmH20-20 cmH20] 14 cmH20 INTAKE / OUTPUT: Intake/Output      03/23 0701 - 03/24 0700 03/24 0701 - 03/25 0700   I.V. (mL/kg) 386.7 (3.6)    NG/GT 710    IV Piggyback 100    Total Intake(mL/kg) 1196.7 (11.1)    Urine (mL/kg/hr) 2585 (1)    Emesis/NG output     Drains 80 (0)    Total Output 2665     Net -1468.3            PHYSICAL EXAMINATION: General:  Eyes open. No distress. No family at bedside.  Integument:  Warm & dry. No rash on exposed skin. Surgical  incision on neck clean, dry, and intact. HEENT:  No scleral injection. Endotracheal tube in place. Surgical JP drains in place. Cardiovascular:  Regular rate. No edema. No appreciable JVD.  Pulmonary:  Good aeration bilaterally. Symmetric chest wall rise on ventilator. Abdomen: Soft. Normal bowel sounds. Nondistended.  Neurological: Pupils reactive. Doesn't follow commands but again language barrier. Moving all 4 extremities spontaneously.  LABS:  CBC  Recent Labs Lab 02/29/16 0434 03/01/16 0400 03/02/16 0540  WBC 10.0 10.8* 10.1  HGB 11.5* 10.4* 10.2*  HCT 37.9 34.1* 33.4*  PLT 283 320 263   Coag's No results for input(s): APTT, INR in the last 168 hours. BMET  Recent Labs Lab 02/29/16 0434 03/01/16 0400 03/02/16 0540  NA 133* 138 140  K 4.2 4.6 2.9*  CL 103 103 104  CO2 BUN 15 31* 43*  CREATININE 0.80 0.93 0.90  GLUCOSE 152* 166* 131*   Electrolytes  Recent Labs Lab 02/29/16 0434 02/29/16 1546 03/01/16 0400 03/02/16 0540  CALCIUM 7.5* 7.4* 7.3*  7.3* 7.1*  MG 1.7 2.0 1.9 1.7  PHOS 4.2  --  3.2 3.0   Sepsis Markers No results for input(s): LATICACIDVEN, PROCALCITON, O2SATVEN in the last 168  hours. ABG  Recent Labs Lab 02/25/16 0048 02/25/16 0336 03/02/16 1055  PHART 7.298* 7.435 7.382  PCO2ART 75.7* 52.4* 48.2*  PO2ART 127.0* 71.0* 109*   Liver Enzymes  Recent Labs Lab 02/28/16 2300 03/01/16 0400 03/02/16 0540  ALBUMIN 2.1* 2.0* 2.0*   Cardiac Enzymes  Recent Labs Lab 02/24/16 2000  TROPONINI 0.14*   Glucose  Recent Labs Lab 03/01/16 1209 03/01/16 1605 03/01/16 2036 03/01/16 2357 03/02/16 0341 03/02/16 0754  GLUCAP 140* 136* 122* 154* 111* 129*    Imaging No results found. SIGNIFICANT EVENTS: 3/17 - Admit 3/21 - Thyroidectomy  STUDIES:  CT-A of Chest >> mild mediastinal enlargement CT of the Neck with contrast >> multinodular goiter, meningioma Port CXR 3/22:  ETT & CVL in good position. Blunting  bilateral costophrenic angles suggestive of effusions. Bilateral hilar fullness & interstitial prominence with low lung volumes.   LINES / TUBES: OETT 7.5 3/17 - 3/21; 7.0 3/21>>> L Subclavian CVL 3/21>>> L Port 3/21>>> OGT 3/21>>> R & L Neck Drains 3/21>>> Foley 3/18>>> PIV x1 L Radial Art Line 3/21 - 3/23  MICROBIOLOGY: Blood Ctx x2 3/18>>>ng MRSA PCR 3/18:  Negative   ANTIBIOTICS: Ancef 3/21>>>(end date ordered for 3/28) Clindamycin 3/21 (only 1 dose) Azithromycin 3/18 - 3/19 Rocephin 3/18 - 3/20  ASSESSMENT / PLAN:  PULMONARY A: Acute on Chronic Hypercarbic Respiratory Failure Acute Hypoxic Respiratory Failure - Question element of pulmonary edema vs viral bronchitis/pneumonitis. Extrinsic Airway Compression - Secondary to goiter.S/P Thyroidectomy 3/21  P:   Extubate - use bipap prn Lasix IV q12hr  CARDIOVASCULAR A: HTN - TTE 3/19 nml LV fn, gr 1 DD.  P:   telemetry Hydralazine IV prn  RENAL A: Hyponatremia - Resolved. Acute Renal Failure - Resolved. Hypokalemia  Hypomagnesemia - Resolved.  P:   Trending UOP with Foley while on lasix Monitor electrolytes & renal function daily  GASTROINTESTINAL A: No acute issues.  P:   NPO  Protonix IV daily Swallow eval post extubation  HEMATOLOGIC A: Anemia - Mild.  P:   Trending cell counts daily w/ CBC SCDs  INFECTIOUS A: ? CAP vs aspiration Surgical Prophylaxis  P:   Dc Ancef if CXR clear on 3/25 Monitor for fever & leukocytosis  ENDOCRINE A: S/P Total Thyroidectomy - Previously normal TSH.  P:   Monitor calcium level w/ daily labs Synthroid 75mcg IV daily - will need lifelong supplementation  NEUROLOGIC A: Post-Op Pain  P:   RASS Goal: 0  Fentanyl IV prn  FAMILY UPDATE:son Naveed 3/24  TODAY'S SUMMARY:  70 year old with large goiter and questionable community acquired vs aspiration  pneumonia.   The patient is critically ill with multiple organ systems failure and requires  high complexity decision making for assessment and support, frequent evaluation and titration of therapies, application of advanced monitoring technologies and extensive interpretation of multiple databases. Critical Care Time devoted to patient care services described in this note independent of APP time is 35 minutes.    Cyril Mourningakesh Alva MD. Tonny BollmanFCCP. [facility] Pager [phone] If no response call [phone]   03/02/2016   03/02/2016, 1:08 PM

## 2016-03-03 ENCOUNTER — Inpatient Hospital Stay (HOSPITAL_COMMUNITY): Payer: Medicaid Other

## 2016-03-03 LAB — CBC WITH DIFFERENTIAL/PLATELET
Basophils Absolute: 0 10*3/uL (ref 0.0–0.1)
Basophils Relative: 0 %
EOS PCT: 4 %
Eosinophils Absolute: 0.4 10*3/uL (ref 0.0–0.7)
HCT: 32.7 % — ABNORMAL LOW (ref 36.0–46.0)
Hemoglobin: 10.2 g/dL — ABNORMAL LOW (ref 12.0–15.0)
LYMPHS ABS: 1.9 10*3/uL (ref 0.7–4.0)
LYMPHS PCT: 17 %
MCH: 26 pg (ref 26.0–34.0)
MCHC: 31.2 g/dL (ref 30.0–36.0)
MCV: 83.4 fL (ref 78.0–100.0)
MONO ABS: 1.1 10*3/uL — AB (ref 0.1–1.0)
MONOS PCT: 10 %
Neutro Abs: 7.6 10*3/uL (ref 1.7–7.7)
Neutrophils Relative %: 69 %
PLATELETS: 251 10*3/uL (ref 150–400)
RBC: 3.92 MIL/uL (ref 3.87–5.11)
RDW: 18 % — AB (ref 11.5–15.5)
WBC: 11 10*3/uL — ABNORMAL HIGH (ref 4.0–10.5)

## 2016-03-03 LAB — GLUCOSE, CAPILLARY
GLUCOSE-CAPILLARY: 93 mg/dL (ref 65–99)
GLUCOSE-CAPILLARY: 94 mg/dL (ref 65–99)
Glucose-Capillary: 101 mg/dL — ABNORMAL HIGH (ref 65–99)
Glucose-Capillary: 109 mg/dL — ABNORMAL HIGH (ref 65–99)
Glucose-Capillary: 108 mg/dL — ABNORMAL HIGH (ref 65–99)

## 2016-03-03 LAB — BASIC METABOLIC PANEL
Anion gap: 9 (ref 5–15)
BUN: 33 mg/dL — AB (ref 6–20)
CHLORIDE: 107 mmol/L (ref 101–111)
CO2: 30 mmol/L (ref 22–32)
CREATININE: 0.81 mg/dL (ref 0.44–1.00)
Calcium: 7.2 mg/dL — ABNORMAL LOW (ref 8.9–10.3)
GFR calc Af Amer: >60 mL/min (ref >60)
GFR calc non Af Amer: >60 mL/min (ref >60)
GLUCOSE: 110 mg/dL — AB (ref 65–99)
Potassium: 3.6 mmol/L (ref 3.5–5.1)
Sodium: 146 mmol/L — ABNORMAL HIGH (ref 135–145)

## 2016-03-03 LAB — PHOSPHORUS: Phosphorus: 2.5 mg/dL (ref 2.5–4.6)

## 2016-03-03 LAB — MAGNESIUM
MAGNESIUM: 1.9 mg/dL (ref 1.7–2.4)
Magnesium: 1.5 mg/dL — ABNORMAL LOW (ref 1.7–2.4)

## 2016-03-03 LAB — ALBUMIN: Albumin: 2 g/dL — ABNORMAL LOW (ref 3.5–5.0)

## 2016-03-03 MED ORDER — SODIUM CHLORIDE 0.9 % IV SOLN
6.0000 g | Freq: Once | INTRAVENOUS | Status: AC
  Administered 2016-03-03: 6 g via INTRAVENOUS
  Filled 2016-03-03: qty 12

## 2016-03-03 MED ORDER — HEPARIN SODIUM (PORCINE) 5000 UNIT/ML IJ SOLN
5000.0000 [IU] | Freq: Three times a day (TID) | INTRAMUSCULAR | Status: DC
  Administered 2016-03-03 – 2016-04-09 (×102): 5000 [IU] via SUBCUTANEOUS
  Filled 2016-03-03 (×106): qty 1

## 2016-03-03 MED ORDER — VITAL HIGH PROTEIN PO LIQD
1000.0000 mL | ORAL | Status: DC
  Administered 2016-03-03 – 2016-03-09 (×6): 1000 mL

## 2016-03-03 NOTE — Progress Notes (Signed)
PULMONARY / CRITICAL CARE MEDICINE    Name: Kendra Osborne MRN: [id number] DOB: 06/02/1946    ADMISSION DATE:  02/24/2016  PRIMARY SERVICE: PCCM  CHIEF COMPLAINT:  Dyspnea  BRIEF PATIENT DESCRIPTION: 70 y/o woman recently moved from [name] who presents with respiratory distress. She presented to the ED on 3/17 with complaints of dyspnea, and lower extremity edema. History is limited due to language / culture barriers, but she has a PMHx of a known goiter that has been present for years, as well as possibly hypertension. It is not clear if the goiter was ever worked up or if she ever was on regular therapy for her hypertension. Her Nephew and son (who speaks little [place]) report that she been having a cough for a few days as well as some fevers and chills.  SUBJECTIVE:  No acute events overnight. Patient reintubated yesterday after failing with acute hypoxia.  REVIEW OF SYSTEMS:  Unable to obtain on ventilator and language barrier.  VITAL SIGNS: Temp:  [98.1 F (36.7 C)-99.5 F (37.5 C)] 98.9 F (37.2 C) (03/25 0341) Pulse Rate:  [54-102] 64 (03/25 0700) Resp:  [17-47] 24 (03/25 0700) BP: (86-174)/(46-114) 115/55 mmHg (03/25 0700) SpO2:  [91 %-100 %] 99 % (03/25 0700) FiO2 (%):  [40 %-50 %] 40 % (03/25 0600) Weight:  [103.8 kg (228 lb 13.4 oz)] 103.8 kg (228 lb 13.4 oz) (03/25 0421) HEMODYNAMICS:   VENTILATOR SETTINGS: Vent Mode:  [-] PRVC FiO2 (%):  [40 %-50 %] 40 % Set Rate:  [24 bmp] 24 bmp Vt Set:  [420 mL] 420 mL PEEP:  [5 cmH20] 5 cmH20 Plateau Pressure:  [18 cmH20-21 cmH20] 20 cmH20 INTAKE / OUTPUT: Intake/Output      03/24 0701 - 03/25 0700 03/25 0701 - 03/26 0700   I.V. (mL/kg) 435.4 (4.2)    NG/GT 90    IV Piggyback 500    Total Intake(mL/kg) 1025.4 (9.9)    Urine (mL/kg/hr) 5100 (2)    Drains     Stool 0 (0)    Total Output 5100     Net -4074.6          Stool Occurrence 1 x      PHYSICAL EXAMINATION: General:  Eyes open. No distress. No family at  bedside.  Integument:  Warm & dry. No rash on exposed skin. Surgical incision on neck clean & dry. HEENT:  No scleral injection. Endotracheal tube in place.  Cardiovascular:  Regular rate. No edema. No appreciable JVD.  Pulmonary:  Good aeration bilaterally. Symmetric chest wall rise on ventilator. Abdomen: Soft. Normal bowel sounds. Nondistended.  Neurological: Tracks to voice from both sides of the room. Patient spontaneously moving extremities. Appears grossly nonfocal.  LABS:  CBC  Recent Labs Lab 03/01/16 0400 03/02/16 0540 03/03/16 0400  WBC 10.8* 10.1 11.0*  HGB 10.4* 10.2* 10.2*  HCT 34.1* 33.4* 32.7*  PLT 320 263 251   Coag's No results for input(s): APTT, INR in the last 168 hours. BMET  Recent Labs Lab 03/02/16 0540 03/02/16 2020 03/03/16 0400  NA 140 143 146*  K 2.9* 3.3* 3.6  CL 104 103 107  CO2 27 29 30   BUN 43* 39* 33*  CREATININE 0.90 0.87 0.81  GLUCOSE 131* 106* 110*   Electrolytes  Recent Labs Lab 03/01/16 0400 03/02/16 0540 03/02/16 2020 03/03/16 0400  CALCIUM 7.3*  7.3* 7.1* 7.2* 7.2*  MG 1.9 1.7  --  1.5*  PHOS 3.2 3.0  --  2.5   Sepsis Markers No  results for input(s): LATICACIDVEN, PROCALCITON, O2SATVEN in the last 168 hours. ABG  Recent Labs Lab 03/02/16 1055  PHART 7.382  PCO2ART 48.2*  PO2ART 109*   Liver Enzymes  Recent Labs Lab 03/01/16 0400 03/02/16 0540 03/03/16 0400  ALBUMIN 2.0* 2.0* 2.0*   Cardiac Enzymes No results for input(s): TROPONINI, PROBNP in the last 168 hours. Glucose  Recent Labs Lab 03/02/16 0754 03/02/16 1233 03/02/16 1533 03/02/16 1951 03/02/16 2331 03/03/16 0333  GLUCAP 129* 119* 113* 106* 96 101*    Imaging Dg Chest Port 1 View  03/02/2016  CLINICAL DATA:  Endotracheal tube and NG tube placement EXAM: PORTABLE CHEST 1 VIEW COMPARISON:  02/29/2016 FINDINGS: endotracheal tube is seen with tip 18 mm above the carina. Left subclavian central line unchanged. NG tube crosses the  gastroesophageal junction. Bibasilar hypoventilatory change.  No pneumothorax. IMPRESSION: Lines and tubes as described above Electronically Signed   By: Esperanza Heir M.D.   On: 03/02/2016 14:42   SIGNIFICANT EVENTS: 3/17 - Admit 3/21 - Thyroidectomy 3/24 - Extubation & reintubation within 2 hours due to hypoxia & secretions  STUDIES:  CT-A of Chest >> mild mediastinal enlargement CT of the Neck with contrast >> multinodular goiter, meningioma Port CXR 3/22:  ETT & CVL in good position. Blunting bilateral costophrenic angles suggestive of effusions. Bilateral hilar fullness & interstitial prominence with low lung volumes.  Port CXR 3/25:. Silhouetting of the left hemidiaphragm. Platelike atelectasis right lower lung. Endotracheal tube in good position.  LINES / TUBES: OETT 7.5 3/17 - 3/24; 7.0 3/24>>> L Subclavian CVL 3/21>>> L Port 3/21>>> OGT 3/21 - 3/24; 3/24>>> Foley 3/18>>> PIV x1 L Radial Art Line 3/21 - 3/23 R & L Neck Drains 3/21 - ???  MICROBIOLOGY: Tracheal Asp Ctx 3/25>>> Blood Ctx x2 3/18:  Negative  MRSA PCR 3/18:  Negative   ANTIBIOTICS: Unasyn 3/24>>> Ancef 3/21 - 3/24 Clindamycin 3/21 (only 1 dose) Azithromycin 3/18 - 3/19 Rocephin 3/18 - 3/20  ASSESSMENT / PLAN:  PULMONARY A: Acute on Chronic Hypercarbic Respiratory Failure Acute Hypoxic Respiratory Failure - Question element of pulmonary edema vs viral bronchitis/pneumonitis. Extrinsic Airway Compression - Secondary to goiter. S/P total thyroidectomy 3/21.  P:   Full Vent Support SBT Daily Continuing Lasix IV q12hr for diuresis  CARDIOVASCULAR A: HTN - TTE 3/19 nml LV fn, gr 1 DD.  P:   Monitor on telemetry Vitals per unit protocol Hydralazine IV prn  RENAL A: Hypomagnesemia - Replaced. Hyponatremia - Resolved. Acute Renal Failure - Resolved. Hypokalemia - Resolved.  P:   Trending UOP with Foley while on lasix Monitor electrolytes & renal function daily Adjusting Magnesium  Sulfate replacement for only 3 gm Repeat Magnesium level at 2000  GASTROINTESTINAL A: No acute issues.  P:   NPO  Protonix IV daily Restart tube feedings  HEMATOLOGIC A: Anemia - Mild.  P:   Trending cell counts daily w/ CBC SCDs Start Heparin Owen q8hr  INFECTIOUS A: CAP vs Aspiration Pneumonia Surgical Prophylaxis  P:   Unasyn Day #2 Awaiting finalization of tracheal aspirate culture Monitor for fever & leukocytosis  ENDOCRINE A: S/P Total Thyroidectomy - Previously normal TSH.  P:   Monitor calcium level w/ daily labs Synthroid IV daily - will need lifelong supplementation  NEUROLOGIC A: Post-Op Pain  P:   RASS Goal: 0  Fentanyl IV prn Versed IV prn Propofol gtt  FAMILY UPDATE:  Son Naveed 3/24 by RA.  TODAY'S SUMMARY:  70 year old with large goiter and  questionable community acquired vs aspiration  pneumonia. Failed extubation yesterday. Continuing diuresis and Unasyn for now.  I have spent a total of 33 minutes of critical care time today caring for the patient and reviewing the patient's electronic medical record.  Donna Christen Jamison Neighbor, M.D. [facility] Pulmonary & Critical Care Pager:  [phone] After 3pm or if no response, call [phone] 03/03/2016, 7:40 AM

## 2016-03-03 NOTE — Progress Notes (Signed)
[facility] FOR AM LAB REPLACEMENT ONLY  The patient does apply for the [facility] Electrolyte Replacment Protocol based on the criteria listed below:   1. Is GFR >/= 40 ml/min? Yes.    Patient's GFR today is >60 2. Is urine output >/= 0.5 ml/kg/hr for the last 6 hours? Yes.   Patient's UOP is .0.62 ml/kg/hr 3. Is BUN < 60 mg/dL? Yes.    Patient's BUN today is 33 4. Abnormal electrolyte(s):  Mg - 1.5 5. Ordered repletion with: PER PROTOCOL 6. If a panic level lab has been reported, has the CCM MD in charge been notified? Yes.  .   Physician:  Dr. Ocie CornfieldSommer  Aahil Fredin C Kaimen Peine 03/03/2016 5:42 AM

## 2016-03-03 NOTE — Progress Notes (Signed)
PT Cancellation Note  Patient Details Name: Juliane Pootaseem Sublette MRN: [id number] DOB: 12/24/1945   Cancelled Treatment:    Reason Eval/Treat Not Completed: Patient not medically ready.  Pt intubated and not following commands.  PT will continue to follow acutely and will return to see pt once medically appropriate.  Encarnacion ChuAshley British Moyd PT, DPT  Pager: [phone] Phone: [phone] 03/03/2016, 9:35 AM

## 2016-03-03 NOTE — Progress Notes (Signed)
SLP Cancellation Note  Patient Details Name: Kendra Osborne MRN: [id number] DOB: 06/24/1946   Cancelled treatment:       Reason Eval/Treat Not Completed: Medical issues which prohibited therapy; pt continues on the ventilator; please re-consult swallow evaluation following extubation; ST to sign off   Marcene Duoshelsea Sumney MA, CCC-SLP Acute Care Speech Language Pathologist    Kennieth RadSumney, Ryden Wainer E 03/03/2016, 8:02 AM

## 2016-03-04 LAB — GLUCOSE, CAPILLARY
GLUCOSE-CAPILLARY: 107 mg/dL — AB (ref 65–99)
GLUCOSE-CAPILLARY: 107 mg/dL — AB (ref 65–99)
GLUCOSE-CAPILLARY: 102 mg/dL — AB (ref 65–99)
Glucose-Capillary: 102 mg/dL — ABNORMAL HIGH (ref 65–99)
Glucose-Capillary: 106 mg/dL — ABNORMAL HIGH (ref 65–99)
Glucose-Capillary: 126 mg/dL — ABNORMAL HIGH (ref 65–99)
Glucose-Capillary: 96 mg/dL (ref 65–99)

## 2016-03-04 LAB — CBC WITH DIFFERENTIAL/PLATELET
BASOS PCT: 0 %
Basophils Absolute: 0 10*3/uL (ref 0.0–0.1)
EOS ABS: 0.8 10*3/uL — AB (ref 0.0–0.7)
EOS PCT: 8 %
HCT: 32.7 % — ABNORMAL LOW (ref 36.0–46.0)
HEMOGLOBIN: 10.3 g/dL — AB (ref 12.0–15.0)
Lymphocytes Relative: 15 %
Lymphs Abs: 1.5 10*3/uL (ref 0.7–4.0)
MCH: 26.5 pg (ref 26.0–34.0)
MCHC: 31.5 g/dL (ref 30.0–36.0)
MCV: 84.1 fL (ref 78.0–100.0)
MONOS PCT: 9 %
Monocytes Absolute: 0.9 10*3/uL (ref 0.1–1.0)
NEUTROS PCT: 68 %
Neutro Abs: 6.9 10*3/uL (ref 1.7–7.7)
PLATELETS: 257 10*3/uL (ref 150–400)
RBC: 3.89 MIL/uL (ref 3.87–5.11)
RDW: 18.2 % — ABNORMAL HIGH (ref 11.5–15.5)
WBC: 10.1 10*3/uL (ref 4.0–10.5)

## 2016-03-04 LAB — BASIC METABOLIC PANEL
ANION GAP: 9 (ref 5–15)
BUN: 19 mg/dL (ref 6–20)
CHLORIDE: 103 mmol/L (ref 101–111)
CO2: 33 mmol/L — AB (ref 22–32)
Calcium: 7.7 mg/dL — ABNORMAL LOW (ref 8.9–10.3)
Creatinine, Ser: 0.76 mg/dL (ref 0.44–1.00)
GFR calc non Af Amer: >60 mL/min (ref >60)
GLUCOSE: 107 mg/dL — AB (ref 65–99)
POTASSIUM: 3.3 mmol/L — AB (ref 3.5–5.1)
Sodium: 145 mmol/L (ref 135–145)

## 2016-03-04 LAB — MAGNESIUM: MAGNESIUM: 1.9 mg/dL (ref 1.7–2.4)

## 2016-03-04 LAB — RENAL FUNCTION PANEL
Albumin: 2 g/dL — ABNORMAL LOW (ref 3.5–5.0)
Anion gap: 9 (ref 5–15)
BUN: 24 mg/dL — AB (ref 6–20)
CALCIUM: 7.6 mg/dL — AB (ref 8.9–10.3)
CHLORIDE: 104 mmol/L (ref 101–111)
CO2: 33 mmol/L — AB (ref 22–32)
CREATININE: 0.79 mg/dL (ref 0.44–1.00)
GFR calc non Af Amer: >60 mL/min (ref >60)
GLUCOSE: 133 mg/dL — AB (ref 65–99)
Phosphorus: 4.3 mg/dL (ref 2.5–4.6)
Potassium: 3 mmol/L — ABNORMAL LOW (ref 3.5–5.1)
SODIUM: 146 mmol/L — AB (ref 135–145)

## 2016-03-04 LAB — TRIGLYCERIDES: Triglycerides: 190 mg/dL — ABNORMAL HIGH (ref <150)

## 2016-03-04 MED ORDER — POTASSIUM CHLORIDE 20 MEQ/15ML (10%) PO SOLN
30.0000 meq | ORAL | Status: AC
  Administered 2016-03-04 (×2): 30 meq
  Filled 2016-03-04 (×2): qty 30

## 2016-03-04 MED ORDER — PANTOPRAZOLE SODIUM 40 MG PO PACK
40.0000 mg | PACK | Freq: Every day | ORAL | Status: DC
  Administered 2016-03-04 – 2016-03-16 (×12): 40 mg
  Filled 2016-03-04 (×13): qty 20

## 2016-03-04 MED ORDER — POTASSIUM CHLORIDE 20 MEQ/15ML (10%) PO SOLN
40.0000 meq | Freq: Once | ORAL | Status: AC
  Administered 2016-03-04: 40 meq
  Filled 2016-03-04: qty 30

## 2016-03-04 NOTE — Progress Notes (Signed)
PULMONARY / CRITICAL CARE MEDICINE    Name: Kendra Osborne MRN: [id number] DOB: 05/21/1946    ADMISSION DATE:  02/24/2016  PRIMARY SERVICE: PCCM  CHIEF COMPLAINT:  Dyspnea  BRIEF PATIENT DESCRIPTION: 70 y/o [name] who presents with respiratory distress. She presented to the ED on 3/17 with complaints of dyspnea, and lower extremity edema. History is limited due to language / culture barriers, but she has a PMHx of a known goiter that has been present for years, as well as possibly hypertension. It is not clear if the goiter was ever worked up or if she ever was on regular therapy for her hypertension. Her Nephew and son (who speaks little [place]) report that she been having a cough for a few days as well as some fevers and chills.  SUBJECTIVE:  No acute events overnight. Remains on ventilator. Tolerating diuresis.  REVIEW OF SYSTEMS:  Unable to obtain on ventilator and language barrier.  VITAL SIGNS: Temp:  [98 F (36.7 C)-98.5 F (36.9 C)] 98.3 F (36.8 C) (03/26 0432) Pulse Rate:  [57-80] 59 (03/26 0700) Resp:  [16-29] 18 (03/26 0700) BP: (112-161)/(56-85) 135/59 mmHg (03/26 0700) SpO2:  [97 %-100 %] 99 % (03/26 0700) FiO2 (%):  [40 %] 40 % (03/26 0306) Weight:  [102.5 kg (225 lb 15.5 oz)] 102.5 kg (225 lb 15.5 oz) (03/26 0433) HEMODYNAMICS:   VENTILATOR SETTINGS: Vent Mode:  [-] PRVC FiO2 (%):  [40 %] 40 % Set Rate:  [18 bmp-24 bmp] 18 bmp Vt Set:  [420 mL] 420 mL PEEP:  [5 cmH20] 5 cmH20 Plateau Pressure:  [19 cmH20-21 cmH20] 21 cmH20 INTAKE / OUTPUT: Intake/Output      03/25 0701 - 03/26 0700 03/26 0701 - 03/27 0700   I.V. (mL/kg) 573.3 (5.6)    NG/GT 105    IV Piggyback 400    Total Intake(mL/kg) 1078.3 (10.5)    Urine (mL/kg/hr) 2930 (1.2)    Stool     Total Output 2930     Net -1851.7            PHYSICAL EXAMINATION: General:  Eyes open. No family at bedside. No acute distress. Integument:  Warm & dry. No rash on exposed skin.  Surgical incision on neck clean & dry. HEENT:  No scleral injection. Endotracheal tube in place.  Cardiovascular:  Regular rate. No edema. No appreciable JVD.  Pulmonary:  Good aeration & clear to auscultation bilaterally. Symmetric chest wall rise on ventilator. Abdomen: Soft. Normal bowel sounds. Nondistended.  Neurological: Spontaneously moving all 4 extremities. Eyes open. Appears grossly nonfocal.  LABS:  CBC  Recent Labs Lab 03/02/16 0540 03/03/16 0400 03/04/16 0515  WBC 10.1 11.0* 10.1  HGB 10.2* 10.2* 10.3*  HCT 33.4* 32.7* 32.7*  PLT 263 251 257   Coag's No results for input(s): APTT, INR in the last 168 hours. BMET  Recent Labs Lab 03/02/16 2020 03/03/16 0400 03/04/16 0515  NA 143 146* 146*  K 3.3* 3.6 3.0*  CL 103 107 104  CO2 29 30 33*  BUN 39* 33* 24*  CREATININE 0.87 0.81 0.79  GLUCOSE 106* 110* 133*   Electrolytes  Recent Labs Lab 03/02/16 0540 03/02/16 2020 03/03/16 0400 03/03/16 2049 03/04/16 0515  CALCIUM 7.1* 7.2* 7.2*  --  7.6*  MG 1.7  --  1.5* 1.9 1.9  PHOS 3.0  --  2.5  --  4.3   Sepsis Markers No results for input(s): LATICACIDVEN, PROCALCITON, O2SATVEN in the last 168 hours. ABG  Recent Labs Lab 03/02/16 1055  PHART 7.382  PCO2ART 48.2*  PO2ART 109*   Liver Enzymes  Recent Labs Lab 03/02/16 0540 03/03/16 0400 03/04/16 0515  ALBUMIN 2.0* 2.0* 2.0*   Cardiac Enzymes No results for input(s): TROPONINI, PROBNP in the last 168 hours. Glucose  Recent Labs Lab 03/03/16 0754 03/03/16 1136 03/03/16 1619 03/03/16 2000 03/04/16 0048 03/04/16 0419  GLUCAP 109* 108* 93 94 106* 96    Imaging Portable Chest Xray  03/03/2016  CLINICAL DATA:  70 year old female with a history of acute respiratory failure EXAM: PORTABLE CHEST 1 VIEW COMPARISON:  03/02/2016, 02/29/2016, 02/28/2016, chest CT 02/24/2016 FINDINGS: Cardiomediastinal silhouette unchanged in size and contour. Atherosclerotic calcification of the aortic arch.  Unchanged left subclavian central venous catheter, with the tip appearing to terminate in the distal left brachycephalic vein. Unchanged gastric tube, terminating out of the field of view. Unchanged endotracheal tube, terminating 2.7 cm above the carina. Overlying EKG leads. Mixed interstitial and airspace opacities at the lung bases, similar to the comparison. IMPRESSION: Low lung volumes with likely basilar atelectasis. Support apparatus unchanged. Atherosclerosis. Signed, Yvone Neu. Loreta Ave, DO [facility] Radiology Electronically Signed   By: Gilmer Mor D.O.   On: 03/03/2016 08:43   Dg Chest Port 1 View  03/02/2016  CLINICAL DATA:  Endotracheal tube and NG tube placement EXAM: PORTABLE CHEST 1 VIEW COMPARISON:  02/29/2016 FINDINGS: endotracheal tube is seen with tip 18 mm above the carina. Left subclavian central line unchanged. NG tube crosses the gastroesophageal junction. Bibasilar hypoventilatory change.  No pneumothorax. IMPRESSION: Lines and tubes as described above Electronically Signed   By: Esperanza Heir M.D.   On: 03/02/2016 14:42   SIGNIFICANT EVENTS: 3/17 - Admit 3/21 - Thyroidectomy 3/24 - Extubation & reintubation within 2 hours due to hypoxia & secretions  STUDIES:  CT-A of Chest >> mild mediastinal enlargement CT of the Neck with contrast >> multinodular goiter, meningioma Port CXR 3/22:  ETT & CVL in good position. Blunting bilateral costophrenic angles suggestive of effusions. Bilateral hilar fullness & interstitial prominence with low lung volumes.  Port CXR 3/25:. Silhouetting of the left hemidiaphragm. Platelike atelectasis right lower lung. Endotracheal tube in good position.  LINES / TUBES: OETT 7.5 3/17 - 3/24; 7.0 3/24>>> L Subclavian CVL 3/21>>> L Port 3/21>>> OGT 3/21 - 3/24; 3/24>>> Foley 3/18>>> PIV x1 L Radial Art Line 3/21 - 3/23 R & L Neck Drains 3/21 - ???  MICROBIOLOGY: Tracheal Asp Ctx 3/25>>> Blood  Ctx x2 3/18:  Negative  MRSA PCR 3/18:  Negative   ANTIBIOTICS: Unasyn 3/24>>> Ancef 3/21 - 3/24 Clindamycin 3/21 (only 1 dose) Azithromycin 3/18 - 3/19 Rocephin 3/18 - 3/20  ASSESSMENT / PLAN:  PULMONARY A: Acute on Chronic Hypercarbic Respiratory Failure Acute Hypoxic Respiratory Failure - Question element of pulmonary edema vs viral bronchitis/pneumonitis. Extrinsic Airway Compression - Secondary to goiter. S/P total thyroidectomy 3/21.  P:   Full Vent Support SBT Daily Continuing Lasix IV q12hr for diuresis  CARDIOVASCULAR A: HTN - TTE 3/19 nml LV fn, gr 1 DD.  P:   Monitor on telemetry Vitals per unit protocol Hydralazine IV prn  RENAL A: Hypokalemia - Replacing. Hypernatremia - Mild but stable. Hypomagnesemia - Resolved. Hyponatremia - Resolved. Acute Renal Failure - Resolved.  P:   KCl VT today Trending UOP with Foley while on lasix Monitor electrolytes & renal function daily BMP @ 1700 today  GASTROINTESTINAL A: No acute issues.  P:   NPO  Protonix IV daily Continue tube feedings  HEMATOLOGIC A: Anemia - Mild.  P:   Trending cell counts daily w/ CBC SCDs Start Heparin [place] q8hr  INFECTIOUS A: CAP vs Aspiration Pneumonia Surgical Prophylaxis  P:   Unasyn Day #3 Awaiting finalization of tracheal aspirate culture Monitor for fever & leukocytosis  ENDOCRINE A: S/P Total Thyroidectomy - Previously normal TSH.  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  TODAY'S SUMMARY:  70 year old with large goiter and questionable community acquired vs aspiration  pneumonia. Failed extubation yesterday. Continuing diuresis and Unasyn for now. Plan for spontaneous breathing trial in the morning with probable extubation.  I have spent a total  of 31 minutes of critical care time today caring for the patient and reviewing the patient's electronic medical record.  Donna Christen Jamison Neighbor, M.D. [facility] Pulmonary & Critical Care Pager:  [phone] After 3pm or if no response, call [phone] 03/04/2016, 7:58 AM

## 2016-03-04 NOTE — Progress Notes (Signed)
eLink Physician-Brief Progress Note Patient Name: Kendra Osborne DOB: 08/24/1946 MRN: [id number]   Date of Service  03/04/2016  HPI/Events of Note  K 3.3  eICU Interventions  KCL 40 meq now per G tube     Intervention Category Major Interventions: Electrolyte abnormality - evaluation and management  Sandrea HughsMichael Burnie Hank 03/04/2016, 7:38 PM

## 2016-03-04 NOTE — Progress Notes (Signed)
[facility] FOR AM LAB REPLACEMENT ONLY  The patient does apply for the [facility] Electrolyte Replacment Protocol based on the criteria listed below:   1. Is GFR >/= 40 ml/min? Yes.    Patient's GFR today is >60 2. Is urine output >/= 0.5 ml/kg/hr for the last 6 hours? Yes.   Patient's UOP is 0.83 ml/kg/hr 3. Is BUN < 60 mg/dL? Yes.    Patient's BUN today is 24 4. Abnormal electrolyte(s):  K - 3.0 5. Ordered repletion with: PER PROTOCOL 6. If a panic level lab has been reported, has the CCM MD in charge been notified? Yes.  .   Physician:  Dr. Ocie CornfieldSommer  Bryse Blanchette C Monick Rena 03/04/2016 6:37 AM

## 2016-03-04 NOTE — Progress Notes (Signed)
PT Cancellation Note  Patient Details Name: Juliane Pootaseem Leppo MRN: [id number] DOB: 06/09/1946   Cancelled Treatment:    Reason Eval/Treat Not Completed: Patient not medically ready   No extubation noted;  Will follow up later today as time allows;  Otherwise, will follow up for PT tomorrow;   Thank you,  Van ClinesHolly Nahia Nissan, PT  Acute Rehabilitation Services Pager [phone] Office [phone]     Van ClinesGarrigan, Adir Schicker [facility] 03/04/2016, 7:21 AM

## 2016-03-05 DIAGNOSIS — J189 Pneumonia, unspecified organism: Secondary | ICD-10-CM

## 2016-03-05 LAB — CBC WITH DIFFERENTIAL/PLATELET
BASOS ABS: 0 10*3/uL (ref 0.0–0.1)
BASOS PCT: 0 %
EOS PCT: 7 %
Eosinophils Absolute: 0.7 10*3/uL (ref 0.0–0.7)
HEMATOCRIT: 33.2 % — AB (ref 36.0–46.0)
Hemoglobin: 9.7 g/dL — ABNORMAL LOW (ref 12.0–15.0)
Lymphocytes Relative: 20 %
Lymphs Abs: 1.8 10*3/uL (ref 0.7–4.0)
MCH: 24.7 pg — ABNORMAL LOW (ref 26.0–34.0)
MCHC: 29.2 g/dL — ABNORMAL LOW (ref 30.0–36.0)
MCV: 84.5 fL (ref 78.0–100.0)
MONO ABS: 0.8 10*3/uL (ref 0.1–1.0)
Monocytes Relative: 9 %
Neutro Abs: 5.7 10*3/uL (ref 1.7–7.7)
Neutrophils Relative %: 64 %
PLATELETS: 309 10*3/uL (ref 150–400)
RBC: 3.93 MIL/uL (ref 3.87–5.11)
RDW: 18.2 % — AB (ref 11.5–15.5)
WBC: 8.9 10*3/uL (ref 4.0–10.5)

## 2016-03-05 LAB — RENAL FUNCTION PANEL
ANION GAP: 12 (ref 5–15)
Albumin: 2 g/dL — ABNORMAL LOW (ref 3.5–5.0)
BUN: 19 mg/dL (ref 6–20)
CALCIUM: 7.7 mg/dL — AB (ref 8.9–10.3)
CHLORIDE: 104 mmol/L (ref 101–111)
CO2: 31 mmol/L (ref 22–32)
CREATININE: 0.77 mg/dL (ref 0.44–1.00)
Glucose, Bld: 116 mg/dL — ABNORMAL HIGH (ref 65–99)
Phosphorus: 3.6 mg/dL (ref 2.5–4.6)
Potassium: 3.5 mmol/L (ref 3.5–5.1)
Sodium: 147 mmol/L — ABNORMAL HIGH (ref 135–145)

## 2016-03-05 LAB — GLUCOSE, CAPILLARY
GLUCOSE-CAPILLARY: 97 mg/dL (ref 65–99)
GLUCOSE-CAPILLARY: 110 mg/dL — AB (ref 65–99)
GLUCOSE-CAPILLARY: 106 mg/dL — AB (ref 65–99)
Glucose-Capillary: 95 mg/dL (ref 65–99)
Glucose-Capillary: 118 mg/dL — ABNORMAL HIGH (ref 65–99)
Glucose-Capillary: 90 mg/dL (ref 65–99)

## 2016-03-05 LAB — TRIGLYCERIDES: TRIGLYCERIDES: 134 mg/dL (ref <150)

## 2016-03-05 LAB — MAGNESIUM: Magnesium: 1.7 mg/dL (ref 1.7–2.4)

## 2016-03-05 MED ORDER — POTASSIUM CHLORIDE 20 MEQ/15ML (10%) PO SOLN
40.0000 meq | Freq: Once | ORAL | Status: AC
  Administered 2016-03-05: 40 meq
  Filled 2016-03-05: qty 30

## 2016-03-05 MED ORDER — FREE WATER
200.0000 mL | Freq: Four times a day (QID) | Status: DC
  Administered 2016-03-05 – 2016-03-15 (×39): 200 mL

## 2016-03-05 NOTE — Progress Notes (Signed)
PULMONARY / CRITICAL CARE MEDICINE    Name: Kendra Osborne MRN: [id number] DOB: [birth year]    ADMISSION DATE:  02/24/2016  PRIMARY SERVICE: PCCM  CHIEF COMPLAINT:  Dyspnea  BRIEF PATIENT DESCRIPTION: 70 y/o woman recently moved from [place] who presents with respiratory distress. She presented to the ED on 3/17 with complaints of dyspnea, and lower extremity edema. History is limited due to language / culture barriers, but she has a PMHx of a known goiter that has been present for years, as well as possibly hypertension. It is not clear if the goiter was ever worked up or if she ever was on regular therapy for her hypertension. Her Nephew and son (who speaks little [place]) report that she been having a cough for a few days as well as some fevers and chills.  SUBJECTIVE:  afebrile. Remains on ventilator. diuresing well.  REVIEW OF SYSTEMS:  Unable to obtain on ventilator and language barrier.  VITAL SIGNS: Temp:  [98.1 F (36.7 C)-99.8 F (37.7 C)] 98.5 F (36.9 C) (03/27 0800) Pulse Rate:  [63-84] 82 (03/27 1145) Resp:  [14-41] 22 (03/27 1145) BP: (114-168)/(52-84) 123/56 mmHg (03/27 1145) SpO2:  [89 %-100 %] 93 % (03/27 1145) FiO2 (%):  [40 %] 40 % (03/27 1145) Weight:  [223 lb 15.8 oz (101.6 kg)] 223 lb 15.8 oz (101.6 kg) (03/27 0451) HEMODYNAMICS:   VENTILATOR SETTINGS: Vent Mode:  [-] PSV;CPAP FiO2 (%):  [40 %] 40 % Set Rate:  [18 bmp] 18 bmp Vt Set:  [420 mL] 420 mL PEEP:  [5 cmH20] 5 cmH20 Pressure Support:  [12 cmH20-15 cmH20] 12 cmH20 Plateau Pressure:  [18 cmH20-21 cmH20] 19 cmH20 INTAKE / OUTPUT: Intake/Output      03/26 0701 - 03/27 0700 03/27 0701 - 03/28 0700   I.V. (mL/kg) 599.6 (5.9) 58 (0.6)   NG/GT 360 30   IV Piggyback 400 100   Total Intake(mL/kg) 1359.6 (13.4) 188 (1.9)   Urine (mL/kg/hr) 3515 (1.4) 350 (0.7)   Total Output 3515 350   Net -2155.4 -162          PHYSICAL EXAMINATION: General:  Eyes open. No family at bedside. No acute  distress. Integument:  Warm & dry. No rash on exposed skin. Surgical incision on neck clean & dry. HEENT:  No scleral injection. Endotracheal tube in place.  Cardiovascular:  Regular rate. No edema. No appreciable JVD.  Pulmonary:  Good aeration & clear to auscultation bilaterally. Symmetric chest wall rise on ventilator. Abdomen: Soft. Normal bowel sounds. Nondistended.  Neurological: Spontaneously moving all 4 extremities. Eyes open. Appears grossly nonfocal.  LABS:  CBC  Recent Labs Lab 03/03/16 0400 03/04/16 0515 03/05/16 0450  WBC 11.0* 10.1 8.9  HGB 10.2* 10.3* 9.7*  HCT 32.7* 32.7* 33.2*  PLT 251 257 309   Coag's No results for input(s): APTT, INR in the last 168 hours. BMET  Recent Labs Lab 03/04/16 0515 03/04/16 1811 03/05/16 0450  NA 146* 145 147*  K 3.0* 3.3* 3.5  CL 104 103 104  CO2 33* 33* 31  BUN 24* 19 19  CREATININE 0.79 0.76 0.77  GLUCOSE 133* 107* 116*   Electrolytes  Recent Labs Lab 03/03/16 0400 03/03/16 2049 03/04/16 0515 03/04/16 1811 03/05/16 0450  CALCIUM 7.2*  --  7.6* 7.7* 7.7*  MG 1.5* 1.9 1.9  --  1.7  PHOS 2.5  --  4.3  --  3.6   Sepsis Markers No results for input(s): LATICACIDVEN, PROCALCITON, O2SATVEN in the last 168 hours. ABG  Recent Labs Lab 03/02/16 1055  PHART 7.382  PCO2ART 48.2*  PO2ART 109*   Liver Enzymes  Recent Labs Lab 03/03/16 0400 03/04/16 0515 03/05/16 0450  ALBUMIN 2.0* 2.0* 2.0*   Cardiac Enzymes No results for input(s): TROPONINI, PROBNP in the last 168 hours. Glucose  Recent Labs Lab 03/04/16 1140 03/04/16 1541 03/04/16 1953 03/04/16 2325 03/05/16 0401 03/05/16 0837  GLUCAP 107* 107* 102* 102* 97 95    Imaging No results found. SIGNIFICANT EVENTS: 3/17 - Admit 3/21 - Thyroidectomy 3/24 - Extubation & reintubation within 2 hours due to hypoxia & secretions  STUDIES:  CT-A of Chest >> mild mediastinal enlargement CT of the Neck with contrast >> multinodular goiter,  meningioma Port CXR 3/22:  ETT & CVL in good position. Blunting bilateral costophrenic angles suggestive of effusions. Bilateral hilar fullness & interstitial prominence with low lung volumes.  Port CXR 3/25:. Silhouetting of the left hemidiaphragm. Platelike atelectasis right lower lung. Endotracheal tube in good position.  LINES / TUBES: OETT 7.5 3/17 - 3/24; 7.0 3/24>>> L Subclavian CVL 3/21>>> L Port 3/21>>> OGT 3/21 - 3/24; 3/24>>> Foley 3/18>>> PIV x1 L Radial Art Line 3/21 - 3/23 R & L Neck Drains 3/21 - ???  MICROBIOLOGY: Tracheal Asp Ctx 3/25>>> Blood Ctx x2 3/18:  Negative  MRSA PCR 3/18:  Negative   ANTIBIOTICS: Unasyn 3/24>>> Ancef 3/21 - 3/24 Clindamycin 3/21 (only 1 dose) Azithromycin 3/18 - 3/19 Rocephin 3/18 - 3/20  ASSESSMENT / PLAN:  PULMONARY A: Acute on Chronic Hypercarbic Respiratory Failure Acute Hypoxic Respiratory Failure - Question element of pulmonary edema vs viral bronchitis/pneumonitis. Extrinsic Airway Compression - Secondary to goiter. S/P total thyroidectomy 3/21.  P:   SBT Daily - goal to extubate one more time Continuing Lasix IV q12hr for diuresis  CARDIOVASCULAR A: HTN - TTE 3/19 nml LV fn, gr 1 DD.  P:   Monitor on telemetry Vitals per unit protocol Hydralazine IV prn  RENAL A: Hypokalemia - Replacing. Hypernatremia - Mild but stable. Hypomagnesemia - Resolved. Hyponatremia - Resolved. Acute Renal Failure - Resolved.  P:   Trending UOP with Foley while on lasix Monitor electrolytes & renal function daily Replete lytes as needed Add free water   GASTROINTESTINAL A: No acute issues.  P:   NPO  Protonix IV daily Continue tube feedings  HEMATOLOGIC A: Anemia - Mild.  P:   Trending cell counts daily w/ CBC SCDs Start Heparin [place] q8hr  INFECTIOUS A: CAP vs Aspiration Pneumonia Surgical Prophylaxis  P:   Unasyn Day #4 Awaiting finalization of tracheal aspirate culture Monitor for fever &  leukocytosis  ENDOCRINE A: S/P Total Thyroidectomy - Previously normal TSH.  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  TODAY'S SUMMARY:  70 year old with large goiter and questionable community acquired vs aspiration  pneumonia. Failed extubation 3/24 . Continuing diuresis and Unasyn for now. Plan for spontaneous breathing trial in the morning with probable another trial of extubation soon.  The patient is critically ill with multiple organ systems failure and requires high complexity decision making for assessment and support, frequent evaluation and titration of therapies, application of advanced monitoring technologies and extensive interpretation of multiple databases. Critical Care Time devoted to patient care services described in this note independent  of APP time is 35 minutes.   Cyril Mourningakesh Emeterio Balke MD. Tonny BollmanFCCP. [place] Pulmonary & Critical care Pager [phone] If no response call [phone]   03/05/2016     03/05/2016, 12:17 PM

## 2016-03-05 NOTE — Progress Notes (Signed)
Inpatient Rehabilitation  We received an IP Rehab prescreen from PT.  Note pt. Is still intubated.  Will likely recommend IP Rehab consult once pt. Extubated and pending her progress. Please call if questions.  Weldon PickingSusan Atanacio Melnyk PT Inpatient Rehab Admissions Coordinator Cell [phone] Office [phone]

## 2016-03-05 NOTE — Progress Notes (Signed)
Subjective: POD#6 from total thyroidectomy. On ventilator but awakens easily and moves extremities purposefully. calciums WNL corrected and trending up.  Objective: Vital signs in last 24 hours: Temp:  [98.1 F (36.7 C)-99.8 F (37.7 C)] 98.1 F (36.7 C) (03/27 0400) Pulse Rate:  [63-76] 67 (03/27 0700) Resp:  [16-24] 18 (03/27 0700) BP: (114-168)/(52-78) 114/52 mmHg (03/27 0700) SpO2:  [94 %-100 %] 95 % (03/27 0700) FiO2 (%):  [40 %] 40 % (03/27 0309) Weight:  [101.6 kg (223 lb 15.8 oz)] 101.6 kg (223 lb 15.8 oz) (03/27 0451)  Incision clean dry and intact and healing well with absorbable sutures. Drains out. Neck supple and flat. Moves all extremities, tracks examiner and opens eyes. ETT and OGtube in place.  @LABLAST2 (wbc:2,hgb:2,hct:2,plt:2)  Recent Labs  03/04/16 1811 03/05/16 0450  NA 145 147*  K 3.3* 3.5  CL 103 104  CO2 33* 31  GLUCOSE 107* 116*  BUN 19 19  CREATININE 0.76 0.77  CALCIUM 7.7* 7.7*    Medications:  Scheduled Meds: . ampicillin-sulbactam (UNASYN) IV  3 g Intravenous Q6H  . antiseptic oral rinse  7 mL Mouth Rinse 10 times per day  . bacitracin   Topical TID  . chlorhexidine gluconate (SAGE KIT)  15 mL Mouth Rinse BID  . furosemide  40 mg Intravenous Q12H  . heparin subcutaneous  5,000 Units Subcutaneous 3 times per day  . levothyroxine  100 mcg Oral QAC breakfast  . pantoprazole sodium  40 mg Per Tube Daily   Continuous Infusions: . feeding supplement (VITAL HIGH PROTEIN) 1,000 mL (03/04/16 1535)  . fentaNYL infusion INTRAVENOUS 200 mcg/hr (03/05/16 0206)  . propofol (DIPRIVAN) infusion 15 mcg/kg/min (03/05/16 0244)   PRN Meds:.sodium chloride, fentaNYL (SUBLIMAZE) injection, fentaNYL (SUBLIMAZE) injection, hydrALAZINE, iohexol, iohexol, iohexol  Assessment/Plan: Stable POD#6 from total thyroidectomy. calciums normal (corrected) and trending up. Absorbable sutures in place. Still on vent.   LOS: 9 days   Ruby Cola 03/05/2016, 8:18  AM

## 2016-03-05 NOTE — Evaluation (Signed)
Physical Therapy Evaluation Patient Details Name: Kendra Osborne MRN: [id number] DOB: 12/30/1945 Today's Date: 03/05/2016   History of Present Illness  pt presents with Large Goiter and Respiratory Difficulties.  pt has underwent Total Thyroidectomy with goiter removal and has remained intubated with one failed extubation on 3/24.  pt recently came to the [place] from [place] and only known medical hx is HTN and Goiter.    Clinical Impression  Pt orally intubated and non-English speaking, but does acknowledge PT when her name is called and does an ecellent job following gestural directions.  Pt able to sit at EOB with MinG and independently attends to her sitting balance.  Feel as pt's respiratory status improves, she would benefit from CIR level of therapies at D/C to maximize independence.  Will continue to follow.      Follow Up Recommendations CIR    Equipment Recommendations  None recommended by PT    Recommendations for Other Services Rehab consult     Precautions / Restrictions Precautions Precautions: Fall Precaution Comments: Vent Restrictions Weight Bearing Restrictions: No      Mobility  Bed Mobility Overal bed mobility: Needs Assistance;+2 for physical assistance Bed Mobility: Supine to Sit;Sit to Supine     Supine to sit: Mod assist;+2 for physical assistance;HOB elevated Sit to supine: Max assist;+2 for physical assistance   General bed mobility comments: pt does follow gestural cueing and participates well with coming to sitting with HOB elevation.  Returning to bed, pt required increased A for trunk and Bil LEs.    Transfers                    Ambulation/Gait                Stairs            Wheelchair Mobility    Modified Rankin (Stroke Patients Only)       Balance Overall balance assessment: Needs assistance Sitting-balance support: Single extremity supported;No upper extremity supported;Feet unsupported Sitting balance-Leahy  Scale: Fair Sitting balance - Comments: pt needs occasional single UE support to maintain sitting balance.  pt does well attending to balance without cueing.                                       Pertinent Vitals/Pain Pain Assessment: Faces Faces Pain Scale: Hurts little more Pain Location: Unable to determine, but pt grimaces occasionally.  RN made aware.   Pain Descriptors / Indicators: Grimacing Pain Intervention(s): Monitored during session;Repositioned (On continuous Fentanyl)    Home Living Family/patient expects to be discharged to:: Unsure                 Additional Comments: No family present to discuss D/C plan and home set-up.    Prior Function           Comments: Unsure as no family present and pt unable to communicate.     Hand Dominance        Extremity/Trunk Assessment   Upper Extremity Assessment: Defer to OT evaluation           Lower Extremity Assessment: Generalized weakness      Cervical / Trunk Assessment: Normal  Communication   Communication: Prefers language other than English (Orally intubated and non-English speaking.)  Cognition Arousal/Alertness: Awake/alert Behavior During Therapy: Flat affect Overall Cognitive Status: Difficult to assess  General Comments      Exercises        Assessment/Plan    PT Assessment Patient needs continued PT services  PT Diagnosis Difficulty walking;Generalized weakness   PT Problem List Decreased strength;Decreased activity tolerance;Decreased balance;Decreased mobility;Decreased coordination;Decreased knowledge of use of DME;Cardiopulmonary status limiting activity;Obesity;Pain  PT Treatment Interventions DME instruction;Gait training;Functional mobility training;Therapeutic activities;Therapeutic exercise;Balance training;Stair training;Neuromuscular re-education;Patient/family education   PT Goals (Current goals can be found in the Care Plan  section) Acute Rehab PT Goals Patient Stated Goal: Unable to state. PT Goal Formulation: Patient unable to participate in goal setting Time For Goal Achievement: 03/19/16 Potential to Achieve Goals: Good    Frequency Min 3X/week   Barriers to discharge Other (comment) Unclear level of support at home or home environment.    Co-evaluation               End of Session Equipment Utilized During Treatment:  (Vent) Activity Tolerance: Patient tolerated treatment well Patient left: in bed;with call bell/phone within reach (Mitts re-applied) Nurse Communication: Mobility status         Time: [id number] PT Time Calculation (min) (ACUTE ONLY): 29 min   Charges:   PT Evaluation $PT Eval Moderate Complexity: 1 Procedure PT Treatments $Therapeutic Activity: 8-22 mins   PT G CodesSunny Schlein:        Klea Nall F, [place] CarolinaPT [phone] 03/05/2016, 10:36 AM

## 2016-03-06 ENCOUNTER — Other Ambulatory Visit: Payer: Self-pay | Admitting: Otolaryngology

## 2016-03-06 ENCOUNTER — Inpatient Hospital Stay (HOSPITAL_COMMUNITY): Payer: Medicaid Other

## 2016-03-06 LAB — CBC WITH DIFFERENTIAL/PLATELET
BASOS ABS: 0 10*3/uL (ref 0.0–0.1)
BASOS PCT: 0 %
EOS PCT: 5 %
Eosinophils Absolute: 0.5 10*3/uL (ref 0.0–0.7)
HCT: 32.7 % — ABNORMAL LOW (ref 36.0–46.0)
Hemoglobin: 9.4 g/dL — ABNORMAL LOW (ref 12.0–15.0)
LYMPHS PCT: 21 %
Lymphs Abs: 2 10*3/uL (ref 0.7–4.0)
MCH: 24.7 pg — ABNORMAL LOW (ref 26.0–34.0)
MCHC: 28.7 g/dL — ABNORMAL LOW (ref 30.0–36.0)
MCV: 86.1 fL (ref 78.0–100.0)
MONO ABS: 0.9 10*3/uL (ref 0.1–1.0)
Monocytes Relative: 9 %
NEUTROS ABS: 6.5 10*3/uL (ref 1.7–7.7)
Neutrophils Relative %: 65 %
Platelets: 327 10*3/uL (ref 150–400)
RBC: 3.8 MIL/uL — AB (ref 3.87–5.11)
RDW: 18.4 % — ABNORMAL HIGH (ref 11.5–15.5)
WBC: 9.9 10*3/uL (ref 4.0–10.5)

## 2016-03-06 LAB — RENAL FUNCTION PANEL
Albumin: 2 g/dL — ABNORMAL LOW (ref 3.5–5.0)
Anion gap: 9 (ref 5–15)
BUN: 19 mg/dL (ref 6–20)
CHLORIDE: 104 mmol/L (ref 101–111)
CO2: 32 mmol/L (ref 22–32)
CREATININE: 0.84 mg/dL (ref 0.44–1.00)
Calcium: 8 mg/dL — ABNORMAL LOW (ref 8.9–10.3)
Glucose, Bld: 96 mg/dL (ref 65–99)
POTASSIUM: 3.3 mmol/L — AB (ref 3.5–5.1)
Phosphorus: 3 mg/dL (ref 2.5–4.6)
Sodium: 145 mmol/L (ref 135–145)

## 2016-03-06 LAB — GLUCOSE, CAPILLARY
GLUCOSE-CAPILLARY: 98 mg/dL (ref 65–99)
GLUCOSE-CAPILLARY: 111 mg/dL — AB (ref 65–99)
GLUCOSE-CAPILLARY: 95 mg/dL (ref 65–99)
GLUCOSE-CAPILLARY: 93 mg/dL (ref 65–99)
Glucose-Capillary: 101 mg/dL — ABNORMAL HIGH (ref 65–99)
Glucose-Capillary: 79 mg/dL (ref 65–99)

## 2016-03-06 LAB — CULTURE, RESPIRATORY W GRAM STAIN: Gram Stain: NONE SEEN

## 2016-03-06 LAB — CULTURE, RESPIRATORY: CULTURE: NO GROWTH

## 2016-03-06 LAB — MAGNESIUM: MAGNESIUM: 1.7 mg/dL (ref 1.7–2.4)

## 2016-03-06 LAB — TRIGLYCERIDES: TRIGLYCERIDES: 140 mg/dL (ref <150)

## 2016-03-06 MED ORDER — MAGNESIUM SULFATE 2 GM/50ML IV SOLN
2.0000 g | Freq: Once | INTRAVENOUS | Status: AC
  Administered 2016-03-06: 2 g via INTRAVENOUS
  Filled 2016-03-06: qty 50

## 2016-03-06 MED ORDER — POTASSIUM CHLORIDE 20 MEQ/15ML (10%) PO SOLN
20.0000 meq | ORAL | Status: AC
  Administered 2016-03-06 (×2): 20 meq
  Filled 2016-03-06 (×2): qty 15

## 2016-03-06 NOTE — Evaluation (Signed)
Occupational Therapy Evaluation Patient Details Name: Kendra Osborne MRN: [id number] DOB: 05/15/1946 Today's Date: 03/06/2016    History of Present Illness pt presents with Large thyroid mass/goiter and Respiratory Difficulties.  pt has underwent Total Thyroidectomy with goiter removal and has remained intubated with one failed extubation on 3/24.  pt [place] the [name] and only known medical hx is HTN and Goiter.     Clinical Impression   Pt admitted with above. She demonstrates the below listed deficits and will benefit from continued OT to maximize safety and independence with BADLs.  Pt presents to OT with generalized weakness, decreased activity tolerance.  She is currently intubated.  She was able to sit EOB x ~10 mins with min guard assist - VSS.   Agree with recommendations for CIR.  Will follow.       Follow Up Recommendations  CIR;Supervision/Assistance - 24 hour    Equipment Recommendations  3 in 1 bedside comode    Recommendations for Other Services       Precautions / Restrictions Precautions Precautions: Fall Precaution Comments: Vent Restrictions Weight Bearing Restrictions: No      Mobility Bed Mobility Overal bed mobility: Needs Assistance;+2 for physical assistance Bed Mobility: Supine to Sit;Sit to Supine     Supine to sit: Mod assist;+2 for physical assistance Sit to supine: Max assist;+2 for physical assistance   General bed mobility comments: Pt follows gestural cues and assists with lifting trunk up and moving LEs to EOB.   Transfers                 General transfer comment: unable to safely attempt at this time.     Balance Overall balance assessment: Needs assistance Sitting-balance support: Feet supported;Single extremity supported Sitting balance-Leahy Scale: Fair Sitting balance - Comments: Once moved to EOB, pt was able to maintain EOB sitting with min guard assist        Standing balance comment: unable tto  attempt as pt fatigued                             ADL Overall ADL's : Needs assistance/impaired Eating/Feeding: NPO   Grooming: Wash/dry face;Minimal assistance;Sitting   Upper Body Bathing: Maximal assistance;Sitting   Lower Body Bathing: Total assistance;Bed level   Upper Body Dressing : Total assistance;Sitting   Lower Body Dressing: Total assistance;Bed level   Toilet Transfer: Total assistance Toilet Transfer Details (indicate cue type and reason): unable to safely assess  Toileting- Clothing Manipulation and Hygiene: Total assistance;Bed level       Functional mobility during ADLs: Moderate assistance;+2 for physical assistance (bed mobility) General ADL Comments: Pt fatigues while EOB      Vision     Perception     Praxis      Pertinent Vitals/Pain Pain Assessment: Faces Faces Pain Scale: Hurts little more Pain Location: unable to determine  Pain Descriptors / Indicators: Grimacing Pain Intervention(s): Monitored during session;Repositioned     Hand Dominance  (unsure )   Extremity/Trunk Assessment Upper Extremity Assessment Upper Extremity Assessment: Generalized weakness   Lower Extremity Assessment Lower Extremity Assessment: Defer to PT evaluation   Cervical / Trunk Assessment Cervical / Trunk Assessment: Normal   Communication Communication Communication: Prefers language other than [place];Other (comment) (pt with ETT tube and is non [place] speaking )   Cognition Arousal/Alertness: Awake/alert Behavior During Therapy: Flat affect;Restless;Anxious Overall Cognitive Status: Difficult to assess  General Comments       Exercises       Shoulder Instructions      Home Living Family/patient expects to be discharged to:: Unsure                                 Additional Comments: No family present to discuss D/C plan and home set-up.      Prior Functioning/Environment           Comments: Unsure as no family present and pt unable to communicate.    OT Diagnosis: Generalized weakness   OT Problem List: Decreased strength;Decreased activity tolerance;Impaired balance (sitting and/or standing);Decreased safety awareness;Decreased knowledge of use of DME or AE;Decreased knowledge of precautions;Cardiopulmonary status limiting activity;Obesity   OT Treatment/Interventions: Self-care/ADL training;Therapeutic exercise;Energy conservation;DME and/or AE instruction;Therapeutic activities;Patient/family education;Balance training    OT Goals(Current goals can be found in the care plan section) Acute Rehab OT Goals OT Goal Formulation: Patient unable to participate in goal setting Time For Goal Achievement: 03/20/16 Potential to Achieve Goals: Good ADL Goals Pt Will Perform Grooming: with min assist;standing Pt Will Perform Upper Body Bathing: with supervision;sitting Pt Will Perform Lower Body Bathing: with min assist;sit to/from stand Pt Will Perform Upper Body Dressing: with min assist;sitting Pt Will Perform Lower Body Dressing: with min assist;sit to/from stand Pt Will Transfer to Toilet: with min assist;ambulating;regular height toilet;bedside commode;grab bars Pt Will Perform Toileting - Clothing Manipulation and hygiene: with min assist;sit to/from stand Pt/caregiver will Perform Home Exercise Program: Increased strength;Both right and left upper extremity;With theraband;With Supervision;With written HEP provided  OT Frequency: Min 2X/week   Barriers to D/C:            Co-evaluation              End of Session Equipment Utilized During Treatment: Oxygen (intubated ) Nurse Communication: Mobility status  Activity Tolerance: Patient limited by fatigue Patient left: in bed;with call bell/phone within reach   Time: 1156-1220 OT Time Calculation (min): 24 min Charges:  OT General Charges $OT Visit: 1 Procedure OT Evaluation $OT Eval Moderate  Complexity: 1 Procedure OT Treatments $Therapeutic Activity: 8-22 mins G-Codes:    Taariq Leitz M March 27, 2016, 12:39 PM

## 2016-03-06 NOTE — Progress Notes (Addendum)
PULMONARY / CRITICAL CARE MEDICINE    Name: Kendra Osborne MRN: [id number] DOB: 05/05/1946    ADMISSION DATE:  02/24/2016  PRIMARY SERVICE: PCCM  CHIEF COMPLAINT:  Dyspnea  BRIEF PATIENT DESCRIPTION: 70 y/o woman recently moved from [name] who presents with respiratory distress. She presented to the ED on 3/17 with complaints of dyspnea, and lower extremity edema. History is limited due to language / culture barriers, but she has a PMHx of a known goiter that has been present for years, as well as possibly hypertension. It is not clear if the goiter was ever worked up or if she ever was on regular therapy for her hypertension. Her Nephew and son (who speaks little [place]) report that she been having a cough for a few days as well as some fevers and chills.  SUBJECTIVE:   Remains critically ill on ventilator.  Afebrile. No obvious pain  REVIEW OF SYSTEMS:  Unable to obtain on ventilator and language barrier.  VITAL SIGNS: Temp:  [97.5 F (36.4 C)-100.4 F (38 C)] 97.9 F (36.6 C) (03/28 0800) Pulse Rate:  [55-89] 69 (03/28 0800) Resp:  [15-59] 19 (03/28 0800) BP: (109-192)/(51-89) 126/59 mmHg (03/28 0800) SpO2:  [87 %-100 %] 98 % (03/28 0800) FiO2 (%):  [30 %-40 %] 30 % (03/28 0800) Weight:  [216 lb 14.9 oz (98.4 kg)] 216 lb 14.9 oz (98.4 kg) (03/28 0403) HEMODYNAMICS:   VENTILATOR SETTINGS: Vent Mode:  [-] PSV;CPAP FiO2 (%):  [30 %-40 %] 30 % Set Rate:  [18 bmp] 18 bmp Vt Set:  [420 mL] 420 mL PEEP:  [5 cmH20] 5 cmH20 Pressure Support:  [12 cmH20-15 cmH20] 15 cmH20 Plateau Pressure:  [19 cmH20-21 cmH20] 21 cmH20 INTAKE / OUTPUT: Intake/Output      03/27 0701 - 03/28 0700 03/28 0701 - 03/29 0700   I.V. (mL/kg) 1290.6 (13.1) 24.2 (0.2)   NG/GT 860 55   IV Piggyback 200 100   Total Intake(mL/kg) 2350.6 (23.9) 179.2 (1.8)   Urine (mL/kg/hr) 2720 (1.2) 175 (0.6)   Total Output 2720 175   Net -369.4 +4.2          PHYSICAL EXAMINATION: General: acutely ill,  Eyes open.  No family at bedside. No acute distress. Integument:  Warm & dry. No rash on exposed skin. Surgical incision on neck clean & dry. HEENT:  No scleral injection. Endotracheal tube in place.  Cardiovascular:  Regular rate. No edema. No appreciable JVD.  Pulmonary:  Good aeration & clear to auscultation bilaterally. Symmetric chest wall rise on ventilator. Abdomen: Soft. Normal bowel sounds. Nondistended.  Neurological: Spontaneously moving all 4 extremities. Eyes open. Appears grossly nonfocal.  LABS:  CBC  Recent Labs Lab 03/04/16 0515 03/05/16 0450 03/06/16 0400  WBC 10.1 8.9 9.9  HGB 10.3* 9.7* 9.4*  HCT 32.7* 33.2* 32.7*  PLT 257 309 327   Coag's No results for input(s): APTT, INR in the last 168 hours. BMET  Recent Labs Lab 03/04/16 1811 03/05/16 0450 03/06/16 0400  NA 145 147* 145  K 3.3* 3.5 3.3*  CL 103 104 104  CO2 33* 31 32  BUN 19 19 19   CREATININE 0.76 0.77 0.84  GLUCOSE 107* 116* 96   Electrolytes  Recent Labs Lab 03/04/16 0515 03/04/16 1811 03/05/16 0450 03/06/16 0400  CALCIUM 7.6* 7.7* 7.7* 8.0*  MG 1.9  --  1.7 1.7  PHOS 4.3  --  3.6 3.0   Sepsis Markers No results for input(s): LATICACIDVEN, PROCALCITON, O2SATVEN in the last 168 hours. ABG  Recent Labs Lab 03/02/16 1055  PHART 7.382  PCO2ART 48.2*  PO2ART 109*   Liver Enzymes  Recent Labs Lab 03/04/16 0515 03/05/16 0450 03/06/16 0400  ALBUMIN 2.0* 2.0* 2.0*   Cardiac Enzymes No results for input(s): TROPONINI, PROBNP in the last 168 hours. Glucose  Recent Labs Lab 03/05/16 1223 03/05/16 1539 03/05/16 2010 03/05/16 2308 03/06/16 0309 03/06/16 0739  GLUCAP 118* 110* 106* 90 98 101*    Imaging Dg Chest Port 1 View  03/06/2016  CLINICAL DATA:  Respiratory failure EXAM: PORTABLE CHEST 1 VIEW COMPARISON:  03/03/2016 FINDINGS: Cardiac shadow is stable. The left subclavian central line, endotracheal tube and nasogastric catheter are again seen and stable. Bibasilar  atelectatic changes are again seen and stable. No acute abnormality is noted. IMPRESSION: No change from the prior exam. Electronically Signed   By: Alcide Clever M.D.   On: 03/06/2016 07:37   SIGNIFICANT EVENTS: 3/17 - Admit 3/21 - Thyroidectomy 3/24 - Extubation & reintubation within 2 hours due to hypoxia & secretions  STUDIES:  CT-A of Chest >> mild mediastinal enlargement CT of the Neck with contrast >> multinodular goiter, meningioma Port CXR 3/22:  ETT & CVL in good position. Blunting bilateral costophrenic angles suggestive of effusions. Bilateral hilar fullness & interstitial prominence with low lung volumes.  Port CXR 3/25:. Silhouetting of the left hemidiaphragm. Platelike atelectasis right lower lung. Endotracheal tube in good position.  LINES / TUBES: OETT 7.5 3/17 - 3/24; 7.0 3/24>>> L Subclavian CVL 3/21>>> L Port 3/21>>> OGT 3/21 - 3/24; 3/24>>> Foley 3/18>>> PIV x1 L Radial Art Line 3/21 - 3/23 R & L Neck Drains 3/21 - ???  MICROBIOLOGY: Tracheal Asp Ctx 3/25>>> ng Blood Ctx x2 3/18:  Negative  MRSA PCR 3/18:  Negative   ANTIBIOTICS: Unasyn 3/24>>> Ancef 3/21 - 3/24 Clindamycin 3/21 (only 1 dose) Azithromycin 3/18 - 3/19 Rocephin 3/18 - 3/20  ASSESSMENT / PLAN:  PULMONARY A: Acute on Chronic Hypercarbic Respiratory Failure Acute Hypoxic Respiratory Failure - Question element of pulmonary edema vs viral bronchitis/pneumonitis. Extrinsic Airway Compression - Secondary to goiter. S/P total thyroidectomy 3/21.  P:   SBT Daily - goal to extubate one more time, if fails again will certainly need trach   CARDIOVASCULAR A: HTN - TTE 3/19 nml LV fn, gr 1 DD.  P:   Hydralazine IV prn Continuing Lasix IV q12hr for diuresis  RENAL A: Hypokalemia - Replacing. Hypernatremia - Mild but stable. Hypomagnesemia - Resolved. Hyponatremia - Resolved. Acute Renal Failure - Resolved.  P:   Trending UOP with Foley while on lasix Monitor electrolytes & renal  function daily Replete lytes as needed Ct free water   GASTROINTESTINAL A: No acute issues.  P:   NPO  Protonix  daily Continue tube feedings  HEMATOLOGIC A: Anemia - Mild.  P:   Trending cell counts daily w/ CBC SCDs Start Heparin [place] q8hr  INFECTIOUS A: CAP vs Aspiration Pneumonia  P:   Unasyn Day #5/7 Monitor for fever & leukocytosis  ENDOCRINE A: S/P Total Thyroidectomy - Previously normal TSH.  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  TODAY'S SUMMARY:  71 year old with large goiter and questionable community acquired vs aspiration  pneumonia. Failed extubation 3/24 . Continuing diuresis and Unasyn  for now. Plan for  another trial of extubation to bipap - if fails again will certainly need trach  The patient is critically ill with multiple organ systems failure and requires high complexity decision making for assessment and support, frequent evaluation and titration of therapies, application of advanced monitoring technologies and extensive interpretation of multiple databases. Critical Care Time devoted to patient care services described in this note independent of APP time is 35 minutes.   Cyril Mourning MD. Tonny Bollman. [facility] Pager [phone] If no response call [phone]   03/06/2016   Addendum- she failed weaning due to desaturations, discussed with her son-we'll proceed with tracheostomy. I have called Dr. Emeline Darling to schedule, hopefully we should be able to liberate her from the vent soon after  Oretha Milch. MD

## 2016-03-06 NOTE — Progress Notes (Addendum)
Subjective: POD#7 from total thyroidectomy. Still on vent but awakens easily  Objective: Vital signs in last 24 hours: Temp:  [97.5 F (36.4 C)-100.4 F (38 C)] 98.5 F (36.9 C) (03/28 0400) Pulse Rate:  [55-89] 69 (03/28 0700) Resp:  [14-59] 21 (03/28 0700) BP: (109-192)/(51-89) 138/58 mmHg (03/28 0700) SpO2:  [87 %-100 %] 100 % (03/28 0700) FiO2 (%):  [30 %-40 %] 30 % (03/28 0600) Weight:  [98.4 kg (216 lb 14.9 oz)] 98.4 kg (216 lb 14.9 oz) (03/28 0403)   Incision clean dry and intact with absorbable sutures, neck supple and flat. ET tube in place in oral cavity.  _0 (wbc:2,hgb:2,hct:2,plt:2)  Recent Labs  03/05/16 0450 03/06/16 0400  NA 147* 145  K 3.5 3.3*  CL 104 104  CO2 31 32  GLUCOSE 116* 96  BUN 19 19  CREATININE 0.77 0.84  CALCIUM 7.7* 8.0*    Medications:  Scheduled Meds: . ampicillin-sulbactam (UNASYN) IV  3 g Intravenous Q6H  . antiseptic oral rinse  7 mL Mouth Rinse 10 times per day  . bacitracin   Topical TID  . chlorhexidine gluconate (SAGE KIT)  15 mL Mouth Rinse BID  . free water  200 mL Per Tube 4 times per day  . furosemide  40 mg Intravenous Q12H  . heparin subcutaneous  5,000 Units Subcutaneous 3 times per day  . levothyroxine  100 mcg Oral QAC breakfast  . pantoprazole sodium  40 mg Per Tube Daily  . potassium chloride  20 mEq Per Tube Q4H   Continuous Infusions: . feeding supplement (VITAL HIGH PROTEIN) 1,000 mL (03/06/16 0700)  . fentaNYL infusion INTRAVENOUS 125 mcg/hr (03/06/16 0700)  . propofol (DIPRIVAN) infusion 14.969 mcg/kg/min (03/06/16 0700)   PRN Meds:.sodium chloride, fentaNYL (SUBLIMAZE) injection, fentaNYL (SUBLIMAZE) injection, hydrALAZINE, iohexol, iohexol, iohexol  Assessment/Plan: Stable s/p total thyroidectomy for massive benign goiter. Still on vent. calciums trending upward and normal (corrected). Will monitor   LOS: 10 days   Ruby Cola 03/06/2016, 8:18 AM   Critical care requested tracheotomy.  Tracheotomy and risks, benefits, and alternatives were previously discussed with patient's family. Will look at the operating room schedule at schedule her for tracheotomy in the next few days.  Ruby Cola, MD  12:57 PM  03/06/2016

## 2016-03-06 NOTE — Progress Notes (Signed)
[facility] FOR [facility] LAB REPLACEMENT ONLY  The patient does apply for the [facility] Electrolyte [facility]plac[facility] Protocol based on the criteria listed below:   1. Is GFR >/= 40 ml/min? Yes.    Patient's GFR today is >60 2. Is urine output >/= 0.5 ml/kg/hr for the last 6 hours? Yes.   Patient's UOP is 0.59 ml/kg/hr 3. Is BUN < 60 mg/dL? Yes.    Patient's BUN today is 19 4. Abnormal electrolyte K 3.3, Mg 1.7 5. Ordered repletion with: per protocol 6. If a panic level lab has been reported, has the CCM MD in charge been notified? Yes.  .   Physician:  Lawerance SabalYacoub  Shaneece Stockburger [facility] 03/06/2016 5:39 AM

## 2016-03-06 NOTE — Progress Notes (Signed)
Consent obtained for tracheostomy from patient's son, Maree Krabbeaveed, after he talked to Dr. Vassie LollAlva.

## 2016-03-06 NOTE — Progress Notes (Signed)
Patient's son aware plans tracheostomy on Friday 3/31. He now has more questions regarding tracheostomy. He stated he will try to meet with Dr. Emeline DarlingGore and may even get a family member who is an MD to speak with the surgeon.

## 2016-03-07 DIAGNOSIS — J810 Acute pulmonary edema: Secondary | ICD-10-CM | POA: Diagnosis present

## 2016-03-07 DIAGNOSIS — G9340 Encephalopathy, unspecified: Secondary | ICD-10-CM | POA: Diagnosis present

## 2016-03-07 DIAGNOSIS — J9601 Acute respiratory failure with hypoxia: Secondary | ICD-10-CM | POA: Diagnosis present

## 2016-03-07 LAB — CBC WITH DIFFERENTIAL/PLATELET
BASOS ABS: 0 10*3/uL (ref 0.0–0.1)
BASOS PCT: 0 %
EOS ABS: 0.6 10*3/uL (ref 0.0–0.7)
EOS PCT: 6 %
HCT: 33.4 % — ABNORMAL LOW (ref 36.0–46.0)
HEMOGLOBIN: 9.8 g/dL — AB (ref 12.0–15.0)
Lymphocytes Relative: 21 %
Lymphs Abs: 2.1 10*3/uL (ref 0.7–4.0)
MCH: 24.9 pg — AB (ref 26.0–34.0)
MCHC: 29.3 g/dL — ABNORMAL LOW (ref 30.0–36.0)
MCV: 85 fL (ref 78.0–100.0)
Monocytes Absolute: 0.8 10*3/uL (ref 0.1–1.0)
Monocytes Relative: 8 %
NEUTROS PCT: 65 %
Neutro Abs: 6.3 10*3/uL (ref 1.7–7.7)
PLATELETS: 373 10*3/uL (ref 150–400)
RBC: 3.93 MIL/uL (ref 3.87–5.11)
RDW: 18.2 % — AB (ref 11.5–15.5)
WBC: 9.7 10*3/uL (ref 4.0–10.5)

## 2016-03-07 LAB — RENAL FUNCTION PANEL
ALBUMIN: 2 g/dL — AB (ref 3.5–5.0)
Anion gap: 10 (ref 5–15)
BUN: 19 mg/dL (ref 6–20)
CALCIUM: 8.3 mg/dL — AB (ref 8.9–10.3)
CHLORIDE: 103 mmol/L (ref 101–111)
CO2: 31 mmol/L (ref 22–32)
CREATININE: 1.03 mg/dL — AB (ref 0.44–1.00)
GFR, EST NON AFRICAN AMERICAN: 55 mL/min — AB (ref >60)
Glucose, Bld: 105 mg/dL — ABNORMAL HIGH (ref 65–99)
PHOSPHORUS: 3.8 mg/dL (ref 2.5–4.6)
Potassium: 3.4 mmol/L — ABNORMAL LOW (ref 3.5–5.1)
Sodium: 144 mmol/L (ref 135–145)

## 2016-03-07 LAB — GLUCOSE, CAPILLARY
GLUCOSE-CAPILLARY: 83 mg/dL (ref 65–99)
GLUCOSE-CAPILLARY: 102 mg/dL — AB (ref 65–99)
GLUCOSE-CAPILLARY: 99 mg/dL (ref 65–99)
Glucose-Capillary: 84 mg/dL (ref 65–99)
Glucose-Capillary: 104 mg/dL — ABNORMAL HIGH (ref 65–99)
Glucose-Capillary: 97 mg/dL (ref 65–99)

## 2016-03-07 LAB — TRIGLYCERIDES
TRIGLYCERIDES: 178 mg/dL — AB (ref <150)
Triglycerides: 185 mg/dL — ABNORMAL HIGH (ref <150)

## 2016-03-07 LAB — MAGNESIUM: MAGNESIUM: 2.1 mg/dL (ref 1.7–2.4)

## 2016-03-07 MED ORDER — DEXMEDETOMIDINE HCL IN NACL 200 MCG/50ML IV SOLN
0.4000 ug/kg/h | INTRAVENOUS | Status: DC
Start: 2016-03-07 — End: 2016-03-09
  Administered 2016-03-07: 0.4 ug/kg/h via INTRAVENOUS
  Administered 2016-03-07: 0.2 ug/kg/h via INTRAVENOUS
  Administered 2016-03-08: 0.4 ug/kg/h via INTRAVENOUS
  Administered 2016-03-08 (×4): 0.5 ug/kg/h via INTRAVENOUS
  Administered 2016-03-09: 0.4 ug/kg/h via INTRAVENOUS
  Filled 2016-03-07 (×8): qty 50

## 2016-03-07 MED ORDER — MIDAZOLAM HCL 2 MG/2ML IJ SOLN
1.0000 mg | INTRAMUSCULAR | Status: DC | PRN
  Administered 2016-03-09 – 2016-03-13 (×8): 2 mg via INTRAVENOUS
  Filled 2016-03-07 (×9): qty 2

## 2016-03-07 MED ORDER — POTASSIUM CHLORIDE 20 MEQ/15ML (10%) PO SOLN
20.0000 meq | ORAL | Status: AC
  Administered 2016-03-07 (×2): 20 meq
  Filled 2016-03-07 (×2): qty 15

## 2016-03-07 NOTE — Progress Notes (Signed)
Physical Therapy Treatment Patient Details Name: Kendra Osborne MRN: [id number] DOB: 01-Aug-1946 Today's Date: 03/07/2016    History of Present Illness pt presents with Large thyroid mass/goiter and Respiratory Difficulties.  pt has underwent Total Thyroidectomy with goiter removal and has remained intubated with one failed extubation on 3/24.  pt recently came to the [place] from [place] and only known medical hx is HTN and Goiter.      PT Comments    Pt continues to be on continuous Fentanyl, but is restless and emotional at times during session.  Pt does participate in bed mobility, though oral secretions elicited more coughing today.  Son present during part of session and does attempt to communicate with pt at times.  Allowed pt to attempt to write to son, but not able to produce actual words in her native language according to son.  VSS throughout session and pt tolerating mobility while on vent.  Will continue to follow.    Follow Up Recommendations  CIR     Equipment Recommendations  None recommended by PT    Recommendations for Other Services       Precautions / Restrictions Precautions Precautions: Fall Precaution Comments: Vent Restrictions Weight Bearing Restrictions: No    Mobility  Bed Mobility Overal bed mobility: Needs Assistance;+2 for physical assistance Bed Mobility: Supine to Sit;Sit to Supine     Supine to sit: Mod assist;+2 for physical assistance Sit to supine: Max assist;+2 for physical assistance   General bed mobility comments: pt does follow gestural cueing and participates well with coming to sitting with HOB elevation.  Returning to bed, pt required increased A for trunk and Bil LEs.    Transfers                    Ambulation/Gait                 Stairs            Wheelchair Mobility    Modified Rankin (Stroke Patients Only)       Balance Overall balance assessment: Needs assistance Sitting-balance support: Feet  supported;Single extremity supported;No upper extremity supported Sitting balance-Leahy Scale: Fair Sitting balance - Comments: pt fluctuated between MinG and MinA to maintain sitting balance.  As pt became more fatigued she did require more consistent MinA.   Postural control: Posterior lean                          Cognition Arousal/Alertness: Awake/alert Behavior During Therapy: Restless Overall Cognitive Status: Difficult to assess                      Exercises      General Comments        Pertinent Vitals/Pain Pain Assessment: Faces Faces Pain Scale: Hurts little more Pain Location: pt grimaces at times and seems to indicate ETT.  RN present during session. Pain Descriptors / Indicators: Grimacing Pain Intervention(s): Monitored during session;Repositioned;Premedicated before session    Home Living                      Prior Function            PT Goals (current goals can now be found in the care plan section) Acute Rehab PT Goals Patient Stated Goal: Unable to state. PT Goal Formulation: Patient unable to participate in goal setting Time For Goal Achievement: 03/19/16 Potential to Achieve Goals: Good  Progress towards PT goals: Progressing toward goals    Frequency  Min 3X/week    PT Plan Current plan remains appropriate    Co-evaluation             End of Session Equipment Utilized During Treatment:  (Vent) Activity Tolerance: Patient tolerated treatment well Patient left: in bed;with call bell/phone within reach;with bed alarm set;with family/visitor present     Time: [id number] PT Time Calculation (min) (ACUTE ONLY): 38 min  Charges:  $Therapeutic Activity: 23-37 mins                    G CodesSunny Schlein:      Kendra Osborne F, [place] CarolinaPT [phone] 03/07/2016, 11:29 AM

## 2016-03-07 NOTE — Progress Notes (Signed)
PULMONARY / CRITICAL CARE MEDICINE    Name: Kendra Osborne MRN: [id number] DOB: 1946-05-13    ADMISSION DATE:  02/24/2016  PRIMARY SERVICE: PCCM  CHIEF COMPLAINT:  Dyspnea  BRIEF PATIENT DESCRIPTION: 70 y/o woman recently moved from [place] who presents with respiratory distress. She presented to the ED on 3/17 with complaints of dyspnea, and lower extremity edema. History is limited due to language / culture barriers, but she has a PMHx of a known goiter that has been present for years, as well as possibly hypertension. It is not clear if the goiter was ever worked up or if she ever was on regular therapy for her hypertension. Her Nephew and son (who speaks little [place]) report that she been having a cough for a few days as well as some fevers and chills.  SUBJECTIVE:   Remains critically ill on ventilator.  Afebrile. No obvious pain  REVIEW OF SYSTEMS:  Unable to obtain on ventilator and language barrier.  VITAL SIGNS: Temp:  [97.8 F (36.6 C)-98.8 F (37.1 C)] 98.3 F (36.8 C) (03/29 0800) Pulse Rate:  [45-76] 71 (03/29 0723) Resp:  [12-30] 18 (03/29 0723) BP: (103-181)/(45-158) 165/67 mmHg (03/29 0723) SpO2:  [94 %-100 %] 96 % (03/29 0723) FiO2 (%):  [30 %] 30 % (03/29 0723) Weight:  [98.1 kg (216 lb 4.3 oz)] 98.1 kg (216 lb 4.3 oz) (03/29 0431) HEMODYNAMICS:   VENTILATOR SETTINGS: Vent Mode:  [-] PRVC FiO2 (%):  [30 %] 30 % Set Rate:  [18 bmp] 18 bmp Vt Set:  [420 mL] 420 mL PEEP:  [5 cmH20] 5 cmH20 Plateau Pressure:  [18 cmH20-28 cmH20] 19 cmH20 INTAKE / OUTPUT: Intake/Output      03/28 0701 - 03/29 0700 03/29 0701 - 03/30 0700   I.V. (mL/kg) 749.8 (7.6)    NG/GT 770    IV Piggyback 200    Total Intake(mL/kg) 1719.8 (17.5)    Urine (mL/kg/hr) 2750 (1.2)    Total Output 2750     Net -1030.2            PHYSICAL EXAMINATION: General: acutely ill,  Eyes open. No family at bedside. No acute distress. Integument:  Warm & dry. No rash on exposed skin. Surgical  incision on neck clean & dry. HEENT:  No scleral injection. Endotracheal tube in place.  Cardiovascular:  Regular rate. No edema. No appreciable JVD.  Pulmonary:  Good aeration & clear to auscultation bilaterally. Symmetric chest wall rise on ventilator. Abdomen: Soft. Normal bowel sounds. Nondistended.  Neurological: Spontaneously moving all 4 extremities. Eyes open. Appears grossly nonfocal.  LABS:  CBC  Recent Labs Lab 03/05/16 0450 03/06/16 0400 03/07/16 0430  WBC 8.9 9.9 9.7  HGB 9.7* 9.4* 9.8*  HCT 33.2* 32.7* 33.4*  PLT 309 327 373   Coag's No results for input(s): APTT, INR in the last 168 hours. BMET  Recent Labs Lab 03/05/16 0450 03/06/16 0400 03/07/16 0430  NA 147* 145 144  K 3.5 3.3* 3.4*  CL 104 104 103  CO2 31 32 31  BUN CREATININE 0.77 0.84 1.03*  GLUCOSE 116* 96 105*   Electrolytes  Recent Labs Lab 03/05/16 0450 03/06/16 0400 03/07/16 0430  CALCIUM 7.7* 8.0* 8.3*  MG 1.7 1.7 2.1  PHOS 3.6 3.0 3.8   Sepsis Markers No results for input(s): LATICACIDVEN, PROCALCITON, O2SATVEN in the last 168 hours. ABG  Recent Labs Lab 03/02/16 1055  PHART 7.382  PCO2ART 48.2*  PO2ART 109*   Liver Enzymes  Recent Labs Lab 03/05/16 0450 03/06/16 0400 03/07/16 0430  ALBUMIN 2.0* 2.0* 2.0*   Cardiac Enzymes No results for input(s): TROPONINI, PROBNP in the last 168 hours. Glucose  Recent Labs Lab 03/06/16 1144 03/06/16 1549 03/06/16 1924 03/06/16 2324 03/07/16 0313 03/07/16 0721  GLUCAP 111* 95 93 79 84 104*    Imaging Dg Chest Port 1 View  03/06/2016  CLINICAL DATA:  Respiratory failure EXAM: PORTABLE CHEST 1 VIEW COMPARISON:  03/03/2016 FINDINGS: Cardiac shadow is stable. The left subclavian central line, endotracheal tube and nasogastric catheter are again seen and stable. Bibasilar atelectatic changes are again seen and stable. No acute abnormality is noted. IMPRESSION: No change from the prior exam. Electronically Signed    By: Alcide CleverMark  Lukens M.D.   On: 03/06/2016 07:37   SIGNIFICANT EVENTS: 3/17 - Admit 3/21 - Thyroidectomy 3/24 - Extubation & reintubation within 2 hours due to hypoxia & secretions  STUDIES:  CT-A of Chest >> mild mediastinal enlargement CT of the Neck with contrast >> multinodular goiter, meningioma Port CXR 3/22:  ETT & CVL in good position. Blunting bilateral costophrenic angles suggestive of effusions. Bilateral hilar fullness & interstitial prominence with low lung volumes.  Port CXR 3/25:. Silhouetting of the left hemidiaphragm. Platelike atelectasis right lower lung. Endotracheal tube in good position.  LINES / TUBES: OETT 7.5 3/17 - 3/24; 7.0 3/24>>> L Subclavian CVL 3/21>>> L Port 3/21>>> OGT 3/21 - 3/24; 3/24>>> Foley 3/18>>> PIV x1 L Radial Art Line 3/21 - 3/23 R & L Neck Drains 3/21 - ???  MICROBIOLOGY: Tracheal Asp Ctx 3/25>>> ng Blood Ctx x2 3/18:  Negative  MRSA PCR 3/18:  Negative   ANTIBIOTICS: Unasyn 3/24>>> Ancef 3/21 - 3/24 Clindamycin 3/21 (only 1 dose) Azithromycin 3/18 - 3/19 Rocephin 3/18 - 3/20  ASSESSMENT / PLAN:  PULMONARY A: Acute on Chronic Hypercarbic Respiratory Failure Acute Hypoxic Respiratory Failure - Question element of pulmonary edema vs viral bronchitis/pneumonitis. Extrinsic Airway Compression - Secondary to goiter. S/P total thyroidectomy 3/21.  P:   PS trials, no extubation given increased secretions and no cuff leak. Plan trach on Friday by ENT given thyroid concerns.  CARDIOVASCULAR A: HTN - TTE 3/19 nml LV fn, gr 1 DD.  P:   Hydralazine IV prn Continuing Lasix IV q12hr for diuresis  RENAL A: Hypokalemia - Replacing. Hypernatremia - Mild but stable. Hypomagnesemia - Resolved. Hyponatremia - Resolved. Acute Renal Failure - Resolved.  P:   Trending UOP with Foley while on lasix Monitor electrolytes & renal function daily Replete lytes as needed Ct free water  GASTROINTESTINAL A: No acute issues.  P:    Protonix  daily Continue tube feedings  HEMATOLOGIC A: Anemia - Mild.  P:   Trending cell counts daily w/ CBC SCDs Start Heparin [place] q8hr  INFECTIOUS A: CAP vs Aspiration Pneumonia  P:   Unasyn Day #6/8 Monitor for fever & leukocytosis  ENDOCRINE A: S/P Total Thyroidectomy - Previously normal TSH.  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  FAMILY UPDATE:  No family bedside.11.  TODAY'S SUMMARY:  70 year old with large goiter and questionable community acquired vs aspiration  pneumonia. Failed extubation 3/24 . Continuing diuresis and Unasyn for now. Plan for  another trial of extubation to bipap - if fails again will  certainly need trach  The patient is critically ill with multiple organ systems failure and requires high complexity decision making for assessment and support, frequent evaluation and titration of therapies, application of advanced monitoring technologies and extensive interpretation of multiple databases.   Critical Care Time devoted to patient care services described in this note is  35  Minutes. This time reflects time of care of this signee Dr Koren Bound. This critical care time does not reflect procedure time, or teaching time or supervisory time of PA/NP/Med student/Med Resident etc but could involve care discussion time.  Alyson Reedy, M.D. [facility] Pulmonary/Critical Care Medicine. Pager: [phone]. After hours pager: [phone].  03/07/2016

## 2016-03-07 NOTE — Progress Notes (Signed)
[facility] FOR AM LAB REPLACEMENT ONLY  The patient does apply for the [facility] Electrolyte [facility] Protocol based on the criteria listed below:   1. Is GFR >/= 40 ml/min? Yes.    Patient's GFR today is >60 2. Is urine output >/= 0.5 ml/kg/hr for the last 6 hours? Yes.   Patient's UOP is 1.8 ml/kg/hr 3. Is BUN < 60 mg/dL? Yes.    Patient's BUN today is 19 4. Abnormal electrolyte  K 3.4 5. Ordered repletion with: per protocol 6. If a panic level lab has been reported, has the CCM MD in charge been notified? Yes.  .   Physician:  Tonny BranchSommer  Hanson Medeiros, Lang Snowlizabeth McEachran 03/07/2016 6:09 AM

## 2016-03-08 ENCOUNTER — Inpatient Hospital Stay (HOSPITAL_COMMUNITY): Payer: Medicaid Other

## 2016-03-08 DIAGNOSIS — G9340 Encephalopathy, unspecified: Secondary | ICD-10-CM

## 2016-03-08 DIAGNOSIS — E876 Hypokalemia: Secondary | ICD-10-CM

## 2016-03-08 LAB — CBC WITH DIFFERENTIAL/PLATELET
BASOS ABS: 0 10*3/uL (ref 0.0–0.1)
BASOS PCT: 0 %
Eosinophils Absolute: 0.6 10*3/uL (ref 0.0–0.7)
Eosinophils Relative: 8 %
HCT: 34.9 % — ABNORMAL LOW (ref 36.0–46.0)
Hemoglobin: 10.4 g/dL — ABNORMAL LOW (ref 12.0–15.0)
LYMPHS PCT: 22 %
Lymphs Abs: 1.7 10*3/uL (ref 0.7–4.0)
MCH: 25.3 pg — ABNORMAL LOW (ref 26.0–34.0)
MCHC: 29.8 g/dL — ABNORMAL LOW (ref 30.0–36.0)
MCV: 84.9 fL (ref 78.0–100.0)
MONO ABS: 0.6 10*3/uL (ref 0.1–1.0)
Monocytes Relative: 8 %
Neutro Abs: 4.8 10*3/uL (ref 1.7–7.7)
Neutrophils Relative %: 62 %
PLATELETS: 340 10*3/uL (ref 150–400)
RBC: 4.11 MIL/uL (ref 3.87–5.11)
RDW: 17.9 % — AB (ref 11.5–15.5)
WBC: 7.7 10*3/uL (ref 4.0–10.5)

## 2016-03-08 LAB — BLOOD GAS, ARTERIAL
ACID-BASE EXCESS: 5.7 mmol/L — AB (ref 0.0–2.0)
Bicarbonate: 29.8 mEq/L — ABNORMAL HIGH (ref 20.0–24.0)
DRAWN BY: 290171
FIO2: 0.3
LHR: 18 {breaths}/min
MECHVT: 420 mL
O2 Saturation: 96 %
PATIENT TEMPERATURE: 98.6
PCO2 ART: 45.1 mmHg — AB (ref 35.0–45.0)
PEEP/CPAP: 5 cmH2O
PH ART: 7.436 (ref 7.350–7.450)
PO2 ART: 86.1 mmHg (ref 80.0–100.0)
TCO2: 31.2 mmol/L (ref 0–100)

## 2016-03-08 LAB — GLUCOSE, CAPILLARY
GLUCOSE-CAPILLARY: 138 mg/dL — AB (ref 65–99)
GLUCOSE-CAPILLARY: 100 mg/dL — AB (ref 65–99)
Glucose-Capillary: 105 mg/dL — ABNORMAL HIGH (ref 65–99)
Glucose-Capillary: 104 mg/dL — ABNORMAL HIGH (ref 65–99)
Glucose-Capillary: 106 mg/dL — ABNORMAL HIGH (ref 65–99)

## 2016-03-08 LAB — RENAL FUNCTION PANEL
ALBUMIN: 2.1 g/dL — AB (ref 3.5–5.0)
Anion gap: 12 (ref 5–15)
BUN: 16 mg/dL (ref 6–20)
CHLORIDE: 100 mmol/L — AB (ref 101–111)
CO2: 30 mmol/L (ref 22–32)
CREATININE: 0.84 mg/dL (ref 0.44–1.00)
Calcium: 8.6 mg/dL — ABNORMAL LOW (ref 8.9–10.3)
Glucose, Bld: 111 mg/dL — ABNORMAL HIGH (ref 65–99)
Phosphorus: 4.3 mg/dL (ref 2.5–4.6)
Potassium: 3 mmol/L — ABNORMAL LOW (ref 3.5–5.1)
Sodium: 142 mmol/L (ref 135–145)

## 2016-03-08 LAB — TRIGLYCERIDES: Triglycerides: 213 mg/dL — ABNORMAL HIGH (ref <150)

## 2016-03-08 LAB — MAGNESIUM: MAGNESIUM: 2 mg/dL (ref 1.7–2.4)

## 2016-03-08 MED ORDER — POTASSIUM CHLORIDE 20 MEQ/15ML (10%) PO SOLN
40.0000 meq | Freq: Three times a day (TID) | ORAL | Status: AC
  Administered 2016-03-08 (×2): 40 meq
  Filled 2016-03-08 (×2): qty 30

## 2016-03-08 MED ORDER — POTASSIUM CHLORIDE 10 MEQ/50ML IV SOLN
10.0000 meq | INTRAVENOUS | Status: DC
Start: 2016-03-08 — End: 2016-03-08
  Administered 2016-03-08 (×3): 10 meq via INTRAVENOUS
  Filled 2016-03-08 (×4): qty 50

## 2016-03-08 NOTE — Progress Notes (Signed)
Inpatient Rehabilitation  Note OT/PT recommendations and that patient has plans for trach placement tomorrow, 3/31.  Please order consult when patient in medically ready.  Call with questions.    Charlane FerrettiMelissa Elmina Hendel, M.A., CCC/SLP Admission Coordinator  [facility]  Cell [phone]

## 2016-03-08 NOTE — Progress Notes (Signed)
PULMONARY / CRITICAL CARE MEDICINE    Name: Kendra Osborne MRN: [id number] DOB: 10-Jan-1946    ADMISSION DATE:  02/24/2016  PRIMARY SERVICE: PCCM  CHIEF COMPLAINT:  Dyspnea  BRIEF PATIENT DESCRIPTION: 70 y/o woman recently moved from [place] who presents with respiratory distress. She presented to the ED on 3/17 with complaints of dyspnea, and lower extremity edema. History is limited due to language / culture barriers, but she has a PMHx of a known goiter that has been present for years, as well as possibly hypertension. It is not clear if the goiter was ever worked up or if she ever was on regular therapy for her hypertension. Her Nephew and son (who speaks little [place]) report that she been having a cough for a few days as well as some fevers and chills.  SUBJECTIVE:   Remains critically ill on ventilator.  Afebrile. No obvious pain  REVIEW OF SYSTEMS:  Unable to obtain on ventilator and language barrier.  VITAL SIGNS: Temp:  [97.5 F (36.4 C)-98.6 F (37 C)] 97.5 F (36.4 C) (03/30 0800) Pulse Rate:  [30-78] 61 (03/30 0810) Resp:  [16-27] 20 (03/30 0810) BP: (108-204)/(46-94) 108/47 mmHg (03/30 0800) SpO2:  [95 %-100 %] 99 % (03/30 0810) FiO2 (%):  [30 %-40 %] 40 % (03/30 0732) Weight:  [97.2 kg (214 lb 4.6 oz)] 97.2 kg (214 lb 4.6 oz) (03/30 0300) HEMODYNAMICS:   VENTILATOR SETTINGS: Vent Mode:  [-] PSV;CPAP FiO2 (%):  [30 %-40 %] 40 % Set Rate:  [18 bmp] 18 bmp Vt Set:  [420 mL] 420 mL PEEP:  [5 cmH20] 5 cmH20 Pressure Support:  [10 cmH20] 10 cmH20 Plateau Pressure:  [19 cmH20-26 cmH20] 23 cmH20 INTAKE / OUTPUT: Intake/Output      03/29 0701 - 03/30 0700 03/30 0701 - 03/31 0700   I.V. (mL/kg) 547.7 (5.6)    NG/GT 1160    IV Piggyback 450    Total Intake(mL/kg) 2157.7 (22.2)    Urine (mL/kg/hr) 2700 (1.2)    Total Output 2700     Net -542.3            PHYSICAL EXAMINATION: General: acutely ill,  Eyes open. No family at bedside. No acute  distress. Integument:  Warm & dry. No rash on exposed skin. Surgical incision on neck clean & dry. HEENT:  No scleral injection. Endotracheal tube in place.  Cardiovascular:  Regular rate. No edema. No appreciable JVD.  Pulmonary:  Good aeration & clear to auscultation bilaterally. Symmetric chest wall rise on ventilator. Abdomen: Soft. Normal bowel sounds. Nondistended.  Neurological: Spontaneously moving all 4 extremities. Eyes open. Appears grossly nonfocal.  LABS:  CBC  Recent Labs Lab 03/06/16 0400 03/07/16 0430 03/08/16 0430  WBC 9.9 9.7 7.7  HGB 9.4* 9.8* 10.4*  HCT 32.7* 33.4* 34.9*  PLT 327 373 340   Coag's No results for input(s): APTT, INR in the last 168 hours. BMET  Recent Labs Lab 03/06/16 0400 03/07/16 0430 03/08/16 0430  NA 145 144 142  K 3.3* 3.4* 3.0*  CL 104 103 100*  CO2 32 31 30  BUN CREATININE 0.84 1.03* 0.84  GLUCOSE 96 105* 111*   Electrolytes  Recent Labs Lab 03/06/16 0400 03/07/16 0430 03/08/16 0430  CALCIUM 8.0* 8.3* 8.6*  MG 1.7 2.1 2.0  PHOS 3.0 3.8 4.3   Sepsis Markers No results for input(s): LATICACIDVEN, PROCALCITON, O2SATVEN in the last 168 hours. ABG  Recent Labs Lab 03/02/16 1055 03/08/16 0341  PHART 7.382  7.436  PCO2ART 48.2* 45.1*  PO2ART 109* 86.1   Liver Enzymes  Recent Labs Lab 03/06/16 0400 03/07/16 0430 03/08/16 0430  ALBUMIN 2.0* 2.0* 2.1*   Cardiac Enzymes No results for input(s): TROPONINI, PROBNP in the last 168 hours. Glucose  Recent Labs Lab 03/07/16 1131 03/07/16 1527 03/07/16 1931 03/07/16 2311 03/08/16 0314 03/08/16 0742  GLUCAP 83 97 102* 99 105* 138*    Imaging Dg Chest Port 1 View  03/08/2016  CLINICAL DATA:  70 year old female with endotracheal tube in place. Respiratory failure. Hypertension. Subsequent encounter. EXAM: PORTABLE CHEST 1 VIEW COMPARISON:  03/06/2016. FINDINGS: Endotracheal tube tip 4 cm above the carina. Nasogastric tube courses below the  diaphragm. Tip is not included on the present exam. Left central line tip projects at the expected junction of the left brachiocephalic vein/ superior vena cava. No gross pneumothorax. Cardiomegaly. Poor inspiration. Bibasilar subsegmental atelectasis. Difficult to exclude subtle basilar infiltrate. Pulmonary vascular prominence most notable centrally. Minimal peribronchial thickening. Calcified mildly tortuous aorta. IMPRESSION: Poor inspiration with bibasilar atelectatic changes. Cardiomegaly with central pulmonary vascular prominence. Minimal peribronchial thickening. Electronically Signed   By: Lacy Duverney M.D.   On: 03/08/2016 07:28   SIGNIFICANT EVENTS: 3/17 - Admit 3/21 - Thyroidectomy 3/24 - Extubation & reintubation within 2 hours due to hypoxia & secretions  STUDIES:  CT-A of Chest >> mild mediastinal enlargement CT of the Neck with contrast >> multinodular goiter, meningioma Port CXR 3/22:  ETT & CVL in good position. Blunting bilateral costophrenic angles suggestive of effusions. Bilateral hilar fullness & interstitial prominence with low lung volumes.  Port CXR 3/25:. Silhouetting of the left hemidiaphragm. Platelike atelectasis right lower lung. Endotracheal tube in good position.  LINES / TUBES: OETT 7.5 3/17 - 3/24; 7.0 3/24>>> L Subclavian CVL 3/21>>> L Port 3/21>>> OGT 3/21 - 3/24; 3/24>>> Foley 3/18>>> PIV x1 L Radial Art Line 3/21 - 3/23 R & L Neck Drains 3/21 - ???  MICROBIOLOGY: Tracheal Asp Ctx 3/25>>> ng Blood Ctx x2 3/18:  Negative  MRSA PCR 3/18:  Negative   ANTIBIOTICS: Unasyn 3/24>>> Ancef 3/21 - 3/24 Clindamycin 3/21 (only 1 dose) Azithromycin 3/18 - 3/19 Rocephin 3/18 - 3/20  ASSESSMENT / PLAN:  PULMONARY A: Acute on Chronic Hypercarbic Respiratory Failure Acute Hypoxic Respiratory Failure - Question element of pulmonary edema vs viral bronchitis/pneumonitis. Extrinsic Airway Compression - Secondary to goiter. S/P total thyroidectomy  3/21.  P:   PS trials, no extubation given WOB and no cuff leak. Plan trach on Friday by ENT given thyroid concerns.  CARDIOVASCULAR A: HTN - TTE 3/19 nml LV fn, gr 1 DD.  P:   Hydralazine IV prn Continuing Lasix IV q12hr for diuresis  RENAL A: Hypokalemia - Replacing. Hypernatremia - Mild but stable. Hypomagnesemia - Resolved. Hyponatremia - Resolved. Acute Renal Failure - Resolved.  P:   Trending UOP with Foley while on lasix. Monitor electrolytes & renal function daily. Replete lytes as needed. Ct free water.  GASTROINTESTINAL A: No acute issues.  P:   Protonix  daily Continue tube feedings  HEMATOLOGIC A: Anemia - Mild.  P:   Trending cell counts daily w/ CBC SCDs Continue Heparin [place] q8hr  INFECTIOUS A: CAP vs Aspiration Pneumonia  P:   Unasyn Day #7/8 Monitor for fever & leukocytosis  ENDOCRINE A: S/P Total Thyroidectomy - Previously normal TSH.  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  FAMILY UPDATE:  No family bedside.  TODAY'S SUMMARY:  70 year old with large goiter and questionable community acquired vs aspiration  pneumonia.  Failed extubation 3/24 . Continuing diuresis and Unasyn for now. Plan for  another trial of extubation to bipap - if fails again will certainly need trach  The patient is critically ill with multiple organ systems failure and requires high complexity decision making for assessment and support, frequent evaluation and titration of therapies, application of advanced monitoring technologies and extensive interpretation of multiple databases.   Critical Care Time devoted to patient care services described in this note is  35  Minutes. This time reflects time of care of this signee Dr Koren BoundWesam Yacoub. This critical care time does not  reflect procedure time, or teaching time or supervisory time of PA/NP/Med student/Med Resident etc but could involve care discussion time.  Alyson ReedyWesam G. Yacoub, M.D. [facility] Pulmonary/Critical Care Medicine. Pager: [phone]. After hours pager: [phone].  03/08/2016

## 2016-03-08 NOTE — Progress Notes (Signed)
Occupational Therapy Treatment Patient Details Name: Kendra Osborne MRN: [id number] DOB: [birth year] Today's Date: 03/08/2016    History of present illness pt presents with Large thyroid mass/goiter and Respiratory Difficulties.  pt has underwent Total Thyroidectomy with goiter removal and has remained intubated with one failed extubation on 3/24.  pt recently came to the [place] from [place] and only known medical hx is HTN and Goiter.     OT comments  Pt sat EOB x 7 mins with total A.  She is lethargic today with minimal arousal - likely due to sedating meds.  VSS.   Follow Up Recommendations  CIR;Supervision/Assistance - 24 hour    Equipment Recommendations  3 in 1 bedside comode    Recommendations for Other Services      Precautions / Restrictions Precautions Precautions: Fall Precaution Comments: Vent       Mobility Bed Mobility Overal bed mobility: Needs Assistance;+2 for physical assistance Bed Mobility: Supine to Sit;Sit to Supine     Supine to sit: Total assist;+2 for physical assistance Sit to supine: Total assist;+2 for physical assistance   General bed mobility comments: Pt did not attempt to assist to EOB.  She did attempt to assist minimally upon returning back to bed   Transfers                 General transfer comment: unable to safely attempt     Balance Overall balance assessment: Needs assistance Sitting-balance support: Feet supported Sitting balance-Leahy Scale: Poor Sitting balance - Comments: required max A today - likely due to sedating medications                            ADL                                         General ADL Comments: Pt on Fentanyl.  Aroused briefly when moved to EOB, but unable to rouse her further until returned to bed.       Vision                     Perception     Praxis      Cognition   Behavior During Therapy: Flat affect Overall Cognitive Status: Difficult to  assess                       Extremity/Trunk Assessment               Exercises     Shoulder Instructions       General Comments      Pertinent Vitals/ Pain       Pain Assessment: Faces Faces Pain Scale: No hurt  Home Living                                          Prior Functioning/Environment              Frequency Min 2X/week     Progress Toward Goals  OT Goals(current goals can now be found in the care plan section)  Progress towards OT goals: Not progressing toward goals - comment (lethargy )  ADL Goals Pt Will Perform Grooming: with min assist;standing Pt Will Perform Upper Body Bathing:  with supervision;sitting Pt Will Perform Lower Body Bathing: with min assist;sit to/from stand Pt Will Perform Upper Body Dressing: with min assist;sitting Pt Will Perform Lower Body Dressing: with min assist;sit to/from stand Pt Will Transfer to Toilet: with min assist;ambulating;regular height toilet;bedside commode;grab bars Pt Will Perform Toileting - Clothing Manipulation and hygiene: with min assist;sit to/from stand Pt/caregiver will Perform Home Exercise Program: Increased strength;Both right and left upper extremity;With theraband;With Supervision;With written HEP provided  Plan Discharge plan remains appropriate    Co-evaluation                 End of Session Equipment Utilized During Treatment: Oxygen (vent ETT )   Activity Tolerance Patient limited by lethargy   Patient Left in bed;with call bell/phone within reach   Nurse Communication Mobility status        Time: 7829-56211134-1151 OT Time Calculation (min): 17 min  Charges: OT General Charges $OT Visit: 1 Procedure OT Treatments $Therapeutic Activity: 8-22 mins  Kendra Osborne M 03/08/2016, 1:22 PM

## 2016-03-08 NOTE — Progress Notes (Signed)
Pt remains sedated and on ventilator today.  Plan tracheostomy in OR tomorrow with ENT on Friday, 03/09/16.  PT/OT continue to recommend CIR as DC disposition.  Will follow for dc planning as pt progresses.    Quintella BatonJulie W. Miaa Latterell, RN, BSN  Trauma/Neuro ICU Case Manager [phone]

## 2016-03-09 ENCOUNTER — Encounter (HOSPITAL_COMMUNITY): Payer: Self-pay | Admitting: Certified Registered Nurse Anesthetist

## 2016-03-09 ENCOUNTER — Inpatient Hospital Stay (HOSPITAL_COMMUNITY): Payer: Medicaid Other | Admitting: Anesthesiology

## 2016-03-09 ENCOUNTER — Inpatient Hospital Stay (HOSPITAL_COMMUNITY): Payer: Medicaid Other

## 2016-03-09 ENCOUNTER — Encounter (HOSPITAL_COMMUNITY)
Admission: EM | Disposition: A | Discharge status: Home Health | Payer: Self-pay | Source: Home / Self Care | Attending: Internal Medicine

## 2016-03-09 HISTORY — PX: TRACHEOSTOMY TUBE PLACEMENT: SHX814

## 2016-03-09 LAB — POCT I-STAT 3, ART BLOOD GAS (G3+)
Acid-Base Excess: 8 mmol/L — ABNORMAL HIGH (ref 0.0–2.0)
Bicarbonate: 34.2 mEq/L — ABNORMAL HIGH (ref 20.0–24.0)
O2 SAT: 94 %
Patient temperature: 98.6
TCO2: 36 mmol/L (ref 0–100)
pCO2 arterial: 54.6 mmHg — ABNORMAL HIGH (ref 35.0–45.0)
pH, Arterial: 7.404 (ref 7.350–7.450)
pO2, Arterial: 71 mmHg — ABNORMAL LOW (ref 80.0–100.0)

## 2016-03-09 LAB — RENAL FUNCTION PANEL
ANION GAP: 11 (ref 5–15)
Albumin: 2.2 g/dL — ABNORMAL LOW (ref 3.5–5.0)
BUN: 18 mg/dL (ref 6–20)
CHLORIDE: 100 mmol/L — AB (ref 101–111)
CO2: 29 mmol/L (ref 22–32)
Calcium: 8.8 mg/dL — ABNORMAL LOW (ref 8.9–10.3)
Creatinine, Ser: 0.88 mg/dL (ref 0.44–1.00)
GFR calc Af Amer: >60 mL/min (ref >60)
GFR calc non Af Amer: >60 mL/min (ref >60)
GLUCOSE: 112 mg/dL — AB (ref 65–99)
POTASSIUM: 3.8 mmol/L (ref 3.5–5.1)
Phosphorus: 3.9 mg/dL (ref 2.5–4.6)
Sodium: 140 mmol/L (ref 135–145)

## 2016-03-09 LAB — CBC WITH DIFFERENTIAL/PLATELET
BASOS PCT: 0 %
Basophils Absolute: 0 10*3/uL (ref 0.0–0.1)
EOS ABS: 0.4 10*3/uL (ref 0.0–0.7)
Eosinophils Relative: 5 %
HCT: 35.2 % — ABNORMAL LOW (ref 36.0–46.0)
Hemoglobin: 10.2 g/dL — ABNORMAL LOW (ref 12.0–15.0)
Lymphocytes Relative: 20 %
Lymphs Abs: 1.6 10*3/uL (ref 0.7–4.0)
MCH: 24.8 pg — ABNORMAL LOW (ref 26.0–34.0)
MCHC: 29 g/dL — AB (ref 30.0–36.0)
MCV: 85.6 fL (ref 78.0–100.0)
MONO ABS: 0.6 10*3/uL (ref 0.1–1.0)
MONOS PCT: 8 %
NEUTROS PCT: 67 %
Neutro Abs: 5.5 10*3/uL (ref 1.7–7.7)
PLATELETS: 360 10*3/uL (ref 150–400)
RBC: 4.11 MIL/uL (ref 3.87–5.11)
RDW: 17.7 % — AB (ref 11.5–15.5)
WBC: 8.1 10*3/uL (ref 4.0–10.5)

## 2016-03-09 LAB — GLUCOSE, CAPILLARY
GLUCOSE-CAPILLARY: 107 mg/dL — AB (ref 65–99)
GLUCOSE-CAPILLARY: 91 mg/dL (ref 65–99)
GLUCOSE-CAPILLARY: 92 mg/dL (ref 65–99)
GLUCOSE-CAPILLARY: 96 mg/dL (ref 65–99)
Glucose-Capillary: 98 mg/dL (ref 65–99)
Glucose-Capillary: 99 mg/dL (ref 65–99)

## 2016-03-09 LAB — MAGNESIUM: MAGNESIUM: 1.7 mg/dL (ref 1.7–2.4)

## 2016-03-09 LAB — TRIGLYCERIDES: Triglycerides: 188 mg/dL — ABNORMAL HIGH (ref <150)

## 2016-03-09 SURGERY — CREATION, TRACHEOSTOMY
Anesthesia: General | Site: Neck

## 2016-03-09 MED ORDER — 0.9 % SODIUM CHLORIDE (POUR BTL) OPTIME
TOPICAL | Status: DC | PRN
  Administered 2016-03-09: 1000 mL

## 2016-03-09 MED ORDER — DEXMEDETOMIDINE HCL IN NACL 400 MCG/100ML IV SOLN
0.4000 ug/kg/h | INTRAVENOUS | Status: DC
  Administered 2016-03-09 (×3): 0.6 ug/kg/h via INTRAVENOUS
  Administered 2016-03-10: 1 ug/kg/h via INTRAVENOUS
  Administered 2016-03-10: 1.2 ug/kg/h via INTRAVENOUS
  Administered 2016-03-10: 0.6 ug/kg/h via INTRAVENOUS
  Administered 2016-03-10: 0.9 ug/kg/h via INTRAVENOUS
  Administered 2016-03-11: 0.4 ug/kg/h via INTRAVENOUS
  Filled 2016-03-09 (×9): qty 100

## 2016-03-09 MED ORDER — MIDAZOLAM HCL 2 MG/2ML IJ SOLN
INTRAMUSCULAR | Status: AC
  Filled 2016-03-09: qty 2

## 2016-03-09 MED ORDER — FENTANYL CITRATE (PF) 250 MCG/5ML IJ SOLN
INTRAMUSCULAR | Status: AC
  Filled 2016-03-09: qty 5

## 2016-03-09 MED ORDER — LIDOCAINE HCL (CARDIAC) 20 MG/ML IV SOLN
INTRAVENOUS | Status: AC
  Filled 2016-03-09: qty 5

## 2016-03-09 MED ORDER — LACTATED RINGERS IV SOLN
INTRAVENOUS | Status: DC | PRN
  Administered 2016-03-09: 09:00:00 via INTRAVENOUS

## 2016-03-09 MED ORDER — ONDANSETRON HCL 4 MG/2ML IJ SOLN
4.0000 mg | Freq: Three times a day (TID) | INTRAMUSCULAR | Status: DC | PRN
  Administered 2016-03-09 – 2016-04-07 (×3): 4 mg via INTRAVENOUS
  Filled 2016-03-09 (×3): qty 2

## 2016-03-09 MED ORDER — MIDAZOLAM HCL 5 MG/5ML IJ SOLN
INTRAMUSCULAR | Status: DC | PRN
  Administered 2016-03-09 (×2): 2 mg via INTRAVENOUS

## 2016-03-09 MED ORDER — ROCURONIUM BROMIDE 50 MG/5ML IV SOLN
INTRAVENOUS | Status: AC
  Filled 2016-03-09: qty 1

## 2016-03-09 MED ORDER — PROPOFOL 10 MG/ML IV BOLUS
INTRAVENOUS | Status: AC
  Filled 2016-03-09: qty 20

## 2016-03-09 MED ORDER — LIDOCAINE-EPINEPHRINE 1 %-1:100000 IJ SOLN
INTRAMUSCULAR | Status: AC
  Filled 2016-03-09: qty 1

## 2016-03-09 MED ORDER — VITAL HIGH PROTEIN PO LIQD
1000.0000 mL | ORAL | Status: DC
  Administered 2016-03-09 – 2016-03-16 (×9): 1000 mL

## 2016-03-09 MED ORDER — FENTANYL CITRATE (PF) 100 MCG/2ML IJ SOLN
INTRAMUSCULAR | Status: DC | PRN
Start: 2016-03-09 — End: 2016-03-09
  Administered 2016-03-09: 50 ug via INTRAVENOUS
  Administered 2016-03-09: 100 ug via INTRAVENOUS

## 2016-03-09 MED ORDER — LIDOCAINE-EPINEPHRINE 1 %-1:100000 IJ SOLN
INTRAMUSCULAR | Status: DC | PRN
Start: 2016-03-09 — End: 2016-03-09
  Administered 2016-03-09: 10 mL

## 2016-03-09 SURGICAL SUPPLY — 41 items
BLADE SURG 15 STRL LF DISP TIS (BLADE) IMPLANT
BLADE SURG 15 STRL SS (BLADE)
BLADE SURG ROTATE 9660 (MISCELLANEOUS) IMPLANT
CANISTER SUCTION 2500CC (MISCELLANEOUS) ×3 IMPLANT
CLEANER TIP ELECTROSURG 2X2 (MISCELLANEOUS) ×3 IMPLANT
COVER SURGICAL LIGHT HANDLE (MISCELLANEOUS) ×3 IMPLANT
DECANTER SPIKE VIAL GLASS SM (MISCELLANEOUS) ×3 IMPLANT
DRAPE PROXIMA HALF (DRAPES) IMPLANT
ELECT COATED BLADE 2.86 ST (ELECTRODE) ×3 IMPLANT
ELECT REM PT RETURN 9FT ADLT (ELECTROSURGICAL) ×3
ELECTRODE REM PT RTRN 9FT ADLT (ELECTROSURGICAL) ×1 IMPLANT
GAUZE SPONGE 4X4 16PLY XRAY LF (GAUZE/BANDAGES/DRESSINGS) ×3 IMPLANT
GAUZE XEROFORM 5X9 LF (GAUZE/BANDAGES/DRESSINGS) IMPLANT
GLOVE SURG SS PI 7.5 STRL IVOR (GLOVE) ×6 IMPLANT
GOWN STRL REUS W/ TWL LRG LVL3 (GOWN DISPOSABLE) ×3 IMPLANT
GOWN STRL REUS W/TWL LRG LVL3 (GOWN DISPOSABLE) ×6
HOLDER TRACH TUBE VELCRO 19.5 (MISCELLANEOUS) IMPLANT
KIT BASIN OR (CUSTOM PROCEDURE TRAY) ×3 IMPLANT
KIT ROOM TURNOVER OR (KITS) ×3 IMPLANT
KIT SUCTION CATH 14FR (SUCTIONS) IMPLANT
NEEDLE HYPO 25GX1X1/2 BEV (NEEDLE) ×3 IMPLANT
NS IRRIG 1000ML POUR BTL (IV SOLUTION) ×3 IMPLANT
PACK EENT II TURBAN DRAPE (CUSTOM PROCEDURE TRAY) ×3 IMPLANT
PAD ARMBOARD 7.5X6 YLW CONV (MISCELLANEOUS) ×6 IMPLANT
PENCIL BUTTON HOLSTER BLD 10FT (ELECTRODE) ×3 IMPLANT
SPONGE DRAIN TRACH 4X4 STRL 2S (GAUZE/BANDAGES/DRESSINGS) ×3 IMPLANT
SPONGE INTESTINAL PEANUT (DISPOSABLE) IMPLANT
SUT CHROMIC 2 0 SH (SUTURE) ×3 IMPLANT
SUT ETHILON 2 0 FS 18 (SUTURE) ×6 IMPLANT
SUT SILK 2 0 FS (SUTURE) ×3 IMPLANT
SUT SILK 3 0 REEL (SUTURE) ×3 IMPLANT
SUT VIC AB 2-0 SH 27 (SUTURE) ×2
SUT VIC AB 2-0 SH 27XBRD (SUTURE) ×1 IMPLANT
SYR 20CC LL (SYRINGE) ×3 IMPLANT
SYR BULB IRRIGATION 50ML (SYRINGE) IMPLANT
SYR CONTROL 10ML LL (SYRINGE) ×3 IMPLANT
TOWEL OR 17X24 6PK STRL BLUE (TOWEL DISPOSABLE) ×3 IMPLANT
TUBE CONNECTING 12'X1/4 (SUCTIONS) ×1
TUBE CONNECTING 12X1/4 (SUCTIONS) ×2 IMPLANT
TUBE TRACH SHILEY  6 DIST  CUF (TUBING) IMPLANT
WATER STERILE IRR 1000ML POUR (IV SOLUTION) ×3 IMPLANT

## 2016-03-09 NOTE — Progress Notes (Signed)
PT Cancellation Note  Patient Details Name: Kendra Osborne MRN: [id number] DOB: 01/09/1946   Cancelled Treatment:    Reason Eval/Treat Not Completed: Patient at procedure or test/unavailable.  Pt on OR schedule for 830 this am for Trach placement.  Will f/u this pm as appropriate.     Alvetta Hidrogo, Alison MurrayMegan F 03/09/2016, 8:26 AM

## 2016-03-09 NOTE — Op Note (Signed)
03/09/2016 10:03 AM  [name],  Kendra Osborne [id number]  Pre-Op Dx: respiratory failure  Post-Op Dx:  Respiratory failure  Proc:  Tracheostomy 31600  Surg:  Shellia CleverlyGore, Marjorie Lussier  Anes:  GOT converted to general via tracheotomy  EBL: minimal  Comp:  none  Findings:  healing post-thyroidectomy cavity with mild serous fluid collection suctioned out. Placed 6 proximal XLT tracheostomy tube given deep neck anatomy  Procedure:  The patient was brought from the intensive care unit to the operating room and transferred to an operating table.  Anesthesia was administered per indwelling orotracheal tube.  The patient was placed in a slight reverse Trendelenburg.  Neck extension was achieved as possible.  The previous thyroidectomy incision was healing normally. 1% Xylocaine with 1:100,000 epinephrine, 10 cc's, was infiltrated into the surgical field for intraoperative hemostasis.  Several minutes were allowed for this to take effect.  A Betadine sterile preparation  of the lower neck and upper chest was performed in the standard fashion.  Sterile draping was accomplished in the standard fashion.  A 2 cm transverse incision was made sharply approximately halfway between the sternal notch and cricoid cartilage and extended through skin and subcutaneous fat in teh midportion of her previous thyroidectomy incision. I suctioned out some expected serous fluid. Using cautery, the superficial layer of the deep cervical fascia was lysed.  The strap muscles and the midline raphe were already lateralized from her massive thyroid goiter and from her previous total thyroidectomy, so the pretracheal plane was visualized and this was entered bluntly.  The thyroid  was surgically absent.  The anterior face of the trachea was cleared.  In the 2-3 interspace, a transverse incision was made between cartilage rings into the tracheal lumen.  A 6 mm wide inferiorly based flap was generated and secured to the lower wound with a 2-0 vicryl  suture.  Mucosal edges were cauterized for hemostasis as needed.  A previously tested  # 6 proximal XLT [place] cuffed tracheostomy tube was brought into the field.  With the endotracheal tube under direct visualization through the tracheostomy, it was gently backed up.  The tracheostomy tube was inserted into the tracheal lumen.  Hemostasis was observed. The cuff was inflated and observed to be intact and containing pressure. The inner cannula was placed and ventilation assumed per tracheostomy tube.  Good chest wall motion was observed, and CO2 was documented per anesthesia.  The trach tube was secured in the standard fashion with velcro ties and 4 quadrant silk sutures.  Hemostasis was observed again.  When satisfactory ventilation was assured, the orotracheal tube was removed and a new nasogastric tube was placed in the nasal cavity and gastric secretions were confirmed.  At this point the procedure was completed.  The patient was returned to anesthesia, and transferred back to the intensive care unit in stable condition. The patient tolerated the procedure well with no immediate complications.  Dr. Melvenia BeamMitchell Abu Osborne was present and performed the entire procedure.  03/09/2016 10:03 AM Kendra BeamGore, Buffy Ehler

## 2016-03-09 NOTE — Transfer of Care (Signed)
Immediate Anesthesia Transfer of Care Note  Patient: Kendra Osborne  Procedure(s) Performed: Procedure(s): TRACHEOSTOMY (N/A)  Patient Location: ICU  Anesthesia Type:General  Level of Consciousness: Patient remains intubated per anesthesia plan  Airway & Oxygen Therapy: Patient placed on Ventilator (see vital sign flow sheet for setting)  Post-op Assessment: Report given to RN and Post -op Vital signs reviewed and stable  Post vital signs: Reviewed and stable  Last Vitals:  Filed Vitals:   03/09/16 0900 03/09/16 0910  BP: 187/86 137/33  Pulse: 51 56  Temp:    Resp: 17 20    Complications: No apparent anesthesia complications

## 2016-03-09 NOTE — Progress Notes (Signed)
PULMONARY / CRITICAL CARE MEDICINE    Name: Kendra Osborne MRN: [id number] DOB: [birth year]    ADMISSION DATE:  02/24/2016  PRIMARY SERVICE: PCCM  CHIEF COMPLAINT:  Dyspnea  BRIEF PATIENT DESCRIPTION: 70 y/o woman recently moved from [place] who presents with respiratory distress. She presented to the ED on 3/17 with complaints of dyspnea, and lower extremity edema. History is limited due to language / culture barriers, but she has a PMHx of a known goiter that has been present for years, as well as possibly hypertension. It is not clear if the goiter was ever worked up or if she ever was on regular therapy for her hypertension. Her Nephew and son (who speaks little [place]) report that she been having a cough for a few days as well as some fevers and chills.  SUBJECTIVE:   Remains critically ill on ventilator.  Afebrile. No obvious pain  REVIEW OF SYSTEMS:  Unable to obtain on ventilator and language barrier.  VITAL SIGNS: Temp:  [97.3 F (36.3 C)-98.8 F (37.1 C)] 97.5 F (36.4 C) (03/31 0800) Pulse Rate:  [51-69] 56 (03/31 0910) Resp:  [17-31] 20 (03/31 0910) BP: (104-200)/(33-115) 137/33 mmHg (03/31 0910) SpO2:  [95 %-100 %] 99 % (03/31 0910) FiO2 (%):  [30 %-40 %] 30 % (03/31 0910) Weight:  [97.2 kg (214 lb 4.6 oz)] 97.2 kg (214 lb 4.6 oz) (03/31 0500) HEMODYNAMICS:   VENTILATOR SETTINGS: Vent Mode:  [-] PRVC FiO2 (%):  [30 %-40 %] 30 % Set Rate:  [18 bmp] 18 bmp Vt Set:  [420 mL] 420 mL PEEP:  [5 cmH20] 5 cmH20 Pressure Support:  [10 cmH20] 10 cmH20 Plateau Pressure:  [18 cmH20-23 cmH20] 21 cmH20 INTAKE / OUTPUT: Intake/Output      03/30 0701 - 03/31 0700 03/31 0701 - 04/01 0700   I.V. (mL/kg) 650.6 (6.7) 64.2 (0.7)   NG/GT 655    IV Piggyback 300    Total Intake(mL/kg) 1605.6 (16.5) 64.2 (0.7)   Urine (mL/kg/hr) 4080 (1.7) 500 (1.9)   Total Output 4080 500   Net -2474.4 -435.8          PHYSICAL EXAMINATION: General: acutely ill,  Eyes open. No family at  bedside. No acute distress. Integument:  Warm & dry. No rash on exposed skin. Surgical incision on neck clean & dry. HEENT:  No scleral injection. Endotracheal tube in place.  Cardiovascular:  Regular rate. No edema. No appreciable JVD.  Pulmonary:  Good aeration & clear to auscultation bilaterally. Symmetric chest wall rise on ventilator. Abdomen: Soft. Normal bowel sounds. Nondistended.  Neurological: Spontaneously moving all 4 extremities. Eyes open. Appears grossly nonfocal.  LABS:  CBC  Recent Labs Lab 03/07/16 0430 03/08/16 0430 03/09/16 0518  WBC 9.7 7.7 8.1  HGB 9.8* 10.4* 10.2*  HCT 33.4* 34.9* 35.2*  PLT 373 340 360   Coag's No results for input(s): APTT, INR in the last 168 hours. BMET  Recent Labs Lab 03/07/16 0430 03/08/16 0430 03/09/16 0518  NA 144 142 140  K 3.4* 3.0* 3.8  CL 103 100* 100*  CO2 BUN CREATININE 1.03* 0.84 0.88  GLUCOSE 105* 111* 112*   Electrolytes  Recent Labs Lab 03/07/16 0430 03/08/16 0430 03/09/16 0518  CALCIUM 8.3* 8.6* 8.8*  MG 2.1 2.0 1.7  PHOS 3.8 4.3 3.9   Sepsis Markers No results for input(s): LATICACIDVEN, PROCALCITON, O2SATVEN in the last 168 hours. ABG  Recent Labs Lab 03/02/16 1055 03/08/16 0341 03/09/16 0406  PHART 7.382 7.436 7.404  PCO2ART 48.2* 45.1* 54.6*  PO2ART 109* 86.1 71.0*   Liver Enzymes  Recent Labs Lab 03/07/16 0430 03/08/16 0430 03/09/16 0518  ALBUMIN 2.0* 2.1* 2.2*   Cardiac Enzymes No results for input(s): TROPONINI, PROBNP in the last 168 hours. Glucose  Recent Labs Lab 03/08/16 1218 03/08/16 1538 03/08/16 1930 03/08/16 2332 03/09/16 0327 03/09/16 0818  GLUCAP 104* 100* 106* 92 98 91    Imaging Dg Chest Port 1 View  03/09/2016  CLINICAL DATA:  Intubation. EXAM: PORTABLE CHEST 1 VIEW COMPARISON:  03/08/2016. FINDINGS: Endotracheal tube, NG tube, left subclavian line in stable position. Cardiomegaly with normal pulmonary vascularity. Low lung  volumes with mild bibasilar atelectasis and/or infiltrates. No interim change. Small left pleural effusion cannot be excluded . IMPRESSION: 1. Lines and tubes in stable position. 2. Low lung volumes with mild bibasilar atelectasis and/or infiltrates. No interim change. 3. Cardiomegaly.  Normal pulmonary vascularity . Electronically Signed   By: Maisie Fushomas  Register   On: 03/09/2016 07:40   Dg Chest Port 1 View  03/08/2016  CLINICAL DATA:  70 year old female with endotracheal tube in place. Respiratory failure. Hypertension. Subsequent encounter. EXAM: PORTABLE CHEST 1 VIEW COMPARISON:  03/06/2016. FINDINGS: Endotracheal tube tip 4 cm above the carina. Nasogastric tube courses below the diaphragm. Tip is not included on the present exam. Left central line tip projects at the expected junction of the left brachiocephalic vein/ superior vena cava. No gross pneumothorax. Cardiomegaly. Poor inspiration. Bibasilar subsegmental atelectasis. Difficult to exclude subtle basilar infiltrate. Pulmonary vascular prominence most notable centrally. Minimal peribronchial thickening. Calcified mildly tortuous aorta. IMPRESSION: Poor inspiration with bibasilar atelectatic changes. Cardiomegaly with central pulmonary vascular prominence. Minimal peribronchial thickening. Electronically Signed   By: Lacy DuverneySteven  Olson M.D.   On: 03/08/2016 07:28   SIGNIFICANT EVENTS: 3/17 - Admit 3/21 - Thyroidectomy 3/24 - Extubation & reintubation within 2 hours due to hypoxia & secretions  STUDIES:  CT-A of Chest >> mild mediastinal enlargement CT of the Neck with contrast >> multinodular goiter, meningioma Port CXR 3/22:  ETT & CVL in good position. Blunting bilateral costophrenic angles suggestive of effusions. Bilateral hilar fullness & interstitial prominence with low lung volumes.  Port CXR 3/25:. Silhouetting of the left hemidiaphragm. Platelike atelectasis right lower lung. Endotracheal tube in good position.  LINES / TUBES: OETT 7.5  3/17 - 3/24; 7.0 3/24>>> L Subclavian CVL 3/21>>> L Port 3/21>>> OGT 3/21 - 3/24; 3/24>>> Foley 3/18>>> PIV x1 L Radial Art Line 3/21 - 3/23 R & L Neck Drains 3/21 - ???  MICROBIOLOGY: Tracheal Asp Ctx 3/25>>> ng Blood Ctx x2 3/18:  Negative  MRSA PCR 3/18:  Negative   ANTIBIOTICS: Unasyn 3/24>>> Ancef 3/21 - 3/24 Clindamycin 3/21 (only 1 dose) Azithromycin 3/18 - 3/19 Rocephin 3/18 - 3/20  ASSESSMENT / PLAN:  PULMONARY A: Acute on Chronic Hypercarbic Respiratory Failure Acute Hypoxic Respiratory Failure - Question element of pulmonary edema vs viral bronchitis/pneumonitis. Extrinsic Airway Compression - Secondary to goiter. S/P total thyroidectomy 3/21.  P:   Trach today. [facility]. Titrate O2 for sat of 88-92%.  CARDIOVASCULAR A: HTN - TTE 3/19 nml LV fn, gr 1 DD.  P:   Hydralazine IV prn Continuing Lasix IV q12hr for diuresis  RENAL A: Hypokalemia - Replacing. Hypernatremia - Mild but stable. Hypomagnesemia - Resolved. Hyponatremia - Resolved. Acute Renal Failure - Resolved.  P:   Trending UOP with Foley while on lasix. Monitor electrolytes & renal function daily. Replete  lytes as needed. Ct free water.  GASTROINTESTINAL A: No acute issues.  P:   Protonix  daily Continue tube feedings  HEMATOLOGIC A: Anemia - Mild.  P:   Trending cell counts daily w/ CBC SCDs Continue Heparin [place] q8hr  INFECTIOUS A: CAP vs Aspiration Pneumonia  P:   Unasyn Day #8/8. Monitor for fever & leukocytosis.  ENDOCRINE A: S/P Total Thyroidectomy - Previously normal TSH.  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  FAMILY UPDATE:  No family bedside.  TODAY'S SUMMARY:  70 year old with large goiter and questionable community acquired vs  aspiration  pneumonia.  Failed extubation 3/24 . Continuing diuresis and Unasyn for now. Plan for  another trial of extubation to bipap - if fails again will certainly need trach  The patient is critically ill with multiple organ systems failure and requires high complexity decision making for assessment and support, frequent evaluation and titration of therapies, application of advanced monitoring technologies and extensive interpretation of multiple databases.   Critical Care Time devoted to patient care services described in this note is  35  Minutes. This time reflects time of care of this signee Dr Koren Bound. This critical care time does not reflect procedure time, or teaching time or supervisory time of PA/NP/Med student/Med Resident etc but could involve care discussion time.  Alyson Reedy, M.D. [facility] Pulmonary/Critical Care Medicine. Pager: [phone]. After hours pager: [phone].  03/09/2016

## 2016-03-09 NOTE — Progress Notes (Signed)
Nutrition Follow-up  DOCUMENTATION CODES:   Morbid obesity  INTERVENTION:   Vital High Protein @ 55 ml/hr   NUTRITION DIAGNOSIS:   Inadequate oral intake related to inability to eat as evidenced by NPO status. Ongoing.   GOAL:   Provide needs based on ASPEN/SCCM guidelines Met.   MONITOR:   Vent status, Labs, TF tolerance, Diet advancement, I & O's  ASSESSMENT:   70 y/o woman recently moved from [place] who presents with respiratory distress. 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  Patient is currently intubated on ventilator support MV: 7.4 L/min Temp (24hrs), Avg:98 F (36.7 C), Min:97.3 F (36.3 C), Max:98.8 F (37.1 C)  Propofol: d/c'ed   3/31 plan for trach today Free water 200 ml 4 times per day = 800 ml Labs reviewed- electrolytes WNL, cbg's: 91-98 Vital High Protein held, has only been running @ 15 ml/hr per MD order providing: 360 kcal, 31 grams protein Plan for CIR once ready for discharge from ICU.  NG tube in place  Diet Order:  Diet NPO time specified  Skin:  Wound (see comment) (closed incision on neck)  Last BM:  3/25  Height:   Ht Readings from Last 1 Encounters:  02/24/16 5' (1.524 m)    Weight:   Wt Readings from Last 1 Encounters:  03/09/16 214 lb 4.6 oz (97.2 kg)    Ideal Body Weight:  45.45 kg  BMI:  Body mass index is 41.85 kg/(m^2).  Estimated Nutritional Needs:   Kcal:  8867-7373  Protein:  >/= 110 gm  Fluid:  >/= 1.5 L  EDUCATION NEEDS:   No education needs identified at this time  [place], [place], [place] Pager [phone] After Hours Pager

## 2016-03-09 NOTE — H&P (Signed)
03/09/2016  Kendra Osborne  PREOPERATIVE HISTORY AND PHYSICAL  CHIEF COMPLAINT: acute/chronic respiratory failure  HISTORY: This is a 70 year old who presented with massive thyroid goiter and acute respiratory failure requiring intubation. Had thyroidectomy on 02/28/16 but has not been able to be extubated to needs tracheotomy. She now presents for tracheotomy.  Dr. Simeon Craft, Alroy Dust has discussed the risks, benefits, and alternatives of this procedure with her family/son. The patient's family understands the risks and would like to proceed with the procedure. The chances of success of the procedure are >50% and the patient's family understands this. I personally performed an examination of the patient within 24 hours of the procedure.  PAST MEDICAL HISTORY: Past Medical History  Diagnosis Date  . Hypertension     PAST SURGICAL HISTORY: Past Surgical History  Procedure Laterality Date  . Thyroidectomy N/A 02/28/2016    Procedure:  TOTAL THYROIDECTOMY WITH NIMS MONITOR;  Surgeon: Kendra Cola, MD;  Location: [facility] OR;  Service: ENT;  Laterality: N/A;    MEDICATIONS: Scheduled Meds: . ampicillin-sulbactam (UNASYN) IV  3 g Intravenous Q6H  . antiseptic oral rinse  7 mL Mouth Rinse 10 times per day  . bacitracin   Topical TID  . chlorhexidine gluconate (SAGE KIT)  15 mL Mouth Rinse BID  . free water  200 mL Per Tube 4 times per day  . furosemide  40 mg Intravenous Q12H  . heparin subcutaneous  5,000 Units Subcutaneous 3 times per day  . levothyroxine  100 mcg Oral QAC breakfast  . pantoprazole sodium  40 mg Per Tube Daily   Continuous Infusions: . dexmedetomidine 0.601 mcg/kg/hr (03/09/16 0600)  . feeding supplement (VITAL HIGH PROTEIN) Stopped (03/09/16 0000)  . fentaNYL infusion INTRAVENOUS 175 mcg/hr (03/09/16 0600)   PRN Meds:.sodium chloride, fentaNYL (SUBLIMAZE) injection, fentaNYL (SUBLIMAZE) injection, hydrALAZINE, iohexol, iohexol, iohexol, midazolam,  ondansetron  ALLERGIES: Not on File    SOCIAL HISTORY: Social History   Social History  . Marital Status: Widowed    Spouse Name: N/A  . Number of Children: N/A  . Years of Education: N/A   Occupational History  . Not on file.   Social History Main Topics  . Smoking status: Never Smoker   . Smokeless tobacco: Not on file  . Alcohol Use: No  . Drug Use: No  . Sexual Activity: Not on file   Other Topics Concern  . Not on file   Social History Narrative    FAMILY HISTORY: History reviewed. No pertinent family history.  REVIEW OF SYSTEMS:  Unable to obtain x 12 as patient is intubated   PHYSICAL EXAM:  GENERAL:  Intubated/sedated VITAL SIGNS:   Filed Vitals:   03/09/16 0600 03/09/16 0704  BP: 178/70   Pulse: 55 63  Temp:    Resp: 18 18    SKIN:  Warm, dry HEENT:  ETtube in oral cavity along with Og tube NECK:  Thyroidectomy incision is clean, dry and intact ABDOMEN:  soft MUSCULOSKELETAL: grossly normal PSYCH:  Intubated/sedated NEUROLOGIC:  Sedated but awakens and moves extremities  DIAGNOSTIC STUDIES:  calciums have been trending upward and WNL (corrected)  ASSESSMENT AND PLAN: Plan to proceed with tracheotomy. Patient's family understands the risks, benefits, and alternatives. Informed written consent signed and witnessed (obtained from patient's son) and on chart. 03/09/2016  7:05 AM Kendra Osborne

## 2016-03-09 NOTE — Progress Notes (Signed)
eLink Physician-Brief Progress Note Patient Name: Juliane Pootaseem Pask DOB: 03/11/1946 MRN: [id number]   Date of Service  03/09/2016  HPI/Events of Note  2 episodes of vomiting  eICU Interventions  PRN zofran ordered     Intervention Category Minor Interventions: Routine modifications to care plan (e.g. PRN medications for pain, fever)  Jeri Rawlins 03/09/2016, 12:58 AM

## 2016-03-09 NOTE — Anesthesia Postprocedure Evaluation (Signed)
Anesthesia Post Note  Patient: Kendra PootNaseem Osborne  Procedure(s) Performed: Procedure(s) (LRB): TRACHEOSTOMY (N/A)  Patient location during evaluation: SICU Anesthesia Type: General Level of consciousness: sedated Pain management: pain level controlled Vital Signs Assessment: post-procedure vital signs reviewed and stable Respiratory status: patient on ventilator - see flowsheet for VS Cardiovascular status: stable Anesthetic complications: no    Last Vitals:  Filed Vitals:   03/09/16 0900 03/09/16 0910  BP: 187/86 137/33  Pulse: 51 56  Temp:    Resp: 17 20    Last Pain:  Filed Vitals:   03/09/16 0912  PainSc: 5                  Reino KentJudd, Sammantha Mehlhaff J

## 2016-03-09 NOTE — Anesthesia Preprocedure Evaluation (Addendum)
Anesthesia Evaluation  Patient identified by MRN, date of birth, ID band Patient unresponsive    Reviewed: Allergy & Precautions, H&P , NPO status , Patient's Chart, lab work & pertinent test results  Airway Mallampati: Intubated       Dental no notable dental hx. (+) Poor Dentition, Loose, Missing, Chipped,    Pulmonary  VDRF      + intubated    Cardiovascular hypertension, Pt. on medications  Rhythm:Regular Rate:Normal  Echo with normal EF   Neuro/Psych negative neurological ROS  negative psych ROS   GI/Hepatic negative GI ROS, Neg liver ROS,   Endo/Other  Morbid obesityGoiter  Renal/GU negative Renal ROS  negative genitourinary   Musculoskeletal   Abdominal (+) + obese,   Peds  Hematology negative hematology ROS (+)   Anesthesia Other Findings   Reproductive/Obstetrics negative OB ROS                            Anesthesia Physical  Anesthesia Plan  ASA: IV  Anesthesia Plan: General   Post-op Pain Management:    Induction: Intravenous  Airway Management Planned: Oral ETT  Additional Equipment:   Intra-op Plan:   Post-operative Plan: Post-operative intubation/ventilation  Informed Consent: I have reviewed the patients History and Physical, chart, labs and discussed the procedure including the risks, benefits and alternatives for the proposed anesthesia with the patient or authorized representative who has indicated his/her understanding and acceptance.   Dental advisory given  Plan Discussed with: CRNA  Anesthesia Plan Comments:         Anesthesia Quick Evaluation

## 2016-03-09 NOTE — Progress Notes (Signed)
OT Cancellation Note  Patient Details Name: Juliane Pootaseem Scheel MRN: [id number] DOB: 03/29/1946   Cancelled Treatment:    Reason Eval/Treat Not Completed: Patient at procedure or test/ unavailable  Jeani HawkingWendi Kriston Pasquarello, OTR/L [phone]  Jeani HawkingConarpe, Lindsea Olivar M 03/09/2016, 10:27 AM

## 2016-03-10 DIAGNOSIS — R451 Restlessness and agitation: Secondary | ICD-10-CM

## 2016-03-10 LAB — CBC WITH DIFFERENTIAL/PLATELET
Basophils Absolute: 0 10*3/uL (ref 0.0–0.1)
Basophils Relative: 0 %
Eosinophils Absolute: 0.4 10*3/uL (ref 0.0–0.7)
Eosinophils Relative: 3 %
HEMATOCRIT: 38 % (ref 36.0–46.0)
HEMOGLOBIN: 11.6 g/dL — AB (ref 12.0–15.0)
LYMPHS ABS: 3 10*3/uL (ref 0.7–4.0)
Lymphocytes Relative: 23 %
MCH: 25.8 pg — ABNORMAL LOW (ref 26.0–34.0)
MCHC: 30.5 g/dL (ref 30.0–36.0)
MCV: 84.4 fL (ref 78.0–100.0)
MONO ABS: 1 10*3/uL (ref 0.1–1.0)
MONOS PCT: 8 %
NEUTROS ABS: 8.8 10*3/uL — AB (ref 1.7–7.7)
NEUTROS PCT: 66 %
Platelets: 418 10*3/uL — ABNORMAL HIGH (ref 150–400)
RBC: 4.5 MIL/uL (ref 3.87–5.11)
RDW: 17.6 % — ABNORMAL HIGH (ref 11.5–15.5)
WBC: 13.2 10*3/uL — ABNORMAL HIGH (ref 4.0–10.5)

## 2016-03-10 LAB — BASIC METABOLIC PANEL
Anion gap: 12 (ref 5–15)
BUN: 17 mg/dL (ref 6–20)
CHLORIDE: 98 mmol/L — AB (ref 101–111)
CO2: 29 mmol/L (ref 22–32)
Calcium: 9 mg/dL (ref 8.9–10.3)
Creatinine, Ser: 0.97 mg/dL (ref 0.44–1.00)
GFR calc Af Amer: >60 mL/min (ref >60)
GFR calc non Af Amer: 59 mL/min — ABNORMAL LOW (ref >60)
GLUCOSE: 129 mg/dL — AB (ref 65–99)
POTASSIUM: 3.3 mmol/L — AB (ref 3.5–5.1)
Sodium: 139 mmol/L (ref 135–145)

## 2016-03-10 LAB — RENAL FUNCTION PANEL
ANION GAP: 13 (ref 5–15)
Albumin: 2.5 g/dL — ABNORMAL LOW (ref 3.5–5.0)
BUN: 17 mg/dL (ref 6–20)
CHLORIDE: 96 mmol/L — AB (ref 101–111)
CO2: 29 mmol/L (ref 22–32)
Calcium: 8.9 mg/dL (ref 8.9–10.3)
Creatinine, Ser: 0.97 mg/dL (ref 0.44–1.00)
GFR calc non Af Amer: 59 mL/min — ABNORMAL LOW (ref >60)
GLUCOSE: 129 mg/dL — AB (ref 65–99)
Phosphorus: 4.2 mg/dL (ref 2.5–4.6)
Potassium: 3.2 mmol/L — ABNORMAL LOW (ref 3.5–5.1)
Sodium: 138 mmol/L (ref 135–145)

## 2016-03-10 LAB — MAGNESIUM: Magnesium: 1.6 mg/dL — ABNORMAL LOW (ref 1.7–2.4)

## 2016-03-10 LAB — PHOSPHORUS: PHOSPHORUS: 4.3 mg/dL (ref 2.5–4.6)

## 2016-03-10 LAB — GLUCOSE, CAPILLARY
GLUCOSE-CAPILLARY: 161 mg/dL — AB (ref 65–99)
GLUCOSE-CAPILLARY: 132 mg/dL — AB (ref 65–99)
Glucose-Capillary: 130 mg/dL — ABNORMAL HIGH (ref 65–99)
Glucose-Capillary: 131 mg/dL — ABNORMAL HIGH (ref 65–99)
Glucose-Capillary: 86 mg/dL (ref 65–99)

## 2016-03-10 MED ORDER — QUETIAPINE FUMARATE 25 MG PO TABS
25.0000 mg | ORAL_TABLET | Freq: Two times a day (BID) | ORAL | Status: DC
  Administered 2016-03-10 – 2016-03-13 (×6): 25 mg via ORAL
  Filled 2016-03-10 (×10): qty 1

## 2016-03-10 MED ORDER — WHITE PETROLATUM GEL
Status: AC
  Administered 2016-03-10: 0.2
  Filled 2016-03-10: qty 1

## 2016-03-10 MED ORDER — CLONAZEPAM 0.5 MG PO TBDP
1.0000 mg | ORAL_TABLET | Freq: Two times a day (BID) | ORAL | Status: DC
  Administered 2016-03-10 – 2016-03-13 (×7): 1 mg
  Filled 2016-03-10 (×8): qty 2

## 2016-03-10 MED ORDER — CLONAZEPAM 0.1 MG/ML ORAL SUSPENSION: 1.0000 mg | Freq: Two times a day (BID) | ORAL | Status: DC

## 2016-03-10 MED ORDER — MAGNESIUM SULFATE 2 GM/50ML IV SOLN
2.0000 g | Freq: Once | INTRAVENOUS | Status: AC
  Administered 2016-03-10: 2 g via INTRAVENOUS
  Filled 2016-03-10: qty 50

## 2016-03-10 MED ORDER — POTASSIUM CHLORIDE 20 MEQ/15ML (10%) PO SOLN
40.0000 meq | Freq: Three times a day (TID) | ORAL | Status: AC
  Administered 2016-03-10 (×2): 40 meq
  Filled 2016-03-10 (×2): qty 30

## 2016-03-10 MED ORDER — POTASSIUM CHLORIDE 20 MEQ/15ML (10%) PO SOLN
20.0000 meq | ORAL | Status: DC
  Administered 2016-03-10: 20 meq
  Filled 2016-03-10: qty 15

## 2016-03-10 NOTE — Progress Notes (Signed)
Pt placed back on full vent support at this time due to decreased pt effort, VT <300, pt resting comfortably on full support, RT will continue to monitor

## 2016-03-10 NOTE — Progress Notes (Signed)
[facility] FOR [facility] LAB REPLACEMENT ONLY  The patient does apply for the [facility] Electrolyte [facility] Protocol based on the criteria listed below:   1. Is GFR >/= 40 ml/min? Yes.    Patient's GFR today is >60 2. Is urine output >/= 0.5 ml/kg/hr for the last 6 hours? Yes.   Patient's UOP is 1.25 ml/kg/hr 3. Is BUN < 60 mg/dL? Yes.    Patient's BUN today is 17 4. Abnormal electrolyte(s):   K - 3.3 5. Ordered repletion with: PER PROTOCOL 6. If a panic level lab has been reported, has the CCM MD in charge been notified? Yes.  .   Physician:  Dr. Lorre Munroeeterding  Kameron Blethen C Kolette Vey 03/10/2016 6:32 AM
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
## 2016-03-10 NOTE — Progress Notes (Signed)
Pt placed back on vent at this time due to inc WOB on ATC, inc PS to 12 for VT >300, pt tolerating PS 12/5 well, RT will monitor

## 2016-03-11 LAB — GLUCOSE, CAPILLARY
GLUCOSE-CAPILLARY: 136 mg/dL — AB (ref 65–99)
GLUCOSE-CAPILLARY: 133 mg/dL — AB (ref 65–99)
GLUCOSE-CAPILLARY: 111 mg/dL — AB (ref 65–99)
Glucose-Capillary: 97 mg/dL (ref 65–99)
Glucose-Capillary: 116 mg/dL — ABNORMAL HIGH (ref 65–99)
Glucose-Capillary: 139 mg/dL — ABNORMAL HIGH (ref 65–99)
Glucose-Capillary: 130 mg/dL — ABNORMAL HIGH (ref 65–99)

## 2016-03-11 LAB — BASIC METABOLIC PANEL
Anion gap: 11 (ref 5–15)
BUN: 22 mg/dL — AB (ref 6–20)
CALCIUM: 8.4 mg/dL — AB (ref 8.9–10.3)
CHLORIDE: 98 mmol/L — AB (ref 101–111)
CO2: 31 mmol/L (ref 22–32)
CREATININE: 1.03 mg/dL — AB (ref 0.44–1.00)
GFR, EST NON AFRICAN AMERICAN: 55 mL/min — AB (ref >60)
Glucose, Bld: 130 mg/dL — ABNORMAL HIGH (ref 65–99)
Potassium: 3.8 mmol/L (ref 3.5–5.1)
SODIUM: 140 mmol/L (ref 135–145)

## 2016-03-11 LAB — CBC WITH DIFFERENTIAL/PLATELET
BASOS ABS: 0 10*3/uL (ref 0.0–0.1)
BASOS PCT: 0 %
EOS ABS: 0.2 10*3/uL (ref 0.0–0.7)
Eosinophils Relative: 2 %
HEMATOCRIT: 35.2 % — AB (ref 36.0–46.0)
Hemoglobin: 10.6 g/dL — ABNORMAL LOW (ref 12.0–15.0)
Lymphocytes Relative: 30 %
Lymphs Abs: 3.3 10*3/uL (ref 0.7–4.0)
MCH: 25.5 pg — ABNORMAL LOW (ref 26.0–34.0)
MCHC: 30.1 g/dL (ref 30.0–36.0)
MCV: 84.6 fL (ref 78.0–100.0)
MONO ABS: 0.7 10*3/uL (ref 0.1–1.0)
Monocytes Relative: 6 %
NEUTROS ABS: 7 10*3/uL (ref 1.7–7.7)
NEUTROS PCT: 62 %
Platelets: 383 10*3/uL (ref 150–400)
RBC: 4.16 MIL/uL (ref 3.87–5.11)
RDW: 18 % — AB (ref 11.5–15.5)
WBC: 11.2 10*3/uL — ABNORMAL HIGH (ref 4.0–10.5)

## 2016-03-11 LAB — MAGNESIUM: MAGNESIUM: 1.9 mg/dL (ref 1.7–2.4)

## 2016-03-11 LAB — PHOSPHORUS: Phosphorus: 3.7 mg/dL (ref 2.5–4.6)

## 2016-03-11 MED ORDER — FENTANYL 50 MCG/HR TD PT72
75.0000 ug | MEDICATED_PATCH | TRANSDERMAL | Status: DC
  Administered 2016-03-11 – 2016-03-20 (×4): 75 ug via TRANSDERMAL
  Filled 2016-03-11 (×4): qty 1

## 2016-03-11 NOTE — Progress Notes (Signed)
PULMONARY / CRITICAL CARE MEDICINE    Name: Kendra Osborne MRN: [id number] DOB: 12/20/1945    ADMISSION DATE:  02/24/2016  PRIMARY SERVICE: PCCM  CHIEF COMPLAINT:  Dyspnea  BRIEF PATIENT DESCRIPTION: 70 y/o woman recently moved from [name] who presents with respiratory distress. She presented to the ED on 3/17 with complaints of dyspnea, and lower extremity edema. History is limited due to language / culture barriers, but she has a PMHx of a known goiter that has been present for years, as well as possibly hypertension. It is not clear if the goiter was ever worked up or if she ever was on regular therapy for her hypertension. Her Nephew and son (who speaks little [place]) report that she been having a cough for a few days as well as some fevers and chills.  SUBJECTIVE:    Agitated again overnight.  VITAL SIGNS: Temp:  [97.3 F (36.3 C)-98.8 F (37.1 C)] 98.7 F (37.1 C) (04/02 0400) Pulse Rate:  [56-136] 118 (04/02 0822) Resp:  [17-22] 22 (04/02 0822) BP: (83-179)/(45-140) 124/78 mmHg (04/02 0813) SpO2:  [92 %-100 %] 97 % (04/02 0822) FiO2 (%):  [30 %-50 %] 50 % (04/02 0822) Weight:  [97.9 kg (215 lb 13.3 oz)] 97.9 kg (215 lb 13.3 oz) (04/02 0500) HEMODYNAMICS:   VENTILATOR SETTINGS: Vent Mode:  [-] PRVC FiO2 (%):  [30 %-50 %] 50 % Set Rate:  [18 bmp] 18 bmp Vt Set:  [420 mL] 420 mL PEEP:  [5 cmH20] 5 cmH20 Pressure Support:  [12 cmH20] 12 cmH20 Plateau Pressure:  [17 cmH20-20 cmH20] 18 cmH20  INTAKE / OUTPUT: Intake/Output      04/01 0701 - 04/02 0700 04/02 0701 - 04/03 0700   I.V. (mL/kg) 1094.6 (11.2) 52.3 (0.5)   NG/GT 1720 55   IV Piggyback 400    Total Intake(mL/kg) 3214.6 (32.8) 107.3 (1.1)   Urine (mL/kg/hr) 2420 (1) 175 (1.2)   Blood     Total Output 2420 175   Net +794.6 -67.7         PHYSICAL EXAMINATION: General: acutely ill, eyes open. No family at bedside. No acute distress.  Trach in place.   Integument:  Warm & dry. No rash on exposed skin.  Surgical incision on neck clean & dry. HEENT:  No scleral injection. Endotracheal tube in place.  Cardiovascular:  Regular rate. No edema. No appreciable JVD.  Pulmonary:  Good aeration & clear to auscultation bilaterally. Symmetric chest wall rise on ventilator. Abdomen: Soft. Normal bowel sounds. Nondistended.  Neurological: Spontaneously moving all 4 extremities. Eyes open. Appears grossly nonfocal.  LABS:  CBC  Recent Labs Lab 03/09/16 0518 03/10/16 0525 03/11/16 0640  WBC 8.1 13.2* 11.2*  HGB 10.2* 11.6* 10.6*  HCT 35.2* 38.0 35.2*  PLT 360 418* 383   Coag's No results for input(s): APTT, INR in the last 168 hours. BMET  Recent Labs Lab 03/09/16 0518 03/10/16 0525 03/11/16 0640  NA 140 139  138 140  K 3.8 3.3*  3.2* 3.8  CL 100* 98*  96* 98*  CO2 29 29  29 31   BUN 18 17  17  22*  CREATININE 0.88 0.97  0.97 1.03*  GLUCOSE 112* 129*  129* 130*   Electrolytes  Recent Labs Lab 03/09/16 0518 03/10/16 0525 03/11/16 0640  CALCIUM 8.8* 9.0  8.9 8.4*  MG 1.7 1.6* 1.9  PHOS 3.9 4.3  4.2 3.7   Sepsis Markers No results for input(s): LATICACIDVEN, PROCALCITON, O2SATVEN in the last 168 hours. ABG  Recent Labs Lab 03/08/16 0341 03/09/16 0406  PHART 7.436 7.404  PCO2ART 45.1* 54.6*  PO2ART 86.1 71.0*   Liver Enzymes  Recent Labs Lab 03/08/16 0430 03/09/16 0518 03/10/16 0525  ALBUMIN 2.1* 2.2* 2.5*   Cardiac Enzymes No results for input(s): TROPONINI, PROBNP in the last 168 hours. Glucose  Recent Labs Lab 03/10/16 1126 03/10/16 1547 03/10/16 1928 03/10/16 2316 03/11/16 0328 03/11/16 0810  GLUCAP 132* 131* 136* 97 116* 133*   Imaging Dg Chest Port 1 View  03/09/2016  CLINICAL DATA:  Tracheostomy tube insertion EXAM: PORTABLE CHEST 1 VIEW COMPARISON:  600 hours FINDINGS: Endotracheal tube, NG tube, left subclavian central venous catheter stable. Bibasilar atelectasis versus airspace disease is stable left greater than right. No  pneumothorax. Normal vascularity. IMPRESSION: Stable bibasilar atelectasis. Electronically Signed   By: Jolaine Click M.D.   On: 03/09/2016 11:21   Dg Abd Portable 1v  03/09/2016  CLINICAL DATA:  Nasogastric tube placement. Acute respiratory failure with hypoxia. EXAM: PORTABLE ABDOMEN - 1 VIEW COMPARISON:  None. FINDINGS: A nasogastric tube is seen with tip overlying the gastric antrum. No evidence of dilated bowel loops. IMPRESSION: Nasogastric tube tip overlies the gastric antrum. Electronically Signed   By: Myles Rosenthal M.D.   On: 03/09/2016 11:55   SIGNIFICANT EVENTS: 3/17 - Admit 3/21 - Thyroidectomy 3/24 - Extubation & reintubation within 2 hours due to hypoxia & secretions  STUDIES:  CT-A of Chest >> mild mediastinal enlargement CT of the Neck with contrast >> multinodular goiter, meningioma Port CXR 3/22:  ETT & CVL in good position. Blunting bilateral costophrenic angles suggestive of effusions. Bilateral hilar fullness & interstitial prominence with low lung volumes.  Port CXR 3/25:. Silhouetting of the left hemidiaphragm. Platelike atelectasis right lower lung. Endotracheal tube in good position.  LINES / TUBES: OETT 7.5 3/17 - 3/24; 7.0 3/24>>>3/31 Trach (ENT) 3/31>>> L Subclavian CVL 3/21>>> L Port 3/21>>> OGT 3/21 - 3/24; 3/24>>> Foley 3/18>>> PIV x1 L Radial Art Line 3/21 - 3/23 R & L Neck Drains 3/21 - ???  MICROBIOLOGY: Tracheal Asp Ctx 3/25>>> ng Blood Ctx x2 3/18:  Negative  MRSA PCR 3/18:  Negative   ANTIBIOTICS: Unasyn 3/24>>> Ancef 3/21 - 3/24 Clindamycin 3/21 (only 1 dose) Azithromycin 3/18 - 3/19 Rocephin 3/18 - 3/20  ASSESSMENT / PLAN:  PULMONARY A: Acute on Chronic Hypercarbic Respiratory Failure Acute Hypoxic Respiratory Failure - Question element of pulmonary edema vs viral bronchitis/pneumonitis. Extrinsic Airway Compression - Secondary to goiter. S/P total thyroidectomy 3/21.  P:   Trach in place. Hope to advance to [facility] as able only  tolerated for 2 hours yesterday. Titrate O2 for sat of 88-92%.  CARDIOVASCULAR A: HTN - TTE 3/19 nml LV fn, gr 1 DD.  P:   Hydralazine IV prn Continuing Lasix IV q12hr for diuresis  RENAL A: Hypokalemia - Replacing. Hypernatremia - Mild but stable. Hypomagnesemia - Resolved. Hyponatremia - Resolved. Acute Renal Failure - Resolved.  P:   Trending UOP with Foley while on lasix. Monitor electrolytes & renal function daily. Replete lytes as needed. Cont free water.  GASTROINTESTINAL A: No acute issues.  P:   Protonix  daily Continue tube feedings  HEMATOLOGIC A: Anemia - Mild.  P:   Trending cell counts daily w/ CBC SCDs Continue Heparin [place] q8hr  INFECTIOUS A: CAP vs Aspiration Pneumonia  P:   Unasyn Day #8/8 3/31. Monitor for fever & leukocytosis.  ENDOCRINE A: S/P Total Thyroidectomy - Previously normal TSH.  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 Klonipin 1 mg PO BID. Seroquel 25 mg BID. Start fentanyl patch and attempt to decrease drip.  FAMILY UPDATE:  No family bedside.  TODAY'S SUMMARY:  70 year old with large goiter and questionable community acquired vs aspiration  pneumonia.  Failed extubation 3/24 . Trach in place.  Seroquel and Clonazepam. Advance weaning as able.  The patient is critically ill with multiple organ systems failure and requires high complexity decision making for assessment and support, frequent evaluation and titration of therapies, application of advanced monitoring technologies and extensive interpretation of multiple databases.   Critical Care Time devoted to patient care services described in this note is  35  Minutes. This time reflects time of care of this signee Dr Koren Bound. This critical care time does not reflect procedure time, or  teaching time or supervisory time of PA/NP/Med student/Med Resident etc but could involve care discussion time.  Alyson Reedy, M.D. [facility] Pulmonary/Critical Care Medicine. Pager: [phone]. After hours pager: [phone].  03/11/2016

## 2016-03-12 ENCOUNTER — Encounter (HOSPITAL_COMMUNITY): Payer: Self-pay | Admitting: Otolaryngology

## 2016-03-12 DIAGNOSIS — R1310 Dysphagia, unspecified: Secondary | ICD-10-CM

## 2016-03-12 LAB — CBC WITH DIFFERENTIAL/PLATELET
Basophils Absolute: 0 10*3/uL (ref 0.0–0.1)
Basophils Relative: 0 %
EOS PCT: 2 %
Eosinophils Absolute: 0.2 10*3/uL (ref 0.0–0.7)
HEMATOCRIT: 32.5 % — AB (ref 36.0–46.0)
Hemoglobin: 9.6 g/dL — ABNORMAL LOW (ref 12.0–15.0)
LYMPHS ABS: 2.4 10*3/uL (ref 0.7–4.0)
LYMPHS PCT: 22 %
MCH: 25.2 pg — AB (ref 26.0–34.0)
MCHC: 29.5 g/dL — AB (ref 30.0–36.0)
MCV: 85.3 fL (ref 78.0–100.0)
MONO ABS: 0.8 10*3/uL (ref 0.1–1.0)
MONOS PCT: 8 %
NEUTROS ABS: 7.1 10*3/uL (ref 1.7–7.7)
Neutrophils Relative %: 68 %
PLATELETS: 362 10*3/uL (ref 150–400)
RBC: 3.81 MIL/uL — ABNORMAL LOW (ref 3.87–5.11)
RDW: 18.5 % — AB (ref 11.5–15.5)
WBC: 10.5 10*3/uL (ref 4.0–10.5)

## 2016-03-12 LAB — BASIC METABOLIC PANEL
Anion gap: 11 (ref 5–15)
BUN: 33 mg/dL — ABNORMAL HIGH (ref 6–20)
CALCIUM: 7.9 mg/dL — AB (ref 8.9–10.3)
CHLORIDE: 96 mmol/L — AB (ref 101–111)
CO2: 32 mmol/L (ref 22–32)
CREATININE: 1.05 mg/dL — AB (ref 0.44–1.00)
GFR calc Af Amer: >60 mL/min (ref >60)
GFR calc non Af Amer: 53 mL/min — ABNORMAL LOW (ref >60)
GLUCOSE: 123 mg/dL — AB (ref 65–99)
Potassium: 3.3 mmol/L — ABNORMAL LOW (ref 3.5–5.1)
Sodium: 139 mmol/L (ref 135–145)

## 2016-03-12 LAB — GLUCOSE, CAPILLARY
GLUCOSE-CAPILLARY: 139 mg/dL — AB (ref 65–99)
Glucose-Capillary: 118 mg/dL — ABNORMAL HIGH (ref 65–99)
Glucose-Capillary: 118 mg/dL — ABNORMAL HIGH (ref 65–99)
Glucose-Capillary: 119 mg/dL — ABNORMAL HIGH (ref 65–99)
Glucose-Capillary: 139 mg/dL — ABNORMAL HIGH (ref 65–99)
Glucose-Capillary: 142 mg/dL — ABNORMAL HIGH (ref 65–99)

## 2016-03-12 LAB — ALBUMIN: Albumin: 2.2 g/dL — ABNORMAL LOW (ref 3.5–5.0)

## 2016-03-12 LAB — PHOSPHORUS: Phosphorus: 3.6 mg/dL (ref 2.5–4.6)

## 2016-03-12 LAB — MAGNESIUM: Magnesium: 1.7 mg/dL (ref 1.7–2.4)

## 2016-03-12 MED ORDER — CHLORHEXIDINE GLUCONATE 0.12 % MT SOLN
15.0000 mL | Freq: Two times a day (BID) | OROMUCOSAL | Status: DC
  Administered 2016-03-12 – 2016-04-09 (×56): 15 mL via OROMUCOSAL
  Filled 2016-03-12 (×34): qty 15

## 2016-03-12 MED ORDER — POTASSIUM CHLORIDE 20 MEQ/15ML (10%) PO SOLN
40.0000 meq | Freq: Once | ORAL | Status: AC
  Administered 2016-03-12: 40 meq via ORAL
  Filled 2016-03-12: qty 30

## 2016-03-12 MED ORDER — CETYLPYRIDINIUM CHLORIDE 0.05 % MT LIQD
7.0000 mL | Freq: Two times a day (BID) | OROMUCOSAL | Status: DC
  Administered 2016-03-12 – 2016-03-18 (×13): 7 mL via OROMUCOSAL

## 2016-03-12 MED ORDER — PNEUMOCOCCAL VAC POLYVALENT 25 MCG/0.5ML IJ INJ: 0.5000 mL | INJECTION | INTRAMUSCULAR | Status: DC | PRN

## 2016-03-12 NOTE — Progress Notes (Signed)
PT Cancellation Note  Patient Details Name: Kendra Osborne MRN: [id number] DOB: 06/04/1946   Cancelled Treatment:    Reason Eval/Treat Not Completed: Medical issues which prohibited therapy.  Pt with increased RR and work of breathing while on [employer] Collar.  Will hold PT and mobility at this time.     Taron Conrey, Alison MurrayMegan F 03/12/2016, 1:17 PM

## 2016-03-12 NOTE — Progress Notes (Signed)
Pt placed back on full vent support at this time due to increased WOB, inc RR, pt tolerating full support well, RT will monitor

## 2016-03-12 NOTE — Progress Notes (Signed)
PULMONARY / CRITICAL CARE MEDICINE    Name: Kendra Osborne MRN: [id number] DOB: 04/25/1946    ADMISSION DATE:  02/24/2016  PRIMARY SERVICE: PCCM  CHIEF COMPLAINT:  Dyspnea  BRIEF PATIENT DESCRIPTION: 70 y/o woman recently moved from [name] who presents with respiratory distress. She presented to the ED on 3/17 with complaints of dyspnea, and lower extremity edema. History is limited due to language / culture barriers, but she has a PMHx of a known goiter that has been present for years, as well as possibly hypertension. It is not clear if the goiter was ever worked up or if she ever was on regular therapy for her hypertension. Her Nephew and son (who speaks little [place]) report that she been having a cough for a few days as well as some fevers and chills.  SUBJECTIVE:    Agitated again overnight.  VITAL SIGNS: Temp:  [98.3 F (36.8 C)-100.6 F (38.1 C)] 98.3 F (36.8 C) (04/03 0800) Pulse Rate:  [72-116] 94 (04/03 0823) Resp:  [17-43] 22 (04/03 0823) BP: (73-170)/(42-102) 158/80 mmHg (04/03 0823) SpO2:  [87 %-100 %] 97 % (04/03 0823) FiO2 (%):  [30 %-50 %] 50 % (04/03 0823) Weight:  [97 kg (213 lb 13.5 oz)] 97 kg (213 lb 13.5 oz) (04/03 0500) HEMODYNAMICS:   VENTILATOR SETTINGS: Vent Mode:  [-] PRVC FiO2 (%):  [30 %-50 %] 50 % Set Rate:  [18 bmp] 18 bmp Vt Set:  [420 mL] 420 mL PEEP:  [5 cmH20] 5 cmH20 Plateau Pressure:  [16 cmH20-17 cmH20] 17 cmH20  INTAKE / OUTPUT: Intake/Output      04/02 0701 - 04/03 0700 04/03 0701 - 04/04 0700   I.V. (mL/kg) 385 (4) 10 (0.1)   NG/GT 2120 55   IV Piggyback 400 100   Total Intake(mL/kg) 2905 (29.9) 165 (1.7)   Urine (mL/kg/hr) 1820 (0.8) 300 (1.4)   Total Output 1820 300   Net +1085 -135         PHYSICAL EXAMINATION: General: acutely ill, eyes open. No family at bedside. No acute distress.  Trach in place.   Integument:  Warm & dry. No rash on exposed skin. Surgical incision on neck clean & dry. HEENT:  No scleral injection.  Endotracheal tube in place.  Cardiovascular:  Regular rate. No edema. No appreciable JVD.  Pulmonary:  Good aeration & clear to auscultation bilaterally. Symmetric chest wall rise on ventilator. Abdomen: Soft. Normal bowel sounds. Nondistended.  Neurological: Spontaneously moving all 4 extremities. Eyes open. Appears grossly nonfocal.  LABS:  CBC  Recent Labs Lab 03/10/16 0525 03/11/16 0640 03/12/16 0555  WBC 13.2* 11.2* 10.5  HGB 11.6* 10.6* 9.6*  HCT 38.0 35.2* 32.5*  PLT 418* 383 362   Coag's No results for input(s): APTT, INR in the last 168 hours. BMET  Recent Labs Lab 03/10/16 0525 03/11/16 0640 03/12/16 0555  NA 139  138 140 139  K 3.3*  3.2* 3.8 3.3*  CL 98*  96* 98* 96*  CO2 29  29 31  32  BUN 17  17 22* 33*  CREATININE 0.97  0.97 1.03* 1.05*  GLUCOSE 129*  129* 130* 123*   Electrolytes  Recent Labs Lab 03/10/16 0525 03/11/16 0640 03/12/16 0555  CALCIUM 9.0  8.9 8.4* 7.9*  MG 1.6* 1.9 1.7  PHOS 4.3  4.2 3.7 3.6   Sepsis Markers No results for input(s): LATICACIDVEN, PROCALCITON, O2SATVEN in the last 168 hours. ABG  Recent Labs Lab 03/08/16 0341 03/09/16 0406  PHART 7.436 7.404  PCO2ART 45.1* 54.6*  PO2ART 86.1 71.0*   Liver Enzymes  Recent Labs Lab 03/08/16 0430 03/09/16 0518 03/10/16 0525  ALBUMIN 2.1* 2.2* 2.5*   Cardiac Enzymes No results for input(s): TROPONINI, PROBNP in the last 168 hours. Glucose  Recent Labs Lab 03/11/16 1133 03/11/16 1609 03/11/16 2018 03/12/16 0010 03/12/16 0344 03/12/16 0836  GLUCAP 139* 130* 111* 118* 118* 119*   Imaging No results found. SIGNIFICANT EVENTS: 3/17 - Admit 3/21 - Thyroidectomy 3/24 - Extubation & reintubation within 2 hours due to hypoxia & secretions  STUDIES:  CT-A of Chest >> mild mediastinal enlargement CT of the Neck with contrast >> multinodular goiter, meningioma Port CXR 3/22:  ETT & CVL in good position. Blunting bilateral costophrenic angles suggestive of  effusions. Bilateral hilar fullness & interstitial prominence with low lung volumes.  Port CXR 3/25:. Silhouetting of the left hemidiaphragm. Platelike atelectasis right lower lung. Endotracheal tube in good position.  LINES / TUBES: OETT 7.5 3/17 - 3/24; 7.0 3/24>>>3/31 Trach (ENT) 3/31>>> L Subclavian CVL 3/21>>> L Port 3/21>>> OGT 3/21 - 3/24; 3/24>>> Foley 3/18>>> PIV x1 L Radial Art Line 3/21 - 3/23 R & L Neck Drains 3/21 - ???  MICROBIOLOGY: Tracheal Asp Ctx 3/25>>> ng Blood Ctx x2 3/18:  Negative  MRSA PCR 3/18:  Negative   ANTIBIOTICS: Unasyn 3/24>>> Ancef 3/21 - 3/24 Clindamycin 3/21 (only 1 dose) Azithromycin 3/18 - 3/19 Rocephin 3/18 - 3/20  ASSESSMENT / PLAN:  PULMONARY A: Acute on Chronic Hypercarbic Respiratory Failure Acute Hypoxic Respiratory Failure - Question element of pulmonary edema vs viral bronchitis/pneumonitis. Extrinsic Airway Compression - Secondary to goiter. S/P total thyroidectomy 3/21.  P:   Trach in place. Maintain at [facility] as tolerated. Titrate O2 for sat of 88-92%.  CARDIOVASCULAR A: HTN - TTE 3/19 nml LV fn, gr 1 DD.  P:   Hydralazine IV prn Continuing Lasix IV q12hr for diuresis  RENAL A: Hypokalemia - Replacing. Hypernatremia - Mild but stable. Hypomagnesemia - Resolved. Hyponatremia - Resolved. Acute Renal Failure - Resolved.  P:   Trending UOP with Foley while on lasix. Monitor electrolytes & renal function daily. Replete lytes as needed. Cont free water.  GASTROINTESTINAL A: No acute issues.  P:   Protonix daily. Continue tube feedings. Swallow evaluation to determine if a PEG is needed.  HEMATOLOGIC A: Anemia - Mild.  P:   Trending cell counts daily w/ CBC SCDs Continue Heparin [place] q8hr  INFECTIOUS A: CAP vs Aspiration Pneumonia  P:   Unasyn Day #8/8 3/31, D/C. Monitor for fever & leukocytosis.  ENDOCRINE A: S/P Total Thyroidectomy - Previously normal TSH.  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 Klonipin 1 mg PO BID. Seroquel 25 mg BID. Start fentanyl patch 75 mcg and off fentanyl drip.  FAMILY UPDATE:  No family bedside.  The patient is critically ill with multiple organ systems failure and requires high complexity decision making for assessment and support, frequent evaluation and titration of therapies, application of advanced monitoring technologies and extensive interpretation of multiple databases.   Critical Care Time devoted to patient care services described in this note is  35  Minutes. This time reflects time of care of this signee Dr Koren Bound. This critical care time does not reflect procedure time, or teaching time or supervisory time of PA/NP/Med student/Med  Resident etc but could involve care discussion time.  Alyson Reedy, M.D. [facility] Pulmonary/Critical Care Medicine. Pager: [phone]. After hours pager: [phone].  03/12/2016

## 2016-03-13 ENCOUNTER — Inpatient Hospital Stay (HOSPITAL_COMMUNITY): Payer: Medicaid Other

## 2016-03-13 LAB — GLUCOSE, CAPILLARY
GLUCOSE-CAPILLARY: 106 mg/dL — AB (ref 65–99)
Glucose-Capillary: 117 mg/dL — ABNORMAL HIGH (ref 65–99)
Glucose-Capillary: 140 mg/dL — ABNORMAL HIGH (ref 65–99)
Glucose-Capillary: 122 mg/dL — ABNORMAL HIGH (ref 65–99)
Glucose-Capillary: 168 mg/dL — ABNORMAL HIGH (ref 65–99)
Glucose-Capillary: 110 mg/dL — ABNORMAL HIGH (ref 65–99)
Glucose-Capillary: 114 mg/dL — ABNORMAL HIGH (ref 65–99)

## 2016-03-13 LAB — MAGNESIUM
Magnesium: 1.7 mg/dL (ref 1.7–2.4)
Magnesium: 2.1 mg/dL (ref 1.7–2.4)

## 2016-03-13 LAB — BASIC METABOLIC PANEL
Anion gap: 12 (ref 5–15)
BUN: 36 mg/dL — AB (ref 6–20)
CHLORIDE: 95 mmol/L — AB (ref 101–111)
CO2: 28 mmol/L (ref 22–32)
Calcium: 7.8 mg/dL — ABNORMAL LOW (ref 8.9–10.3)
Creatinine, Ser: 0.89 mg/dL (ref 0.44–1.00)
GFR calc Af Amer: >60 mL/min (ref >60)
Glucose, Bld: 142 mg/dL — ABNORMAL HIGH (ref 65–99)
POTASSIUM: 3.7 mmol/L (ref 3.5–5.1)
Sodium: 135 mmol/L (ref 135–145)

## 2016-03-13 LAB — CBC
HEMATOCRIT: 31.4 % — AB (ref 36.0–46.0)
HEMOGLOBIN: 9.2 g/dL — AB (ref 12.0–15.0)
MCH: 25.1 pg — ABNORMAL LOW (ref 26.0–34.0)
MCHC: 29.3 g/dL — ABNORMAL LOW (ref 30.0–36.0)
MCV: 85.8 fL (ref 78.0–100.0)
Platelets: 331 10*3/uL (ref 150–400)
RBC: 3.66 MIL/uL — AB (ref 3.87–5.11)
RDW: 18.7 % — ABNORMAL HIGH (ref 11.5–15.5)
WBC: 9.2 10*3/uL (ref 4.0–10.5)

## 2016-03-13 LAB — PHOSPHORUS: PHOSPHORUS: 2.4 mg/dL — AB (ref 2.5–4.6)

## 2016-03-13 LAB — POTASSIUM: Potassium: 4.1 mmol/L (ref 3.5–5.1)

## 2016-03-13 MED ORDER — QUETIAPINE FUMARATE 25 MG PO TABS
25.0000 mg | ORAL_TABLET | Freq: Three times a day (TID) | ORAL | Status: DC
  Administered 2016-03-13 – 2016-03-16 (×9): 25 mg via ORAL
  Filled 2016-03-13 (×11): qty 1

## 2016-03-13 MED ORDER — SODIUM CHLORIDE 0.9% FLUSH
10.0000 mL | Freq: Two times a day (BID) | INTRAVENOUS | Status: DC
  Administered 2016-03-13 – 2016-03-14 (×3): 10 mL
  Administered 2016-03-15: 30 mL
  Administered 2016-03-15: 20 mL
  Administered 2016-03-16: 10 mL
  Administered 2016-03-16: 20 mL
  Administered 2016-03-17 – 2016-03-28 (×20): 10 mL

## 2016-03-13 MED ORDER — CLONAZEPAM 0.5 MG PO TBDP
2.0000 mg | ORAL_TABLET | Freq: Two times a day (BID) | ORAL | Status: DC
  Administered 2016-03-13 – 2016-03-16 (×6): 2 mg
  Filled 2016-03-13 (×6): qty 4

## 2016-03-13 MED ORDER — SODIUM CHLORIDE 0.9% FLUSH
10.0000 mL | INTRAVENOUS | Status: DC | PRN
  Administered 2016-03-16 – 2016-03-17 (×2): 20 mL
  Administered 2016-04-06 – 2016-04-07 (×2): 10 mL
  Filled 2016-03-13 (×4): qty 40

## 2016-03-13 MED ORDER — MAGNESIUM SULFATE 2 GM/50ML IV SOLN
2.0000 g | Freq: Once | INTRAVENOUS | Status: AC
  Administered 2016-03-13: 2 g via INTRAVENOUS
  Filled 2016-03-13: qty 50

## 2016-03-13 MED ORDER — POTASSIUM PHOSPHATES 15 MMOLE/5ML IV SOLN
30.0000 mmol | Freq: Once | INTRAVENOUS | Status: AC
  Administered 2016-03-13: 30 mmol via INTRAVENOUS
  Filled 2016-03-13: qty 10

## 2016-03-13 NOTE — Progress Notes (Signed)
PULMONARY / CRITICAL CARE MEDICINE    Name: Kendra Osborne MRN: [id number] DOB: 08/12/1946    ADMISSION DATE:  02/24/2016  PRIMARY SERVICE: PCCM  CHIEF COMPLAINT:  Dyspnea  BRIEF PATIENT DESCRIPTION: 70 y/o woman recently moved from [place] who presents with respiratory distress. She presented to the ED on 3/17 with complaints of dyspnea, and lower extremity edema. History is limited due to language / culture barriers, but she has a PMHx of a known goiter that has been present for years, as well as possibly hypertension. It is not clear if the goiter was ever worked up or if she ever was on regular therapy for her hypertension. Her Nephew and son (who speaks little [place]) report that she been having a cough for a few days as well as some fevers and chills.  SUBJECTIVE:    Lasted for 8 hours yesterday on TC then tired out.  VITAL SIGNS: Temp:  [98.2 F (36.8 C)-99 F (37.2 C)] 98.7 F (37.1 C) (04/04 0800) Pulse Rate:  [68-98] 72 (04/04 0900) Resp:  [16-71] 16 (04/04 0900) BP: (99-172)/(46-78) 140/72 mmHg (04/04 0900) SpO2:  [89 %-100 %] 96 % (04/04 0900) FiO2 (%):  [30 %-50 %] 30 % (04/04 0900) Weight:  [98 kg (216 lb 0.8 oz)] 98 kg (216 lb 0.8 oz) (04/04 0422) HEMODYNAMICS:   VENTILATOR SETTINGS: Vent Mode:  [-] PRVC FiO2 (%):  [30 %-50 %] 30 % Set Rate:  [18 bmp] 18 bmp Vt Set:  [420 mL] 420 mL PEEP:  [5 cmH20] 5 cmH20 Plateau Pressure:  [17 cmH20-20 cmH20] 17 cmH20  INTAKE / OUTPUT: Intake/Output      04/03 0701 - 04/04 0700 04/04 0701 - 04/05 0700   I.V. (mL/kg) 230 (2.3)    Other 50    NG/GT 1750 62.3   IV Piggyback 100    Total Intake(mL/kg) 2130 (21.7) 62.3 (0.6)   Urine (mL/kg/hr) 2455 (1)    Total Output 2455     Net -325 +62.3         PHYSICAL EXAMINATION: General: acutely ill, eyes open. No family at bedside. No acute distress.  Trach in place.   Integument:  Warm & dry. No rash on exposed skin. Surgical incision on neck clean & dry. HEENT:  No scleral  injection. Endotracheal tube in place.  Cardiovascular:  Regular rate. No edema. No appreciable JVD.  Pulmonary:  Good aeration & clear to auscultation bilaterally. Symmetric chest wall rise on ventilator. Abdomen: Soft. Normal bowel sounds. Nondistended.  Neurological: Spontaneously moving all 4 extremities. Eyes open. Appears grossly nonfocal.  LABS:  CBC  Recent Labs Lab 03/11/16 0640 03/12/16 0555 03/13/16 0411  WBC 11.2* 10.5 9.2  HGB 10.6* 9.6* 9.2*  HCT 35.2* 32.5* 31.4*  PLT 383 362 331   Coag's No results for input(s): APTT, INR in the last 168 hours. BMET  Recent Labs Lab 03/11/16 0640 03/12/16 0555 03/13/16 0411  NA 140 139 135  K 3.8 3.3* 3.7  CL 98* 96* 95*  CO2 31 32 28  BUN 22* 33* 36*  CREATININE 1.03* 1.05* 0.89  GLUCOSE 130* 123* 142*   Electrolytes  Recent Labs Lab 03/11/16 0640 03/12/16 0555 03/13/16 0411  CALCIUM 8.4* 7.9* 7.8*  MG 1.9 1.7 1.7  PHOS 3.7 3.6 2.4*   Sepsis Markers No results for input(s): LATICACIDVEN, PROCALCITON, O2SATVEN in the last 168 hours. ABG  Recent Labs Lab 03/08/16 0341 03/09/16 0406  PHART 7.436 7.404  PCO2ART 45.1* 54.6*  PO2ART 86.1 71.0*  Liver Enzymes  Recent Labs Lab 03/09/16 0518 03/10/16 0525 03/12/16 0555  ALBUMIN 2.2* 2.5* 2.2*   Cardiac Enzymes No results for input(s): TROPONINI, PROBNP in the last 168 hours. Glucose  Recent Labs Lab 03/12/16 1205 03/12/16 1553 03/12/16 1926 03/12/16 2346 03/13/16 0413 03/13/16 0837  GLUCAP 142* 139* 139* 117* 140* 122*   Imaging Dg Abd Portable 1v  03/13/2016  CLINICAL DATA:  Nasogastric tube placement EXAM: PORTABLE ABDOMEN - 1 VIEW COMPARISON:  March 09, 2016. FINDINGS: Nasogastric tube tip and side port are in the distal stomach. Bowel gas pattern is normal. Visualized lung bases clear. No free air evident. IMPRESSION: Nasogastric tube tip and side-port in region of distal stomach. Bowel gas pattern unremarkable. Electronically Signed    By: Bretta Bang III M.D.   On: 03/13/2016 08:21   SIGNIFICANT EVENTS: 3/17 - Admit 3/21 - Thyroidectomy 3/24 - Extubation & reintubation within 2 hours due to hypoxia & secretions  STUDIES:  CT-A of Chest >> mild mediastinal enlargement CT of the Neck with contrast >> multinodular goiter, meningioma Port CXR 3/22:  ETT & CVL in good position. Blunting bilateral costophrenic angles suggestive of effusions. Bilateral hilar fullness & interstitial prominence with low lung volumes.  Port CXR 3/25:. Silhouetting of the left hemidiaphragm. Platelike atelectasis right lower lung. Endotracheal tube in good position.  LINES / TUBES: OETT 7.5 3/17 - 3/24; 7.0 3/24>>>3/31 Trach (ENT) 3/31>>> L Subclavian CVL 3/21>>> L Port 3/21>>> OGT 3/21 - 3/24; 3/24>>> Foley 3/18>>> PIV x1 L Radial Art Line 3/21 - 3/23 R & L Neck Drains 3/21 - ???  MICROBIOLOGY: Tracheal Asp Ctx 3/25>>> ng Blood Ctx x2 3/18:  Negative  MRSA PCR 3/18:  Negative   ANTIBIOTICS: Unasyn 3/24>>> Ancef 3/21 - 3/24 Clindamycin 3/21 (only 1 dose) Azithromycin 3/18 - 3/19 Rocephin 3/18 - 3/20  ASSESSMENT / PLAN:  PULMONARY A: Acute on Chronic Hypercarbic Respiratory Failure Acute Hypoxic Respiratory Failure - Question element of pulmonary edema vs viral bronchitis/pneumonitis. Extrinsic Airway Compression - Secondary to goiter. S/P total thyroidectomy 3/21.  P:   Trach in place. Maintain at [facility] as tolerated. Titrate O2 for sat of 88-92%.  CARDIOVASCULAR A: HTN - TTE 3/19 nml LV fn, gr 1 DD.  P:   Hydralazine IV prn Continuing Lasix IV q12hr for diuresis  RENAL A: Hypokalemia - Replacing. Hypernatremia - Mild but stable. Hypomagnesemia - Resolved. Hyponatremia - Resolved. Acute Renal Failure - Resolved.  P:   Trending UOP with Foley while on lasix. Monitor electrolytes & renal function daily. Replete lytes as needed. Cont free water.  GASTROINTESTINAL A: No acute issues.  P:   Protonix  daily. Continue tube feedings. Swallow evaluation to determine if a PEG is needed, hopefully can be done today while on .  HEMATOLOGIC A: Anemia - Mild.  P:   Trending cell counts daily w/ CBC SCDs Continue Heparin [place] q8hr  INFECTIOUS A: CAP vs Aspiration Pneumonia  P:   Unasyn Day #8/8 3/31, D/C. Monitor for fever & leukocytosis.  ENDOCRINE A: S/P Total Thyroidectomy - Previously normal TSH.  P:   Monitor calcium level w/ daily labs. 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  NEUROLOGIC A: Post-Op Pain  P:   RASS Goal: 0  Versed IV prn Precedex drip off. Increase Klonipin to 2 mg PO BID. Increase Seroquel to 25 mg TID. Continue fentanyl patch 75 mcg and off fentanyl drip.  FAMILY UPDATE:  No family bedside.  The patient is critically ill  with multiple organ systems failure and requires high complexity decision making for assessment and support, frequent evaluation and titration of therapies, application of advanced monitoring technologies and extensive interpretation of multiple databases.   Critical Care Time devoted to patient care services described in this note is  35  Minutes. This time reflects time of care of this signee Dr Koren BoundWesam Effa Yarrow. This critical care time does not reflect procedure time, or teaching time or supervisory time of PA/NP/Med student/Med Resident etc but could involve care discussion time.  Alyson ReedyWesam G. Eyvonne Burchfield, M.D. [facility] Pulmonary/Critical Care Medicine. Pager: [phone]. After hours pager: [phone].  03/13/2016

## 2016-03-13 NOTE — Progress Notes (Signed)
SLP Cancellation Note  Patient Details Name: Kendra Osborne MRN: [id number] DOB: 09/29/1946   Cancelled treatment:        Pt currently on ventilator and not medically ready for PO intake. SLP will continue to follow.   Lynita LombardLauren Johni Narine 03/13/2016, 8:04 AM

## 2016-03-13 NOTE — Progress Notes (Signed)
Pt failed wean attempt at this time due to decreased pt effort.  Per RN pt received dose of versed at 0700, will try wean again later when pt more awake.  RT will monitor.

## 2016-03-13 NOTE — Progress Notes (Signed)
Continuing to wean to trach collar; pt weaned approximately 8 hrs on TC yesterday.  Swallowing eval pending to see if PEG will be needed.  Will continue to follow progress in regards to disposition.    Quintella BatonJulie W. Syre Knerr, RN, BSN  Trauma/Neuro ICU Case Manager [phone]

## 2016-03-13 NOTE — Progress Notes (Signed)
Peripherally Inserted Central Catheter/Midline Placement  The IV Nurse has discussed with the patient and/or persons authorized to consent for the patient, the purpose of this procedure and the potential benefits and risks involved with this procedure.  The benefits include less needle sticks, lab draws from the catheter and patient may be discharged home with the catheter.  Risks include, but not limited to, infection, bleeding, blood clot (thrombus formation), and puncture of an artery; nerve damage and irregular heat beat.  Alternatives to this procedure were also discussed.  Consent obtained from son due to patient's altered mental status.  PICC/Midline Placement Documentation        Kendra Osborne, Lajean ManesKerry Loraine 03/13/2016, 4:41 PM

## 2016-03-13 NOTE — Progress Notes (Signed)
Patient had a 9 beat run of Vtach followed by Sinus rhythm with nonsustained PVCs. Elink aware. Labs ordered.

## 2016-03-13 NOTE — Evaluation (Signed)
[name] Speaking Valve - Evaluation Patient Details  Name: Kendra Osborne MRN: [id number] Date of Birth: 06/19/1946  Today's Date: 03/13/2016 Time: 1158-1226 SLP Time Calculation (min) (ACUTE ONLY): 28 min  Past Medical History:  Past Medical History  Diagnosis Date  . Hypertension    Past Surgical History:  Past Surgical History  Procedure Laterality Date  . Thyroidectomy N/A 02/28/2016    Procedure:  TOTAL THYROIDECTOMY WITH NIMS MONITOR;  Surgeon: Melvenia BeamMitchell Gore, MD;  [facility];  Service: ENT;  Laterality: N/A;  . Tracheostomy tube placement N/A 03/09/2016    Procedure: TRACHEOSTOMY;  Surgeon: Melvenia BeamMitchell Gore, MD;  [facility];  Service: ENT;  Laterality: N/A;   HPI:  70 y.o. woman who recently moved from [name] (no known PMHx) who presented to ED with respiratory distress and lower extremity edema. Pt was intubated 3/17 and failed to wean, therefore trach placed. CXR 3/31 stable bibasilar atelectasis.    Assessment / Plan / Recommendation Clinical Impression  SLP evaluated pt for PMSV. Pt able to tolerate valve well throughout, however noted RR spiked x2. Pulse and SpO2 remained WFL. Vocal quality significantly hoarse, low intensity and audible mucous during phonation. Suspect majority of utterances were achieved through false vocal fold adduction rather than use of true cords. Pt and son educated re: PMSV use/precautions and treatment plan. Pt not currently ready for PO intake, due to inability to maintain alert status. SLP will continue to follow for PMSV tolerance and determine readiness for swallow evaluation. Recommend PMSV use only with SLP with full supervision.    SLP Assessment  Patient needs continued Speech Lanaguage Pathology Services    Follow Up Recommendations   (TBD)    Frequency and Duration min 2x/week  2 weeks    PMSV Trial PMSV was placed for: total of 20 minutes Able to redirect subglottic air through upper airway: Yes Able to Attain Phonation:  Yes (minimal,brief, mostly false cords ) Voice Quality: Hoarse;Low vocal intensity Able to Expectorate Secretions: No Level of Secretion Expectoration with PMSV: Not observed Breath Support for Phonation: Severely decreased Intelligibility: Intelligibility reduced Word: 0-24% accurate Phrase: 0-24% accurate Respirations During Trial:  (12-48, 48 for several seconds) SpO2 During Trial:  (97-100) Pulse During Trial:  (60-75) Behavior: Confused;Cooperative;Lethargic;Poor eye contact   Tracheostomy Tube       Vent Dependency  FiO2 (%): 50 %    Cuff Deflation Trial  GO Tolerated Cuff Deflation: Yes Length of Time for Cuff Deflation Trial: 25 Behavior: Cooperative;Listless;Poor eye contact        Lynita LombardLauren Greyden Besecker 03/13/2016, 2:03 PM   Lynita LombardLauren Rebel Willcutt, Student-SLP

## 2016-03-13 NOTE — Evaluation (Deleted)
Speech Language Pathology Evaluation Patient Details Name: Kendra Osborne MRN: [id number] DOB: 01/17/1946 Today's Date: 03/13/2016 Time: [id number] SLP Time Calculation (min) (ACUTE ONLY): 28 min  Problem List:  Patient Active Problem List   Diagnosis Date Noted  . Acute respiratory failure with hypoxia (HCC) 03/07/2016  . Acute encephalopathy 03/07/2016  . Acute pulmonary edema (HCC) 03/07/2016  . Respiratory failure (HCC) 02/25/2016   Past Medical History:  Past Medical History  Diagnosis Date  . Hypertension    Past Surgical History:  Past Surgical History  Procedure Laterality Date  . Thyroidectomy N/A 02/28/2016    Procedure:  TOTAL THYROIDECTOMY WITH NIMS MONITOR;  Surgeon: Melvenia BeamMitchell Gore, MD;  [facility];  Service: ENT;  Laterality: N/A;  . Tracheostomy tube placement N/A 03/09/2016    Procedure: TRACHEOSTOMY;  Surgeon: Melvenia BeamMitchell Gore, MD;  [facility];  Service: ENT;  Laterality: N/A;   HPI:  70 y.o. woman who recently moved from [name] (no known PMHx) who presented to ED with respiratory distress and lower extremity edema. Pt was intubated 3/17 and failed to wean, therefore trach placed. CXR 3/31 stable bibasilar atelectasis.    Assessment / Plan / Recommendation Clinical Impression  SLP evaluated pt for PMSV. Pt able to tolerate valve well throughout, however noted RR spiked x2. Pulse and SpO2 remained WFL. Vocal quality significantly hoarse, low intensity and audible mucous during phonation. Suspect majority of utterances were achieved through false vocal fold adduction rather than use of true cords. Pt and son educated re: PMSV use/precautions and treatment plan. Pt not currently ready for PO intake, due to inability to maintain alert status. SLP will continue to follow for PMSV tolerance and determine readiness for swallow evaluation. Recommend PMSV use only with SLP with full supervision.    SLP Assessment  Patient needs continued Speech Lanaguage Pathology  Services    Follow Up Recommendations   (TBD)    Frequency and Duration min 2x/week  2 weeks      SLP Evaluation Prior Functioning      Cognition  Orientation Level: Intubated/Tracheostomy - Unable to assess    Comprehension       Expression     Oral / Motor  Motor Speech Intelligibility: Intelligibility reduced Word: 0-24% accurate Phrase: 0-24% accurate    [name] Beckie SaltsBoyette, Student-SLP                    Lynita LombardLauren Kelaiah Escalona 03/13/2016, 1:53 PM

## 2016-03-13 NOTE — Progress Notes (Signed)
PT Cancellation Note  Patient Details Name: Kendra Osborne MRN: [id number] DOB: 10/04/1946   Cancelled Treatment:    Reason Eval/Treat Not Completed: Medical issues which prohibited therapy.  Pt currently on trach collar and per conversation with RN and MD, will hold PT and mobility at this time to allow pt best opportunity to participate with SLP while on trach collar.  Will f/u as appropriate.   Sunny SchleinRitenour, Kamoni Depree F, [place] CarolinaPT [phone] 03/13/2016, 10:02 AM

## 2016-03-14 ENCOUNTER — Encounter (HOSPITAL_COMMUNITY): Payer: Self-pay | Admitting: Radiology

## 2016-03-14 LAB — CBC
HEMATOCRIT: 31.7 % — AB (ref 36.0–46.0)
HEMOGLOBIN: 9.6 g/dL — AB (ref 12.0–15.0)
MCH: 26.4 pg (ref 26.0–34.0)
MCHC: 30.3 g/dL (ref 30.0–36.0)
MCV: 87.3 fL (ref 78.0–100.0)
Platelets: 310 10*3/uL (ref 150–400)
RBC: 3.63 MIL/uL — AB (ref 3.87–5.11)
RDW: 18.7 % — ABNORMAL HIGH (ref 11.5–15.5)
WBC: 8 10*3/uL (ref 4.0–10.5)

## 2016-03-14 LAB — PHOSPHORUS: PHOSPHORUS: 4.6 mg/dL (ref 2.5–4.6)

## 2016-03-14 LAB — GLUCOSE, CAPILLARY
GLUCOSE-CAPILLARY: 136 mg/dL — AB (ref 65–99)
GLUCOSE-CAPILLARY: 109 mg/dL — AB (ref 65–99)
GLUCOSE-CAPILLARY: 125 mg/dL — AB (ref 65–99)
GLUCOSE-CAPILLARY: 117 mg/dL — AB (ref 65–99)
Glucose-Capillary: 132 mg/dL — ABNORMAL HIGH (ref 65–99)
Glucose-Capillary: 116 mg/dL — ABNORMAL HIGH (ref 65–99)

## 2016-03-14 LAB — BASIC METABOLIC PANEL
ANION GAP: 11 (ref 5–15)
BUN: 33 mg/dL — ABNORMAL HIGH (ref 6–20)
CHLORIDE: 92 mmol/L — AB (ref 101–111)
CO2: 35 mmol/L — AB (ref 22–32)
CREATININE: 0.78 mg/dL (ref 0.44–1.00)
Calcium: 8.1 mg/dL — ABNORMAL LOW (ref 8.9–10.3)
GFR calc non Af Amer: >60 mL/min (ref >60)
Glucose, Bld: 135 mg/dL — ABNORMAL HIGH (ref 65–99)
POTASSIUM: 3.8 mmol/L (ref 3.5–5.1)
SODIUM: 138 mmol/L (ref 135–145)

## 2016-03-14 LAB — MAGNESIUM: MAGNESIUM: 2.2 mg/dL (ref 1.7–2.4)

## 2016-03-14 MED ORDER — CEFAZOLIN SODIUM-DEXTROSE 2-4 GM/100ML-% IV SOLN
2.0000 g | INTRAVENOUS | Status: AC
  Administered 2016-03-15: 2 g via INTRAVENOUS
  Filled 2016-03-14: qty 100

## 2016-03-14 NOTE — Progress Notes (Signed)
PULMONARY / CRITICAL CARE MEDICINE    Name: Kendra Osborne MRN: [id number] DOB: 03/02/1946    ADMISSION DATE:  02/24/2016  PRIMARY SERVICE: PCCM  CHIEF COMPLAINT:  Dyspnea  BRIEF PATIENT DESCRIPTION: 70 y/o woman recently moved from [name] who presents with respiratory distress. She presented to the ED on 3/17 with complaints of dyspnea, and lower extremity edema. History is limited due to language / culture barriers, but she has a PMHx of a known goiter that has been present for years, as well as possibly hypertension. It is not clear if the goiter was ever worked up or if she ever was on regular therapy for her hypertension. Her Nephew and son (who speaks little [place]) report that she been having a cough for a few days as well as some fevers and chills.  SUBJECTIVE:    Lasted for 8 hours yesterday on TC then tired out.  VITAL SIGNS: Temp:  [98.4 F (36.9 C)-98.9 F (37.2 C)] 98.4 F (36.9 C) (04/05 0800) Pulse Rate:  [65-104] 72 (04/05 0900) Resp:  [22-40] 22 (04/05 0900) BP: (82-161)/(47-86) 129/56 mmHg (04/05 0900) SpO2:  [93 %-100 %] 93 % (04/05 0900) FiO2 (%):  [35 %-60 %] 35 % (04/05 0800) Weight:  [99.3 kg (218 lb 14.7 oz)] 99.3 kg (218 lb 14.7 oz) (04/05 0458) HEMODYNAMICS:   VENTILATOR SETTINGS: Vent Mode:  [-]  FiO2 (%):  [35 %-60 %] 35 %  INTAKE / OUTPUT: Intake/Output      04/04 0701 - 04/05 0700 04/05 0701 - 04/06 0700   I.V. (mL/kg)     Other     NG/GT 1676 110   IV Piggyback 390    Total Intake(mL/kg) 2066 (20.8) 110 (1.1)   Urine (mL/kg/hr) 2200 (0.9) 330 (1.4)   Total Output 2200 330   Net -134 -220         PHYSICAL EXAMINATION: General: acutely ill, eyes open. No family at bedside. No acute distress.  Trach in place.   Integument:  Warm & dry. No rash on exposed skin. Surgical incision on neck clean & dry. HEENT:  No scleral injection. Endotracheal tube in place.  Cardiovascular:  Regular rate. No edema. No appreciable JVD.  Pulmonary:  Good  aeration & clear to auscultation bilaterally. Symmetric chest wall rise on ventilator. Abdomen: Soft. Normal bowel sounds. Nondistended.  Neurological: Spontaneously moving all 4 extremities. Eyes open. Appears grossly nonfocal.  LABS:  CBC  Recent Labs Lab 03/12/16 0555 03/13/16 0411 03/14/16 0415  WBC 10.5 9.2 8.0  HGB 9.6* 9.2* 9.6*  HCT 32.5* 31.4* 31.7*  PLT 362 331 310   Coag's No results for input(s): APTT, INR in the last 168 hours. BMET  Recent Labs Lab 03/12/16 0555 03/13/16 0411 03/13/16 1914 03/14/16 0415  NA 139 135  --  138  K 3.3* 3.7 4.1 3.8  CL 96* 95*  --  92*  CO2 32 28  --  35*  BUN 33* 36*  --  33*  CREATININE 1.05* 0.89  --  0.78  GLUCOSE 123* 142*  --  135*   Electrolytes  Recent Labs Lab 03/12/16 0555 03/13/16 0411 03/13/16 1914 03/14/16 0415  CALCIUM 7.9* 7.8*  --  8.1*  MG 1.7 1.7 2.1 2.2  PHOS 3.6 2.4*  --  4.6   Sepsis Markers No results for input(s): LATICACIDVEN, PROCALCITON, O2SATVEN in the last 168 hours. ABG  Recent Labs Lab 03/08/16 0341 03/09/16 0406  PHART 7.436 7.404  PCO2ART 45.1* 54.6*  PO2ART 86.1  71.0*   Liver Enzymes  Recent Labs Lab 03/09/16 0518 03/10/16 0525 03/12/16 0555  ALBUMIN 2.2* 2.5* 2.2*   Cardiac Enzymes No results for input(s): TROPONINI, PROBNP in the last 168 hours. Glucose  Recent Labs Lab 03/13/16 1250 03/13/16 1710 03/13/16 1935 03/13/16 2319 03/14/16 0333 03/14/16 0823  GLUCAP 168* 110* 114* 106* 136* 132*   Imaging Dg Abd Portable 1v  03/13/2016  CLINICAL DATA:  Nasogastric tube placement EXAM: PORTABLE ABDOMEN - 1 VIEW COMPARISON:  March 09, 2016. FINDINGS: Nasogastric tube tip and side port are in the distal stomach. Bowel gas pattern is normal. Visualized lung bases clear. No free air evident. IMPRESSION: Nasogastric tube tip and side-port in region of distal stomach. Bowel gas pattern unremarkable. Electronically Signed   By: Bretta Bang III M.D.   On: 03/13/2016  08:21   SIGNIFICANT EVENTS: 3/17 - Admit 3/21 - Thyroidectomy 3/24 - Extubation & reintubation within 2 hours due to hypoxia & secretions  STUDIES:  CT-A of Chest >> mild mediastinal enlargement CT of the Neck with contrast >> multinodular goiter, meningioma Port CXR 3/22:  ETT & CVL in good position. Blunting bilateral costophrenic angles suggestive of effusions. Bilateral hilar fullness & interstitial prominence with low lung volumes.  Port CXR 3/25:. Silhouetting of the left hemidiaphragm. Platelike atelectasis right lower lung. Endotracheal tube in good position.  LINES / TUBES: OETT 7.5 3/17 - 3/24; 7.0 3/24>>>3/31 Trach (ENT) 3/31>>> L Subclavian CVL 3/21>>> L Port 3/21>>> OGT 3/21 - 3/24; 3/24>>> Foley 3/18>>> PIV x1 L Radial Art Line 3/21 - 3/23 R & L Neck Drains 3/21 - ???  MICROBIOLOGY: Tracheal Asp Ctx 3/25>>> ng Blood Ctx x2 3/18:  Negative  MRSA PCR 3/18:  Negative   ANTIBIOTICS: Unasyn 3/24>>> Ancef 3/21 - 3/24 Clindamycin 3/21 (only 1 dose) Azithromycin 3/18 - 3/19 Rocephin 3/18 - 3/20  ASSESSMENT / PLAN:  PULMONARY A: Acute on Chronic Hypercarbic Respiratory Failure Acute Hypoxic Respiratory Failure - Question element of pulmonary edema vs viral bronchitis/pneumonitis. Extrinsic Airway Compression - Secondary to goiter. S/P total thyroidectomy 3/21.  P:   Trach in place. Maintain at [facility] as tolerated. Titrate O2 for sat of 88-92%. Swallow evaluation today, if passes then no PEG, if not then PEG.  CARDIOVASCULAR A: HTN - TTE 3/19 nml LV fn, gr 1 DD.  P:   Hydralazine IV PRN. Continuing Lasix IV q12hr for diuresis.  RENAL A: Hypokalemia - Replacing. Hypernatremia - Mild but stable. Hypomagnesemia - Resolved. Hyponatremia - Resolved. Acute Renal Failure - Resolved.  P:   Trending UOP with Foley while on lasix. Monitor electrolytes & renal function daily. Replete lytes as needed. Cont free water.  GASTROINTESTINAL A: No acute  issues.  P:   Protonix daily. Continue tube feedings. Swallow evaluation to determine if a PEG is needed, to be done at 10 AM.  HEMATOLOGIC A: Anemia - Mild.  P:   Trending cell counts daily w/ CBC. SCDs. Continue Heparin [place] q8hr.  INFECTIOUS A: CAP vs Aspiration Pneumonia  P:   Unasyn Day #8/8 3/31, D/C. Monitor for fever & leukocytosis.  ENDOCRINE A: S/P Total Thyroidectomy - Previously normal TSH.  P:   Monitor calcium level w/ daily labs. 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  NEUROLOGIC A: Post-Op Pain  P:   RASS Goal: 0  Versed IV prn Precedex drip off. Increase Klonipin to 2 mg PO BID. Increase Seroquel to 25 mg TID. Continue fentanyl patch 75 mcg and off fentanyl drip.  FAMILY UPDATE:  No family bedside.  Continue TC as tolerated, include overnight tonight, if passes swallow evaluation then no PEG, if not then will ask surgery to place PEG.  The patient is critically ill with multiple organ systems failure and requires high complexity decision making for assessment and support, frequent evaluation and titration of therapies, application of advanced monitoring technologies and extensive interpretation of multiple databases.   Critical Care Time devoted to patient care services described in this note is  35  Minutes. This time reflects time of care of this signee Dr Koren Bound. This critical care time does not reflect procedure time, or teaching time or supervisory time of PA/NP/Med student/Med Resident etc but could involve care discussion time.  Alyson Reedy, M.D. [facility] Pulmonary/Critical Care Medicine. Pager: [phone]. After hours pager: [phone].  03/14/2016

## 2016-03-14 NOTE — Progress Notes (Signed)
Occupational Therapy Treatment Patient Details Name: Kendra Osborne MRN: [id number] DOB: 10/27/1946 Today's Date: 03/14/2016    History of present illness pt presents with Large thyroid mass/goiter and Respiratory Difficulties.  pt has underwent Total Thyroidectomy with goiter removal and has remained intubated with one failed extubation on 3/24.  [name] and only known medical hx is HTN and Goiter.     OT comments  Pt tolerated EOB sitting x 10 mins with mod - max A , which is significantly more assist than she required previously.  She did not attempt to participate in grooming activities.  Unable to safely attempt transfer to chair - RN instructed to use lift equipment. VSS with pt on 30% FI02  Follow Up Recommendations  CIR;Supervision/Assistance - 24 hour    Equipment Recommendations  3 in 1 bedside comode    Recommendations for Other Services      Precautions / Restrictions Precautions Precautions: Fall Precaution Comments: trach collar        Mobility Bed Mobility Overal bed mobility: Needs Assistance Bed Mobility: Rolling;Supine to Sit;Sit to Supine Rolling: Total assist   Supine to sit: Total assist;+2 for physical assistance Sit to supine: Total assist;+2 for physical assistance   General bed mobility comments: Pt pushing heavily to the Rt (towards the bed) when attempting to lift trunk from bed.  Does not attempt to assist with any aspects of bed mobility   Transfers                 General transfer comment: unable to safely attempt - recommend use of lift equipment     Balance Overall balance assessment: Needs assistance Sitting-balance support: Feet supported Sitting balance-Leahy Scale: Poor Sitting balance - Comments: Pt sat EOB x 10 mins with max A progressing to mod A.  Forward and Rt lean  Postural control: Right lateral lean     Standing balance comment: unable to safely attempt                    ADL                                          General ADL Comments: Pt did not engage in simple ADL tasks       Vision                     Perception     Praxis      Cognition   Behavior During Therapy: Flat affect Overall Cognitive Status: Difficult to assess                       Extremity/Trunk Assessment               Exercises     Shoulder Instructions       General Comments      Pertinent Vitals/ Pain       Pain Assessment: Faces Faces Pain Scale: Hurts even more Pain Location: grimaces with Lt shoulder movement and grimaces in general  Pain Descriptors / Indicators: Grimacing;Guarding Pain Intervention(s): Limited activity within patient's tolerance;Monitored during session  Home Living  Prior Functioning/Environment              Frequency Min 2X/week     Progress Toward Goals  OT Goals(current goals can now be found in the care plan section)  Progress towards OT goals: Progressing toward goals (slowly )  ADL Goals Pt Will Perform Grooming: with min assist;standing Pt Will Perform Upper Body Bathing: with supervision;sitting Pt Will Perform Lower Body Bathing: with min assist;sit to/from stand Pt Will Perform Upper Body Dressing: with min assist;sitting Pt Will Perform Lower Body Dressing: with min assist;sit to/from stand Pt Will Transfer to Toilet: with min assist;ambulating;regular height toilet;bedside commode;grab bars Pt Will Perform Toileting - Clothing Manipulation and hygiene: with min assist;sit to/from stand Pt/caregiver will Perform Home Exercise Program: Increased strength;Both right and left upper extremity;With theraband;With Supervision;With written HEP provided  Plan Discharge plan remains appropriate    Co-evaluation                 End of Session Equipment Utilized During Treatment: Oxygen   Activity Tolerance Patient  tolerated treatment well   Patient Left in bed;with call bell/phone within reach   Nurse Communication Mobility status;Need for lift equipment        Time: 1610-9604 OT Time Calculation (min): 37 min  Charges: OT General Charges $OT Visit: 1 Procedure OT Treatments $Therapeutic Activity: 23-37 mins  Mico Spark M 03/14/2016, 3:24 PM

## 2016-03-14 NOTE — Progress Notes (Signed)
[name] Speaking Valve - Treatment Patient Details  Name: Kendra Osborne MRN: [id number] Date of Birth: 03/18/1946  Today's Date: 03/14/2016 Time: 4098-11911038-1052 SLP Time Calculation (min) (ACUTE ONLY): 14 min  Past Medical History:  Past Medical History  Diagnosis Date  . Hypertension    Past Surgical History:  Past Surgical History  Procedure Laterality Date  . Thyroidectomy N/A 02/28/2016    Procedure:  TOTAL THYROIDECTOMY WITH NIMS MONITOR;  Surgeon: Melvenia BeamMitchell Gore, MD;  [facility];  Service: ENT;  Laterality: N/A;  . Tracheostomy tube placement N/A 03/09/2016    Procedure: TRACHEOSTOMY;  Surgeon: Melvenia BeamMitchell Gore, MD;  [facility];  Service: ENT;  Laterality: N/A;    Assessment / Plan / Recommendation Clinical Impression  RN provided deep suctioning before placement of valve. Pt tolerated PMSV for total of 25 minutes throughout tx and bedside swallow evaluation. SLP removed PMSV x5 after observing clavicular breathing RR elevated to >50/min (briefly). Pulse and SpO2 remained WFL throughout session. Pt able to phonate x 2 with detectable significantly low vocal intensity and breathy quality with continued audible mucous. SLP provided mod verbal and visual cues for lower abdominal breathing to increase breath support during phonation. Recommend continue PMSV trials with SLP only with full supervision. Pt and son educated re: PMSV use/precautions and continued f/u by SLP.    Plan    Continue PMSV trials with SLP only with full supervision   Follow Up Recommendations   (TBD)        SLP Goals Potential to Achieve Goals (ACUTE ONLY): Fair Potential Considerations (ACUTE ONLY): Ability to learn/carryover information;Medical prognosis;Severity of impairments   PMSV Trial  PMSV was placed for: total of 25 minutes, including during BSE Able to redirect subglottic air through upper airway: Yes Able to Attain Phonation: Yes Voice Quality: Hoarse;Low vocal intensity Able to  Expectorate Secretions: Yes Level of Secretion Expectoration with PMSV: Oral Breath Support for Phonation: Severely decreased Intelligibility: Intelligibility reduced Word: 0-24% accurate Phrase: Not tested Sentence: Not tested Conversation: Not tested Respirations During Trial:  (22-61) SpO2 During Trial:  (87-100) Pulse During Trial:  (75-86) Behavior: Confused;Cooperative;Lethargic;Poor eye contact   Tracheostomy Tube       Vent Dependency  FiO2 (%): 35 %    Cuff Deflation Trial  GO     Tolerated Cuff Deflation: Yes Length of Time for Cuff Deflation Trial: 40 Behavior: Cooperative;Poor eye contact   Lynita LombardLauren Meyer Arora 03/14/2016, 11:19 AM  Lynita LombardLauren Jesslynn Kruck, Student-SLP

## 2016-03-14 NOTE — Evaluation (Signed)
Clinical/Bedside Swallow Evaluation Patient Details  Name: Kendra Osborne MRN: [id number] Date of Birth: 03/22/1946  Today's Date: 03/14/2016 Time: SLP Start Time (ACUTE ONLY): 1038 SLP Stop Time (ACUTE ONLY): 1052 SLP Time Calculation (min) (ACUTE ONLY): 14 min  Past Medical History:  Past Medical History  Diagnosis Date  . Hypertension    Past Surgical History:  Past Surgical History  Procedure Laterality Date  . Thyroidectomy N/A 02/28/2016    Procedure:  TOTAL THYROIDECTOMY WITH NIMS MONITOR;  Surgeon: Melvenia BeamMitchell Gore, MD;  [facility];  Service: ENT;  Laterality: N/A;  . Tracheostomy tube placement N/A 03/09/2016    Procedure: TRACHEOSTOMY;  Surgeon: Melvenia BeamMitchell Gore, MD;  [facility];  Service: ENT;  Laterality: N/A;   HPI:  70 y.o. woman who recently moved from [name] (no known PMHx) who presented to ED with respiratory distress and lower extremity edema. Pt was intubated 3/17 and failed to wean, therefore trach placed. CXR 3/31 stable bibasilar atelectasis.    Assessment / Plan / Recommendation Clinical Impression  Pt presented with ice chips via spoon to assess oropharyngeal swallow. Weak lingual manipulation observed with bolus falling to back of tongue quickly and other times with prolonged transit. SLP provided mod tactile assist to facilitate head flexion to prevent premature spillage. Pt c/o odynophagia throughout assessment likely due to intubation and surgery for neck goiter (son used as interpreter). Wet vocal quality also noted after intake and inability to perform effecitve volitional cough. Given clinical observations recommend continue NPO status. Prognosis for improvement is good, however pt may need extended amount of time for dysphagia intervention and may benefit from longer term means of nutrition. Will continue to follow for improved cough and ability to manage/clear secretions and increased vocal cord adduction moving towards recommendation of objective  assessment.     Aspiration Risk  Severe aspiration risk    Diet Recommendation NPO;Alternative means - temporary   Medication Administration: Via alternative means    Other  Recommendations Oral Care Recommendations: Oral care QID   Follow up Recommendations   (TBD)    Frequency and Duration min 2x/week  2 weeks       Prognosis Prognosis for Safe Diet Advancement: Fair Barriers to Reach Goals: Time post onset;Severity of deficits      Swallow Study   General HPI: 70 y.o. woman who recently moved from [name] (no known PMHx) who presented to ED with respiratory distress and lower extremity edema. Pt was intubated 3/17 and failed to wean, therefore trach placed. CXR 3/31 stable bibasilar atelectasis.  Type of Study: Bedside Swallow Evaluation Previous Swallow Assessment: none found Diet Prior to this Study: NPO Temperature Spikes Noted: No Respiratory Status: Trach Collar;Trach Trach Size and Type: #6;Cuff;With PMSV in place;Deflated History of Recent Intubation: Yes Length of Intubations (days):  (intubated 3/17, failed to wean s/p trach placement) Date extubated:  (s/p trach placement) Behavior/Cognition: Cooperative;Lethargic/Drowsy;Requires cueing;Distractible Oral Cavity Assessment: Within Functional Limits Oral Care Completed by SLP: Recent completion by staff Oral Cavity - Dentition: Poor condition;Adequate natural dentition Vision: Functional for self-feeding Self-Feeding Abilities: Total assist Patient Positioning: Upright in bed Baseline Vocal Quality: Hoarse;Low vocal intensity Volitional Cough: Weak;Congested Volitional Swallow: Able to elicit    Oral/Motor/Sensory Function Overall Oral Motor/Sensory Function: Within functional limits   Ice Chips Ice chips: Impaired Presentation: Spoon Oral Phase Impairments: Reduced lingual movement/coordination;Reduced labial seal Oral Phase Functional Implications: Other (comment);Prolonged oral transit Pharyngeal Phase  Impairments: Other (comments);Decreased hyoid-laryngeal movement;Wet Vocal Quality (odynophagia)   Thin Liquid Thin  Liquid: Not tested    Nectar Thick Nectar Thick Liquid: Not tested   Honey Thick Honey Thick Liquid: Not tested   Puree Puree: Not tested   Solid   GO   Solid: Not tested        Kendra Osborne 03/14/2016,11:46 AM  Kendra Osborne, Student-SLP

## 2016-03-14 NOTE — Progress Notes (Signed)
Chief Complaint: Patient was seen in consultation today for gastrostomy tube placement at the request of Dr. Koren Bound  Referring Physician(s): Dr. Koren Bound  Supervising Physician: Ruel Favors  History of Present Illness: Kendra Osborne is a 70 y.o. female with respiratory failure. She now has tracheostomy and has failed MBS studies. She is currently receiving TF via NGT, tolerating well. The family wishes to move forward with perc gastrostomy tube. IR is consulted to perform this. Chart, PMHx, meds, labs, imaging, allergies reviewed. Pt seen in ICU, no family present.  Past Medical History  Diagnosis Date  . Hypertension     Past Surgical History  Procedure Laterality Date  . Thyroidectomy N/A 02/28/2016    Procedure:  TOTAL THYROIDECTOMY WITH NIMS MONITOR;  Surgeon: Melvenia Beam, MD;  Location: [facility] OR;  Service: ENT;  Laterality: N/A;  . Tracheostomy tube placement N/A 03/09/2016    Procedure: TRACHEOSTOMY;  Surgeon: Melvenia Beam, MD;  Location: [facility] OR;  Service: ENT;  Laterality: N/A;    Allergies: Review of patient's allergies indicates not on file.  Medications:  Current facility-administered medications:  .  0.9 %  sodium chloride infusion, 250 mL, Intravenous, PRN, Jamie Kato, MD, Last Rate: 10 mL/hr at 03/13/16 0600, 250 mL at 03/13/16 0600 .  antiseptic oral rinse (CPC / CETYLPYRIDINIUM CHLORIDE 0.05%) solution 7 mL, 7 mL, Mouth Rinse, q12n4p, Alyson Reedy, MD, 7 mL at 03/14/16 1235 .  bacitracin ointment, , Topical, TID, Melvenia Beam, MD, [phone] application at 03/14/16 1004 .  [START ON 03/15/2016] ceFAZolin (ANCEF) IVPB 2g/100 mL premix, 2 g, Intravenous, On Call, Brayton El, PA-C .  chlorhexidine (PERIDEX) 0.12 % solution 15 mL, 15 mL, Mouth Rinse, BID, Alyson Reedy, MD, 15 mL at 03/14/16 1004 .  clonazePAM (KLONOPIN) disintegrating tablet 2 mg, 2 mg, Per Tube, BID, Alyson Reedy, MD, 2 mg at 03/14/16 1000 .  feeding supplement (VITAL HIGH  PROTEIN) liquid 1,000 mL, 1,000 mL, Per Tube, Continuous, Alyson Reedy, MD, Last Rate: 55 mL/hr at 03/14/16 1400, 1,000 mL at 03/14/16 1400 .  fentaNYL (DURAGESIC - dosed mcg/hr) patch 75 mcg, 75 mcg, Transdermal, Q72H, Alyson Reedy, MD, 75 mcg at 03/14/16 0820 .  free water 200 mL, 200 mL, Per Tube, 4 times per day, Cyril Mourning V, MD, 200 mL at 03/14/16 1235 .  furosemide (LASIX) injection 40 mg, 40 mg, Intravenous, Q12H, Cyril Mourning V, MD, 40 mg at 03/14/16 0458 .  heparin injection 5,000 Units, 5,000 Units, Subcutaneous, 3 times per day, Roslynn Amble, MD, Stopped at 03/15/16 0600 .  hydrALAZINE (APRESOLINE) injection 10 mg, 10 mg, Intravenous, Q4H PRN, Roslynn Amble, MD, 10 mg at 03/10/16 0981 .  iohexol (OMNIPAQUE) 350 MG/ML injection 100 mL, 100 mL, Intravenous, Once PRN, Azalia Bilis, MD .  levothyroxine (SYNTHROID, LEVOTHROID) tablet 100 mcg, 100 mcg, Oral, QAC breakfast, Cyril Mourning V, MD, 100 mcg at 03/14/16 0818 .  midazolam (VERSED) injection 1-2 mg, 1-2 mg, Intravenous, Q2H PRN, Alyson Reedy, MD, 2 mg at 03/13/16 0708 .  ondansetron (ZOFRAN) injection 4 mg, 4 mg, Intravenous, Q8H PRN, Zigmund Gottron, MD, 4 mg at 03/09/16 0140 .  pantoprazole sodium (PROTONIX) 40 mg/20 mL oral suspension 40 mg, 40 mg, Per Tube, Daily, Roslynn Amble, MD, 40 mg at 03/14/16 1000 .  pneumococcal 23 valent vaccine (PNU-IMMUNE) injection 0.5 mL, 0.5 mL, Intramuscular, Prior to discharge, Nelda Bucks, MD .  QUEtiapine (SEROQUEL) tablet 25 mg, 25  mg, Oral, TID, Alyson Reedy, MD, 25 mg at 03/14/16 1000 .  sodium chloride flush (NS) 0.9 % injection 10-40 mL, 10-40 mL, Intracatheter, Q12H, Nelda Bucks, MD, 10 mL at 03/14/16 1005 .  sodium chloride flush (NS) 0.9 % injection 10-40 mL, 10-40 mL, Intracatheter, PRN, Nelda Bucks, MD    History reviewed. No pertinent family history.  Social History   Social History  . Marital Status: Widowed    Spouse Name: N/A  .  Number of Children: N/A  . Years of Education: N/A   Social History Main Topics  . Smoking status: Never Smoker   . Smokeless tobacco: None  . Alcohol Use: No  . Drug Use: No  . Sexual Activity: Not Asked   Other Topics Concern  . None   Social History Narrative    Review of Systems: A 12 point ROS discussed and pertinent positives are indicated in the HPI above.  All other systems are negative.  Review of Systems  Vital Signs: BP 112/49 mmHg  Pulse 67  Temp(Src) 98.5 F (36.9 C) (Axillary)  Resp 49  Ht 5' (1.524 m)  Wt 218 lb 14.7 oz (99.3 kg)  BMI 42.75 kg/m2  SpO2 97%  Physical Exam  Constitutional: She appears well-developed and well-nourished.  On vent via trach. Opens eyes to stimulus  HENT:  Head: Normocephalic.  Mouth/Throat: Oropharynx is clear and moist.  Neck:  Tracheostomy intact.  Cardiovascular: Normal rate, regular rhythm and normal heart sounds.   Pulmonary/Chest: Effort normal and breath sounds normal.  Abdominal: Soft. She exhibits no mass. There is no tenderness.    Mallampati Score:  MD Evaluation Airway: Other (comments) Airway comments: tracheostomy Heart: WNL Abdomen: WNL Chest/ Lungs: WNL ASA  Classification: 4 Mallampati/Airway Score:  (trachesostomy)    Labs:  CBC:  Recent Labs  03/11/16 0640 03/12/16 0555 03/13/16 0411 03/14/16 0415  WBC 11.2* 10.5 9.2 8.0  HGB 10.6* 9.6* 9.2* 9.6*  HCT 35.2* 32.5* 31.4* 31.7*  PLT 383 362 331 310    COAGS: No results for input(s): INR, APTT in the last 8760 hours.  BMP:  Recent Labs  03/11/16 0640 03/12/16 0555 03/13/16 0411 03/13/16 1914 03/14/16 0415  NA 140 139 135  --  138  K 3.8 3.3* 3.7 4.1 3.8  CL 98* 96* 95*  --  92*  CO2 31 32 28  --  35*  GLUCOSE 130* 123* 142*  --  135*  BUN 22* 33* 36*  --  33*  CALCIUM 8.4* 7.9* 7.8*  --  8.1*  CREATININE 1.03* 1.05* 0.89  --  0.78  GFRNONAA 55* 53* >60  --  >60  GFRAA >60 >60 >60  --  >60    LIVER FUNCTION  TESTS:  Recent Labs  03/08/16 0430 03/09/16 0518 03/10/16 0525 03/12/16 0555  ALBUMIN 2.1* 2.2* 2.5* 2.2*    TUMOR MARKERS: No results for input(s): AFPTM, CEA, CA199, CHROMGRNA in the last 8760 hours.  Assessment and Plan: Respiratory failure, s/p trach Dysphagia, FTT Plan for Perc G-tube tomorrow. Reviewed recent CT imaging, appears to have adequate percutaneous window for perc g-tube. Labs ok, will check coags Risks and Benefits discussed with the patient's son including, but not limited to the need for a barium enema during the procedure, bleeding, infection, peritonitis, or damage to adjacent structures. All of the patient's questions were answered, patient is agreeable to proceed. Consent obtained from the son, Kendra Osborne    Thank you for this interesting consult. Marland Kitchen  A copy of this report was sent to the requesting provider on this date.  Electronically Signed: Brayton ElBRUNING, Noor Witte 03/14/2016, 2:42 PM   I spent a total of 20 minutes in face to face in clinical consultation, greater than 50% of which was counseling/coordinating care for gastrostomy tube placement

## 2016-03-15 ENCOUNTER — Inpatient Hospital Stay (HOSPITAL_COMMUNITY): Payer: Medicaid Other

## 2016-03-15 DIAGNOSIS — J9601 Acute respiratory failure with hypoxia: Secondary | ICD-10-CM | POA: Diagnosis present

## 2016-03-15 DIAGNOSIS — J9600 Acute respiratory failure, unspecified whether with hypoxia or hypercapnia: Secondary | ICD-10-CM | POA: Diagnosis present

## 2016-03-15 DIAGNOSIS — Z87898 Personal history of other specified conditions: Secondary | ICD-10-CM

## 2016-03-15 DIAGNOSIS — Z931 Gastrostomy status: Secondary | ICD-10-CM | POA: Diagnosis present

## 2016-03-15 LAB — CBC
HEMATOCRIT: 32.4 % — AB (ref 36.0–46.0)
Hemoglobin: 9.7 g/dL — ABNORMAL LOW (ref 12.0–15.0)
MCH: 25.5 pg — ABNORMAL LOW (ref 26.0–34.0)
MCHC: 29.9 g/dL — ABNORMAL LOW (ref 30.0–36.0)
MCV: 85.3 fL (ref 78.0–100.0)
PLATELETS: 311 10*3/uL (ref 150–400)
RBC: 3.8 MIL/uL — AB (ref 3.87–5.11)
RDW: 18.6 % — AB (ref 11.5–15.5)
WBC: 8.5 10*3/uL (ref 4.0–10.5)

## 2016-03-15 LAB — BASIC METABOLIC PANEL
ANION GAP: 10 (ref 5–15)
BUN: 36 mg/dL — ABNORMAL HIGH (ref 6–20)
CALCIUM: 8.3 mg/dL — AB (ref 8.9–10.3)
CO2: 36 mmol/L — ABNORMAL HIGH (ref 22–32)
CREATININE: 0.72 mg/dL (ref 0.44–1.00)
Chloride: 90 mmol/L — ABNORMAL LOW (ref 101–111)
Glucose, Bld: 136 mg/dL — ABNORMAL HIGH (ref 65–99)
POTASSIUM: 3.5 mmol/L (ref 3.5–5.1)
Sodium: 136 mmol/L (ref 135–145)

## 2016-03-15 LAB — GLUCOSE, CAPILLARY
GLUCOSE-CAPILLARY: 98 mg/dL (ref 65–99)
GLUCOSE-CAPILLARY: 102 mg/dL — AB (ref 65–99)
GLUCOSE-CAPILLARY: 99 mg/dL (ref 65–99)
Glucose-Capillary: 115 mg/dL — ABNORMAL HIGH (ref 65–99)

## 2016-03-15 LAB — PHOSPHORUS: PHOSPHORUS: 3.4 mg/dL (ref 2.5–4.6)

## 2016-03-15 LAB — PROTIME-INR
INR: 1.06 (ref 0.00–1.49)
Prothrombin Time: 14 seconds (ref 11.6–15.2)

## 2016-03-15 LAB — MAGNESIUM: MAGNESIUM: 2 mg/dL (ref 1.7–2.4)

## 2016-03-15 MED ORDER — IOPAMIDOL (ISOVUE-300) INJECTION 61%
INTRAVENOUS | Status: AC
  Filled 2016-03-15: qty 50

## 2016-03-15 MED ORDER — GLUCAGON HCL (RDNA) 1 MG IJ SOLR
INTRAMUSCULAR | Status: AC | PRN
  Administered 2016-03-15: 1 mg via INTRAVENOUS

## 2016-03-15 MED ORDER — MIDAZOLAM HCL 2 MG/2ML IJ SOLN
INTRAMUSCULAR | Status: AC | PRN
  Administered 2016-03-15: 1 mg via INTRAVENOUS

## 2016-03-15 MED ORDER — FENTANYL CITRATE (PF) 100 MCG/2ML IJ SOLN
INTRAMUSCULAR | Status: AC | PRN
  Administered 2016-03-15 (×2): 50 ug via INTRAVENOUS

## 2016-03-15 MED ORDER — POTASSIUM CHLORIDE 10 MEQ/50ML IV SOLN
10.0000 meq | INTRAVENOUS | Status: AC
  Administered 2016-03-15 (×2): 10 meq via INTRAVENOUS
  Filled 2016-03-15: qty 50

## 2016-03-15 MED ORDER — LIDOCAINE HCL 1 % IJ SOLN
INTRAMUSCULAR | Status: AC
  Filled 2016-03-15: qty 20

## 2016-03-15 MED ORDER — CEFAZOLIN SODIUM-DEXTROSE 2-3 GM-% IV SOLR
INTRAVENOUS | Status: AC
Start: 2016-03-15 — End: 2016-03-16
  Filled 2016-03-15: qty 50

## 2016-03-15 MED ORDER — BISACODYL 10 MG RE SUPP
10.0000 mg | Freq: Once | RECTAL | Status: AC
  Administered 2016-03-16: 10 mg via RECTAL
  Filled 2016-03-15: qty 1

## 2016-03-15 MED ORDER — POTASSIUM CHLORIDE 20 MEQ/15ML (10%) PO SOLN: 20.0000 meq | ORAL | Status: DC

## 2016-03-15 MED ORDER — GLUCAGON HCL RDNA (DIAGNOSTIC) 1 MG IJ SOLR
INTRAMUSCULAR | Status: AC
  Filled 2016-03-15: qty 1

## 2016-03-15 MED ORDER — FENTANYL CITRATE (PF) 100 MCG/2ML IJ SOLN
25.0000 ug | INTRAMUSCULAR | Status: DC | PRN
  Administered 2016-03-16: 25 ug via INTRAVENOUS
  Administered 2016-03-16 – 2016-03-17 (×3): 100 ug via INTRAVENOUS
  Filled 2016-03-15 (×4): qty 2

## 2016-03-15 MED ORDER — FENTANYL CITRATE (PF) 100 MCG/2ML IJ SOLN
INTRAMUSCULAR | Status: AC
  Filled 2016-03-15: qty 2

## 2016-03-15 MED ORDER — IOPAMIDOL (ISOVUE-300) INJECTION 61%
50.0000 mL | Freq: Once | INTRAVENOUS | Status: AC | PRN
  Administered 2016-03-15: 10 mL via INTRAVENOUS

## 2016-03-15 MED ORDER — MIDAZOLAM HCL 2 MG/2ML IJ SOLN
INTRAMUSCULAR | Status: AC
  Filled 2016-03-15: qty 2

## 2016-03-15 NOTE — Progress Notes (Signed)
Physical Therapy Treatment Patient Details Name: Kendra Osborne MRN: [id number] DOB: [birth year] Today's Date: 03/15/2016    History of Present Illness pt presents with Large thyroid mass/goiter and Respiratory Difficulties.  pt has underwent Total Thyroidectomy with goiter removal and has remained intubated with one failed extubation on 3/24.  pt recently came to the [place] from [place] and only known medical hx is HTN and Goiter.      PT Comments    Pt lethargic and difficult to get to participate today.  Pt able to tolerate sitting EOB ~76mins, but Max A to maintain sitting.  Pt on trach collar throughout session ans tolerated well with no increase in RR from rest (30s) and all other VSS.  Will continue to follow.    Follow Up Recommendations  CIR     Equipment Recommendations  None recommended by PT    Recommendations for Other Services       Precautions / Restrictions Precautions Precautions: Fall Precaution Comments: trach collar  Restrictions Weight Bearing Restrictions: No    Mobility  Bed Mobility Overal bed mobility: Needs Assistance;+2 for physical assistance Bed Mobility: Rolling;Supine to Sit;Sit to Supine Rolling: Total assist;+2 for physical assistance   Supine to sit: Total assist;+2 for physical assistance Sit to supine: Total assist;+2 for physical assistance   General bed mobility comments: Pt pushing heavily to the Rt (towards the bed) when attempting to lift trunk from bed.  Does not attempt to assist with any aspects of bed mobility   Transfers                    Ambulation/Gait                 Stairs            Wheelchair Mobility    Modified Rankin (Stroke Patients Only)       Balance Overall balance assessment: Needs assistance Sitting-balance support: No upper extremity supported;Feet supported Sitting balance-Leahy Scale: Poor Sitting balance - Comments: pt required Max A to maintain balance today.                             Cognition Arousal/Alertness: Lethargic Behavior During Therapy: Flat affect Overall Cognitive Status: Difficult to assess                      Exercises      General Comments        Pertinent Vitals/Pain Pain Assessment: Faces Faces Pain Scale: Hurts little more Pain Location: Grimaces during any mobility Pain Descriptors / Indicators: Grimacing Pain Intervention(s): Limited activity within patient's tolerance;Monitored during session;Premedicated before session;Repositioned    Home Living                      Prior Function            PT Goals (current goals can now be found in the care plan section) Acute Rehab PT Goals Patient Stated Goal: Unable to state. PT Goal Formulation: Patient unable to participate in goal setting Time For Goal Achievement: 03/19/16 Potential to Achieve Goals: Good Progress towards PT goals: Progressing toward goals    Frequency  Min 3X/week    PT Plan Current plan remains appropriate    Co-evaluation             End of Session Equipment Utilized During Treatment:  (Trach Collar) Activity Tolerance: Patient limited by lethargy  Patient left: in bed;with call bell/phone within reach;with family/visitor present (Mitts re-applied)     Time: [id number] PT Time Calculation (min) (ACUTE ONLY): 22 min  Charges:  $Therapeutic Activity: 8-22 mins                    G CodesSunny Osborne:      Kendra Osborne, [place]PT [phone] 03/15/2016, 3:08 PM

## 2016-03-15 NOTE — Progress Notes (Signed)
eLink Physician-Brief Progress Note Patient Name: Juliane Pootaseem Brogden DOB: 06/25/1946 MRN: [id number]   Date of Service  03/15/2016  HPI/Events of Note  Patient had Gastrostomy placed by IR today and now c/o pain.   eICU Interventions  Will have the NP evaluate the patient at the bedside prior to medicating for pain.      Intervention Category Intermediate Interventions: Pain - evaluation and management  Loran Auguste Dennard Nipugene 03/15/2016, 9:19 PM

## 2016-03-15 NOTE — Progress Notes (Signed)
Speech Language Pathology Treatment: Kendra Osborne Speaking valve  Patient Details Name: Kendra Osborne MRN: [id number] DOB: 12/30/1945 Today's Date: 03/15/2016 Time: 4098-11910907-0922 SLP Time Calculation (min) (ACUTE ONLY): 15 min  Assessment / Plan / Recommendation Clinical Impression  Pt alert and unable to obtain functional phonation using valve despite verbal and visual cues. SpO2, HR remained in normal limits. RR mostly below 33 with brief spikes to 42. Valve remained on trach hub and intermittently removed without indications of back pressure. Pt may have increased success with a cuffless trach and smaller size if able. Plans for PEG today. Will continue to see for PMSV and dysphagia.   HPI HPI: 70 y.o. woman who recently moved from [name] ([place] known PMHx) who presented to ED with respiratory distress and lower extremity edema. Pt was intubated 3/17 and failed to wean, therefore trach placed. CXR 3/31 stable bibasilar atelectasis.       SLP Plan  Continue with current plan of care     Recommendations         Patient may use Passy-Osborne Speech Valve: with SLP only PMSV Supervision: Full MD: Please consider changing trach tube to : Smaller size;Cuffless      Oral Care Recommendations: Oral care QID Follow up Recommendations:  (TBD) Plan: Continue with current plan of care     GO                Kendra Osborne, Kendra Osborne 03/15/2016, 4:16 PM  Kendra Osborne M.Ed [employer]-SLP Pager [phone]

## 2016-03-15 NOTE — Progress Notes (Signed)
PULMONARY / CRITICAL CARE MEDICINE    Name: Kendra Osborne MRN: [id number] DOB: 12/27/1945    ADMISSION DATE:  02/24/2016  PRIMARY SERVICE: PCCM  CHIEF COMPLAINT:  Dyspnea  BRIEF PATIENT DESCRIPTION: 70 y/o woman recently moved from [name] who presents with respiratory distress. She presented to the ED on 3/17 with complaints of dyspnea, and lower extremity edema. History is limited due to language / culture barriers, but she has a PMHx of a known goiter that has been present for years, as well as possibly hypertension. It is not clear if the goiter was ever worked up or if she ever was on regular therapy for her hypertension. Her Nephew and son (who speaks little [place]) report that she been having a cough for a few days as well as some fevers and chills.  SUBJECTIVE:    TRach collar  For peg  VITAL SIGNS: Temp:  [97.5 F (36.4 C)-98.7 F (37.1 C)] 97.5 F (36.4 C) (04/06 0800) Pulse Rate:  [63-113] 65 (04/06 0730) Resp:  [22-49] 34 (04/06 0730) BP: (97-164)/(48-92) 109/60 mmHg (04/06 0730) SpO2:  [93 %-100 %] 97 % (04/06 0730) FiO2 (%):  [28 %-35 %] 35 % (04/06 0730) HEMODYNAMICS:   VENTILATOR SETTINGS: Vent Mode:  [-]  FiO2 (%):  [28 %-35 %] 35 %  INTAKE / OUTPUT: Intake/Output      04/05 0701 - 04/06 0700 04/06 0701 - 04/07 0700   I.V. (mL/kg) 10 (0.1)    NG/GT 2155    IV Piggyback     Total Intake(mL/kg) 2165 (21.8)    Urine (mL/kg/hr) 1805 (0.8)    Total Output 1805     Net +360           PHYSICAL EXAMINATION: General: No acute distress.  Trach in place.   Integument:  Warm & dry. No rash on exposed skin. Surgical incision on neck clean & dry. HEENT:  Trach in place Cardiovascular:  s1 s2 RRR Pulmonary:  CTA  Abdomen: Soft. Normal bowel sounds. Nondistended.  Neurological: Spontaneously moving all 4 extremities. Eyes open. Appears grossly nonfocal.  LABS:  CBC  Recent Labs Lab 03/13/16 0411 03/14/16 0415 03/15/16 0421  WBC 9.2 8.0 8.5  HGB 9.2*  9.6* 9.7*  HCT 31.4* 31.7* 32.4*  PLT 331 310 311   Coag's  Recent Labs Lab 03/15/16 0421  INR 1.06   BMET  Recent Labs Lab 03/13/16 0411 03/13/16 1914 03/14/16 0415 03/15/16 0421  NA 135  --  138 136  K 3.7 4.1 3.8 3.5  CL 95*  --  92* 90*  CO2 28  --  35* 36*  BUN 36*  --  33* 36*  CREATININE 0.89  --  0.78 0.72  GLUCOSE 142*  --  135* 136*   Electrolytes  Recent Labs Lab 03/13/16 0411 03/13/16 1914 03/14/16 0415 03/15/16 0421  CALCIUM 7.8*  --  8.1* 8.3*  MG 1.7 2.1 2.2 2.0  PHOS 2.4*  --  4.6 3.4   Sepsis Markers No results for input(s): LATICACIDVEN, PROCALCITON, O2SATVEN in the last 168 hours. ABG  Recent Labs Lab 03/09/16 0406  PHART 7.404  PCO2ART 54.6*  PO2ART 71.0*   Liver Enzymes  Recent Labs Lab 03/09/16 0518 03/10/16 0525 03/12/16 0555  ALBUMIN 2.2* 2.5* 2.2*   Cardiac Enzymes No results for input(s): TROPONINI, PROBNP in the last 168 hours. Glucose  Recent Labs Lab 03/14/16 0823 03/14/16 1208 03/14/16 1549 03/14/16 1933 03/14/16 2307 03/15/16 0745  GLUCAP 132* 116* 109* 125* 117*  115*   Imaging No results found. SIGNIFICANT EVENTS: 3/17 - Admit 3/21 - Thyroidectomy 3/24 - Extubation & reintubation within 2 hours due to hypoxia & secretions 4/6- 24 hr trach collar  STUDIES:  CT-A of Chest >> mild mediastinal enlargement CT of the Neck with contrast >> multinodular goiter, meningioma Port CXR 3/22:  ETT & CVL in good position. Blunting bilateral costophrenic angles suggestive of effusions. Bilateral hilar fullness & interstitial prominence with low lung volumes.  Port CXR 3/25:. Silhouetting of the left hemidiaphragm. Platelike atelectasis right lower lung. Endotracheal tube in good position.  LINES / TUBES: OETT 7.5 3/17 - 3/24; 7.0 3/24>>>3/31 Trach (ENT) 3/31>>> L Subclavian CVL 3/21>>> L Port 3/21>>> OGT 3/21 - 3/24; 3/24>>> Foley 3/18>>> PIV x1 L Radial Art Line 3/21 - 3/23 R & L Neck Drains 3/21 -  ???  MICROBIOLOGY: Tracheal Asp Ctx 3/25>>> ng Blood Ctx x2 3/18:  Negative  MRSA PCR 3/18:  Negative   ANTIBIOTICS: Unasyn 3/24>>> Ancef 3/21 - 3/24 Clindamycin 3/21 (only 1 dose) Azithromycin 3/18 - 3/19 Rocephin 3/18 - 3/20  ASSESSMENT / PLAN:  PULMONARY A: Acute on Chronic Hypercarbic Respiratory Failure Acute Hypoxic Respiratory Failure - Question element of pulmonary edema vs viral bronchitis/pneumonitis. Extrinsic Airway Compression - Secondary to goiter. S/P total thyroidectomy 3/21.  P:   Trach collar maintain  Consider dc vent from room PMV needed, attempting Drop cuff trach  CARDIOVASCULAR A: HTN - TTE 3/19 nml LV fn, gr 1 DD.  P:   Hydralazine IV PRN, not needed Lasix tele  RENAL A: Hypokalemia - Replacing. Hypernatremia - Mild but stable. Hypomagnesemia - Resolved. Hyponatremia - Resolved. Acute Renal Failure - Resolved.  P:   kvo Dc free water. k supp to IV lasix  GASTROINTESTINAL A: dyspahgia  P:   Protonix daily. TF held For peg  HEMATOLOGIC A: Anemia - Mild.  P:   Limit phlebotomy SCDs. Continue Heparin [place] q8hr.  INFECTIOUS A: CAP vs Aspiration Pneumonia-treated P:   Monitor for fever & leukocytosis.  ENDOCRINE A: S/P Total Thyroidectomy - Previously normal TSH.  P:   Monitor calcium level w/ daily labs. Synthroid VT daily  tsh in 6 weeks  NEUROLOGIC A: Post-Op Pain  P:   RASS Goal: 0  Versed IV prn Klonipin to 2 mg PO BID, consider slow reduction  Seroquel to 25 mg TID, could escalate if n eeded Continue fentanyl patch 75 mcg and off fentanyl drip.  To [facility], sdu   Mcarthur Rossetti. Tyson Alias, MD, FACP Pgr: [phone] [facility]

## 2016-03-15 NOTE — Sedation Documentation (Signed)
Patient is resting comfortably. 

## 2016-03-15 NOTE — Progress Notes (Signed)
PCCM INTERVAL PROGRESS NOTE  To bedside to evaluate patient with abdominal pain s/p PEG placement today. She does not speak english, however, face shows great distress and grimace, worse with palpation. Abdomen soft on exam.  Plan: Will order PRN fentanyl in addition to patch  Joneen RoachPaul Hoffman, AGACNP-BC [facility] Pager [phone] or [phone]  03/15/2016 10:07 PM

## 2016-03-15 NOTE — Progress Notes (Signed)
[facility] FOR AM LAB REPLACEMENT ONLY  The patient does apply for the [facility] Electrolyte Replacment Protocol based on the criteria listed below:   1. Is GFR >/= 40 ml/min? Yes.    Patient's GFR today is >60 2. Is urine output >/= 0.5 ml/kg/hr for the last 6 hours? Yes.   Patient's UOP is 0.92 ml/kg/hr 3. Is BUN < 60 mg/dL? Yes.    Patient's BUN today is 36 4. Abnormal electrolyte(s):Potassium 3.5 5. Ordered repletion with: Potassium   Henrique Parekh P 03/15/2016 6:04 AM

## 2016-03-15 NOTE — Procedures (Signed)
20 Fr G tube pull through No comp/EBL

## 2016-03-16 ENCOUNTER — Encounter (HOSPITAL_COMMUNITY): Payer: Self-pay | Admitting: Internal Medicine

## 2016-03-16 DIAGNOSIS — E876 Hypokalemia: Secondary | ICD-10-CM | POA: Diagnosis present

## 2016-03-16 DIAGNOSIS — I10 Essential (primary) hypertension: Secondary | ICD-10-CM

## 2016-03-16 DIAGNOSIS — J9602 Acute respiratory failure with hypercapnia: Secondary | ICD-10-CM | POA: Diagnosis present

## 2016-03-16 DIAGNOSIS — R5381 Other malaise: Secondary | ICD-10-CM

## 2016-03-16 DIAGNOSIS — J9811 Atelectasis: Secondary | ICD-10-CM | POA: Diagnosis present

## 2016-03-16 DIAGNOSIS — E890 Postprocedural hypothyroidism: Secondary | ICD-10-CM | POA: Diagnosis present

## 2016-03-16 DIAGNOSIS — I5032 Chronic diastolic (congestive) heart failure: Secondary | ICD-10-CM | POA: Diagnosis present

## 2016-03-16 DIAGNOSIS — Z90710 Acquired absence of both cervix and uterus: Secondary | ICD-10-CM | POA: Diagnosis present

## 2016-03-16 DIAGNOSIS — E049 Nontoxic goiter, unspecified: Secondary | ICD-10-CM | POA: Diagnosis present

## 2016-03-16 DIAGNOSIS — E870 Hyperosmolality and hypernatremia: Secondary | ICD-10-CM | POA: Diagnosis present

## 2016-03-16 LAB — BASIC METABOLIC PANEL
ANION GAP: 10 (ref 5–15)
BUN: 38 mg/dL — ABNORMAL HIGH (ref 6–20)
CALCIUM: 8.3 mg/dL — AB (ref 8.9–10.3)
CO2: 35 mmol/L — ABNORMAL HIGH (ref 22–32)
Chloride: 90 mmol/L — ABNORMAL LOW (ref 101–111)
Creatinine, Ser: 0.75 mg/dL (ref 0.44–1.00)
GLUCOSE: 133 mg/dL — AB (ref 65–99)
POTASSIUM: 5.4 mmol/L — AB (ref 3.5–5.1)
SODIUM: 135 mmol/L (ref 135–145)

## 2016-03-16 LAB — GLUCOSE, CAPILLARY
GLUCOSE-CAPILLARY: 142 mg/dL — AB (ref 65–99)
GLUCOSE-CAPILLARY: 143 mg/dL — AB (ref 65–99)
GLUCOSE-CAPILLARY: 145 mg/dL — AB (ref 65–99)
Glucose-Capillary: 130 mg/dL — ABNORMAL HIGH (ref 65–99)
Glucose-Capillary: 115 mg/dL — ABNORMAL HIGH (ref 65–99)
Glucose-Capillary: 128 mg/dL — ABNORMAL HIGH (ref 65–99)

## 2016-03-16 LAB — MAGNESIUM: MAGNESIUM: 2.2 mg/dL (ref 1.7–2.4)

## 2016-03-16 LAB — POTASSIUM: POTASSIUM: 3.9 mmol/L (ref 3.5–5.1)

## 2016-03-16 MED ORDER — PRO-STAT SUGAR FREE PO LIQD
30.0000 mL | Freq: Every day | ORAL | Status: DC
  Administered 2016-03-16: 30 mL
  Filled 2016-03-16 (×2): qty 30

## 2016-03-16 MED ORDER — QUETIAPINE FUMARATE 25 MG PO TABS
12.5000 mg | ORAL_TABLET | Freq: Three times a day (TID) | ORAL | Status: DC
  Administered 2016-03-16: 12.5 mg via ORAL
  Filled 2016-03-16 (×3): qty 1

## 2016-03-16 MED ORDER — VITAL AF 1.2 CAL PO LIQD
1000.0000 mL | ORAL | Status: DC
  Administered 2016-03-16: 1000 mL
  Filled 2016-03-16 (×3): qty 1000

## 2016-03-16 MED ORDER — CLONAZEPAM 0.5 MG PO TBDP
1.0000 mg | ORAL_TABLET | Freq: Two times a day (BID) | ORAL | Status: DC
  Filled 2016-03-16 (×2): qty 2

## 2016-03-16 NOTE — Progress Notes (Signed)
Occupational Therapy Treatment Patient Details Name: Kendra Osborne MRN: [id number] DOB: 06/14/1946 Today's Date: 03/16/2016    History of present illness pt presents with Large thyroid mass/goiter and Respiratory Difficulties.  pt has underwent Total Thyroidectomy with goiter removal and has remained intubated with one failed extubation on 3/24.  [name] and only known medical hx is HTN and Goiter.     OT comments  Pt more alert than she was on Wed.  She continues to require mod - max A for EOB sitting (was min guard assist prior to trach placement).  She demonstrates heavy right lean, and does not attempt to correct posture - RN notified.   Follow Up Recommendations  CIR;Supervision/Assistance - 24 hour    Equipment Recommendations  3 in 1 bedside comode    Recommendations for Other Services      Precautions / Restrictions Precautions Precautions: Fall Precaution Comments: trach collar        Mobility Bed Mobility Overal bed mobility: Needs Assistance Bed Mobility: Rolling;Supine to Sit;Sit to Supine Rolling: Total assist   Supine to sit: Max assist;+2 for physical assistance Sit to supine: Max assist;+2 for physical assistance   General bed mobility comments: Pt assisted minimally with lifting trunk from bed   Transfers                 General transfer comment: unable to attempt     Balance Overall balance assessment: Needs assistance Sitting-balance support: Feet supported Sitting balance-Leahy Scale: Poor Sitting balance - Comments: Pt requires max A to maintain EOB sitting. She presents with Rt lean.  She will correct with max A/faciliation, and can sit with min A briefly before leaning to Rt and requiring max A.  Postural control: Right lateral lean;Other (comment) (forward ) Standing balance support: Single extremity supported                       ADL Overall ADL's : Needs assistance/impaired     Grooming:  Wash/dry face;Maximal assistance;Sitting Grooming Details (indicate cue type and reason): Pt requires max A to move Rt UE toward face                                       Vision                     Perception     Praxis      Cognition   Behavior During Therapy: Flat affect Overall Cognitive Status: Difficult to assess                       Extremity/Trunk Assessment               Exercises     Shoulder Instructions       General Comments      Pertinent Vitals/ Pain       Pain Assessment: Faces Faces Pain Scale: Hurts even more Pain Location: unable to determine.  Pt grimaces frequently with movement  Pain Descriptors / Indicators: Grimacing Pain Intervention(s): Monitored during session;Limited activity within patient's tolerance  Home Living  Prior Functioning/Environment              Frequency Min 2X/week     Progress Toward Goals  OT Goals(current goals can now be found in the care plan section)  Progress towards OT goals: Progressing toward goals (slowly )  ADL Goals Pt Will Perform Grooming: with min assist;standing Pt Will Perform Upper Body Bathing: with supervision;sitting Pt Will Perform Lower Body Bathing: with min assist;sit to/from stand Pt Will Perform Upper Body Dressing: with min assist;sitting Pt Will Perform Lower Body Dressing: with min assist;sit to/from stand Pt Will Transfer to Toilet: with min assist;ambulating;regular height toilet;bedside commode;grab bars Pt Will Perform Toileting - Clothing Manipulation and hygiene: with min assist;sit to/from stand Pt/caregiver will Perform Home Exercise Program: Increased strength;Both right and left upper extremity;With theraband;With Supervision;With written HEP provided  Plan Discharge plan remains appropriate    Co-evaluation                 End of Session Equipment Utilized During  Treatment: Oxygen   Activity Tolerance Patient limited by pain   Patient Left in bed;with call bell/phone within reach;with bed alarm set   Nurse Communication Mobility status;Need for lift equipment        Time: 1310-1339 OT Time Calculation (min): 29 min  Charges: OT General Charges $OT Visit: 1 Procedure OT Treatments $Therapeutic Activity: 23-37 mins  Inell Mimbs M 03/16/2016, 5:17 PM

## 2016-03-16 NOTE — Progress Notes (Signed)
S/p perc G-tube yesterday. C/o abd pain, but now finally having BMs, had been constipated per RN  BP 165/69 mmHg  Pulse 93  Temp(Src) 97.8 F (36.6 C) (Axillary)  Resp 29  Ht 5' (1.524 m)  Wt 208 lb 15.9 oz (94.8 kg)  BMI 40.82 kg/m2  SpO2 93% G-tube intact, site clean. No leak. TF have already been started.  Call IR with any G-tube related issues.   Brayton ElKevin Koralee Wedeking PA-C Interventional Radiology 03/16/2016 9:30 AM

## 2016-03-16 NOTE — Progress Notes (Signed)
Nutrition Follow-up  DOCUMENTATION CODES:   Morbid obesity  INTERVENTION:   D/C Vital High Protein Initiate Vital AF 1.2 @ 50 ml/hr Provides: 1540 kcal, 105 grams protein, and 973 ml H2O.   NUTRITION DIAGNOSIS:   Inadequate oral intake related to inability to eat as evidenced by NPO status. Ongoing.   GOAL:   Provide needs based on ASPEN/SCCM guidelines Met.   MONITOR:   I & O's, Skin, TF tolerance, Labs  ASSESSMENT:   70 y/o woman recently moved from [place] who presents with respiratory distress. 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  4/6 PEG placed, pt on trach collar for 24 hours 3/31 trach placed  Labs reviewed: potassium elevated 5.4 CBG's: 115-142 Weight is down from 230 lb to 208 lb, pt is - 10L.   Diet Order:     NPO  Skin:  Wound (see comment) (closed incision on neck)  Last BM:  4/7  Height:   Ht Readings from Last 1 Encounters:  02/24/16 5' (1.524 m)    Weight:   Wt Readings from Last 1 Encounters:  03/16/16 208 lb 15.9 oz (94.8 kg)    Ideal Body Weight:  45.45 kg  BMI:  Body mass index is 40.82 kg/(m^2).  Estimated Nutritional Needs:   Kcal:  1500-1600  Protein:  100-115 grams  Fluid:  >/= 1.5 L  EDUCATION NEEDS:   No education needs identified at this time  [place], [place], [place] Pager [phone] After Hours Pager

## 2016-03-16 NOTE — Progress Notes (Signed)
Utilization review complete. Alleta Avery RN CCM Case Mgmt phone [phone] 

## 2016-03-16 NOTE — Progress Notes (Signed)
[facility] TEAM 1 - Stepdown/ICU TEAM Progress Note  Kendra Osborne [id number] DOB: 06/10/1946 DOA: 02/24/2016 PCP: No PCP Per Patient  Admit HPI / Brief Narrative: 70 y/o woman recently moved from [name] who presents with respiratory distress. She presented to the ED on 3/17 with complaints of dyspnea, and lower extremity edema. History is limited due to language / culture barriers, but she has a PMHx of a known goiter that has been present for years, as well as possibly hypertension. It is not clear if the goiter was ever worked up or if she ever was on regular therapy for her hypertension. Her Nephew and son (who speaks little [place]) report that she been having a cough for a few days as well as some fevers and chills.   HPI/Subjective: 4/7 patient extremely groggy, difficult to arouse. Appeared confused. Unfortunately also a language barrier. Per RN Herbert SetaHeather every time patient receives her Klonopin and Seroquel becomes extremely confused, groggy. In addition patient received moderate sedation on 4/6 for placement of gastrostomy tube   Assessment/Plan: Acute on Chronic respiratory failure with hypercarbia/CAP vs Aspiration pneumonia -Completed course of antibiotics? -Multifactorial to include element of pulmonary edema vs viral bronchitis/pneumonitis,extrinsic airway compression, and iatrogenic (oversedation). -Decrease clonazepam 1 mg BID -DC Versed -Decrease Seroquel 12.5 mg  TID -Minimize all sedating medication -Continue trach collar -ABG in A.m. -PCXR 4/8  Extrinsic Airway Compression Secondary to goiter -Resolved S/P total thyroidectomy 3/21.  HTN -Patient relatively hypotensive hold all scheduled BP medication -Hydralazine IV PRN,   Chronic diastolic CHF -Strict in and out since admission -11L -Daily weight Filed Weights   03/14/16 0458 03/15/16 1440 03/16/16 0500  Weight: 99.3 kg (218 lb 14.7 oz) 95 kg (209 lb 7 oz) 94.8 kg (208 lb 15.9 oz)    Hypokalemia    -Resolved continue to monitor closely  Hypernatremia -Resolved   Hypomagnesemia  -Resolved  Hyponatremia  - Resolved.  Acute Renal Failure  - Resolved.  Dyspahgia -Nothing by mouth until patient's cognition significantly improves and can pass swallow study -Feedings via PEG tube  Postoperative Hypothyroidism -S/P Total Thyroidectomy - Previously normal TSH. -Synthroid 100mcg daily - Recheck TSH in 6 weeks    Code Status: FULL Family Communication: no family present at time of exam Disposition Plan: SNF?    Consultants: Dr.Mitchell Gore ENT Dr.Arthur Hoss IR Dr.Daniel Daneil DanJ Feinstein PCCM   Procedure/Significant Events: 3/21 - Thyroidectomy 3/24 - Extubation & reintubation within 2 hours due to hypoxia & secretions 4/6- 24 hr trach collar CT-A of Chest >> mild mediastinal enlargement CT of the Neck with contrast >> multinodular goiter, meningioma Port CXR 3/22: ETT & CVL in good position. Blunting bilateral costophrenic angles suggestive of effusions. Bilateral hilar fullness & interstitial prominence with low lung volumes.  3/19 echocardiogram;- Left ventricle: moderate LVH. -LVEF=60%-65%. -(grade 1 diastolic dysfunction). - Left atrium:  moderately dilated. - Systemic veins: IVC measured 2.3 cm with < 50% respirophasic  variation, suggesting RA pressure 15 mmHg. Port CXR 3/25:. Silhouetting of the left hemidiaphragm. Platelike atelectasis right lower lung. Endotracheal tube in good position 4/6.IR GASTROSTOMY TUBE placed 4/7tracheostomy tube change ;old 6 cuffed proximal XLT shiley was removed with the old silk sutures, and a new 6 cuffless proximal XLT shiley was placed    Culture Tracheal Asp Ctx 3/25>>> ng Blood Ctx x2 3/18: Negative  MRSA PCR 3/18: Negative    Antibiotics: Unasyn 3/24>>> Ancef 3/21 - 3/24 Clindamycin 3/21 (only 1 dose) Azithromycin 3/18 - 3/19 Rocephin 3/18 - 3/20  DVT prophylaxis: SCD   Devices    LINES / TUBES:   OETT 7.5 3/17 - 3/24; 7.0 3/24>>>3/31 Trach (ENT) 3/31>>> L Subclavian CVL 3/21>>> L Port 3/21>>> OGT 3/21 - 3/24; 3/24>>> Foley 3/18>>> PIV x1 L Radial Art Line 3/21 - 3/23 R & L Neck Drains 3/21 - ???     Continuous Infusions: . feeding supplement (VITAL HIGH PROTEIN) 1,000 mL (03/16/16 0600)    Objective: VITAL SIGNS: Temp: 98.6 F (37 C) (04/07 0400) Temp Source: Oral (04/07 0400) BP: 165/69 mmHg (04/07 0620) Pulse Rate: 93 (04/07 0756) SPO2; FIO2:   Intake/Output Summary (Last 24 hours) at 03/16/16 0858 Last data filed at 03/16/16 0600  Gross per 24 hour  Intake 1656.5 ml  Output   1830 ml  Net -173.5 ml     Exam: General: patient extremely groggy, difficult to arouse, No acute respiratory distress Eyes: Negative headache, negative scleral hemorrhage ENT: Negative Runny nose, negative gingival bleeding,  Neck:  Negative scars, masses, torticollis, lymphadenopathy, JVD, trach collar in place negative sign of infection Lungs: tachypnea distant breath sounds, poor air movement, negative wheezes or crackles Cardiovascular: Regular rate and rhythm without murmur gallop or rub normal S1 and S2 Abdomen: Morbidly obese, negative abdominal pain, nondistended, positive soft, bowel sounds, no rebound, no ascites, no appreciable mass, PEG tube in place Extremities: No significant cyanosis, clubbing, or edema bilateral lower extremities Psychiatric:  Unable to evaluate secondary to sedation  Neurologic:  Unable to evaluate secondary to sedation   Data Reviewed: Basic Metabolic Panel:  Recent Labs Lab 03/11/16 0640 03/12/16 0555 03/13/16 0411 03/13/16 1914 03/14/16 0415 03/15/16 0421 03/16/16 0500  NA 140 139 135  --  138 136 135  K 3.8 3.3* 3.7 4.1 3.8 3.5 5.4*  CL 98* 96* 95*  --  92* 90* 90*  CO2 31 32 28  --  35* 36* 35*  GLUCOSE 130* 123* 142*  --  135* 136* 133*  BUN 22* 33* 36*  --  33* 36* 38*  CREATININE 1.03* 1.05* 0.89  --  0.78 0.72 0.75   CALCIUM 8.4* 7.9* 7.8*  --  8.1* 8.3* 8.3*  MG 1.9 1.7 1.7 2.1 2.2 2.0 2.2  PHOS 3.7 3.6 2.4*  --  4.6 3.4  --    Liver Function Tests:  Recent Labs Lab 03/10/16 0525 03/12/16 0555  ALBUMIN 2.5* 2.2*   No results for input(s): LIPASE, AMYLASE in the last 168 hours. No results for input(s): AMMONIA in the last 168 hours. CBC:  Recent Labs Lab 03/10/16 0525 03/11/16 0640 03/12/16 0555 03/13/16 0411 03/14/16 0415 03/15/16 0421  WBC 13.2* 11.2* 10.5 9.2 8.0 8.5  NEUTROABS 8.8* 7.0 7.1  --   --   --   HGB 11.6* 10.6* 9.6* 9.2* 9.6* 9.7*  HCT 38.0 35.2* 32.5* 31.4* 31.7* 32.4*  MCV 84.4 84.6 85.3 85.8 87.3 85.3  PLT 418* 383 362 331 310 311   Cardiac Enzymes: No results for input(s): CKTOTAL, CKMB, CKMBINDEX, TROPONINI in the last 168 hours. BNP (last 3 results)  Recent Labs  02/24/16 2000  BNP 983.4*    ProBNP (last 3 results) No results for input(s): PROBNP in the last 8760 hours.  CBG:  Recent Labs Lab 03/15/16 1116 03/15/16 1538 03/15/16 2017 03/16/16 0001 03/16/16 0414  GLUCAP 98 102* 99 143* 130*    No results found for this or any previous visit (from the past 240 hour(s)).   Studies:  Recent x-ray studies have been reviewed in  detail by the Attending Physician  Scheduled Meds:  Scheduled Meds: . antiseptic oral rinse  7 mL Mouth Rinse q12n4p  . bacitracin   Topical TID  . chlorhexidine  15 mL Mouth Rinse BID  . clonazepam  2 mg Per Tube BID  . fentaNYL  75 mcg Transdermal Q72H  . furosemide  40 mg Intravenous Q12H  . heparin subcutaneous  5,000 Units Subcutaneous 3 times per day  . levothyroxine  100 mcg Oral QAC breakfast  . pantoprazole sodium  40 mg Per Tube Daily  . QUEtiapine  25 mg Oral TID  . sodium chloride flush  10-40 mL Intracatheter Q12H    Time spent on care of this patient: 40 mins   WOODS, Roselind Messier , MD  [facility] Office  [phone] Pager - [phone]  On-Call/Text Page:      [email or web address]       password [email or web address]  If 7PM-7AM, please contact night-coverage [email or web address] Password [email or web address] 03/16/2016, 8:58 AM   LOS: 20 days   Care during the described time interval was provided by me .  I have reviewed this patient's available data, including medical history, events of note, physical examination, and all test results as part of my evaluation. I have personally reviewed and interpreted all radiology studies.   Carolyne Littles, MD [phone] Pager

## 2016-03-16 NOTE — Progress Notes (Signed)
Inpatient Rehabilitation  Per IP Rehab MD consult patient not demonstrating the ability to tolerate intensive therapies at this time; see consult for full details. Plan to follow along and monitor for medical readiness as well as tolerance.  My co-worker Weldon PickingSusan Blankenship will follow up Monday, 03/19/16.    Charlane FerrettiMelissa Carine Nordgren, M.A., CCC/SLP Admission Coordinator  [facility]  Cell [phone]

## 2016-03-16 NOTE — Consult Note (Signed)
Physical Medicine and Rehabilitation Consult  Reason for Consult:  VDRF with Debility Referring Physician: Dr. Tyson Alias   HPI: Kendra Osborne is a 70 y.o. non-english speaking [place] female with history neck mass for years who was admitted on 02/24/16 with respiratory distress, BLE edema and neck swelling. Patient emigrated 3 weeks PTA and reported to be having symptoms for few days. She was intubated for acute on chronic hypercarbic respiratory failure due to extrinsic compression and started on antibiotics due to question of CAP v/s aspiration PNA.  CTA chest negative for PE but showed marked thyroid enlargement and cholelithiasis. 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 2D echo showed EF 60-65% with moderate LVH adn moderate LAE. Dr. Emeline Darling consulted for assistance and patient underwent total thyroidectomy on 02/28/16. Pathology consistent with thyroid goiter. Calcium levels stable but she had difficulty with vent wean requiring tracheostomy on 3/31 and has been weaned to ATC. Blood pressures have been elevated and pain managed with use of fentanyl. Swallow evaluation done showing evidence of severe oropharyngeal dysphagia and PEG placed by Dr. Bonnielee Haff on 04/06.   Therapy ongoing and patient limited by pain as well as lethargy. CIR recommended by rehab team.   [name] downsized to XL 6 at bedside by Dr. Emeline Darling today--to keep #6 (does not want downsize to 4)  in place till follow up in office. Attempted to use son and interpreter line for exam but patient distracted--pointing to head and neck for pain and miming wanting something by mouth.  Son reports that he hasn't seen mother in 10+ years but she was active PTA and no significant health problems known.    Review of Systems  Unable to perform ROS: patient nonverbal  Respiratory: Positive for cough and sputum production.   Neurological: Positive for  headaches.      Past Medical History  Diagnosis Date  . Hypertension     Past Surgical History  Procedure Laterality Date  . Thyroidectomy N/A 02/28/2016    Procedure:  TOTAL THYROIDECTOMY WITH NIMS MONITOR;  Surgeon: Melvenia Beam, MD;  Location: [facility] OR;  Service: ENT;  Laterality: N/A;  . Tracheostomy tube placement N/A 03/09/2016    Procedure: TRACHEOSTOMY;  Surgeon: Melvenia Beam, MD;  Location: [facility] OR;  Service: ENT;  Laterality: N/A;    History reviewed. No pertinent family history.    Social History:  Lives with son (not working but in and out) and daughter-in-law (works).  Independent PTA. Per reports that she has never smoked. She does not have any smokeless tobacco history on file. Per  reports that she does not drink alcohol or use illicit drugs.    Allergies: Not on File    Medications Prior to Admission  Medication Sig Dispense Refill  . acetaminophen (TYLENOL) 650 MG CR tablet Take 650 mg by mouth 2 (two) times daily as needed for pain.    Marland Kitchen aspirin EC 81 MG tablet Take 81 mg by mouth daily as needed (for high blood pressure).    . naproxen sodium (ANAPROX) 220 MG tablet Take 220 mg by mouth daily as needed (for knee pain).    Marland Kitchen PRESCRIPTION MEDICATION Take 1 tablet by mouth daily as needed (for high blood pressure).      Home: Home Living Family/patient expects to be discharged to:: Unsure Additional Comments: No family present to discuss D/C plan and home set-up.  Functional History: Prior Function Comments:  Unsure as no family present and pt unable to communicate. Functional Status:  Mobility: Bed Mobility Overal bed mobility: Needs Assistance, +2 for physical assistance Bed Mobility: Rolling, Supine to Sit, Sit to Supine Rolling: Total assist, +2 for physical assistance Supine to sit: Total assist, +2 for physical assistance Sit to supine: Total assist, +2 for physical assistance General bed mobility comments: Pt pushing heavily to the Rt (towards the  bed) when attempting to lift trunk from bed.  Does not attempt to assist with any aspects of bed mobility  Transfers General transfer comment: unable to safely attempt - recommend use of lift equipment       ADL: ADL Overall ADL's : Needs assistance/impaired Eating/Feeding: NPO Grooming: Wash/dry face, Minimal assistance, Sitting Upper Body Bathing: Maximal assistance, Sitting Lower Body Bathing: Total assistance, Bed level Upper Body Dressing : Total assistance, Sitting Lower Body Dressing: Total assistance, Bed level Toilet Transfer: Total assistance Toilet Transfer Details (indicate cue type and reason): unable to safely assess  Toileting- Clothing Manipulation and Hygiene: Total assistance, Bed level Functional mobility during ADLs: Moderate assistance, +2 for physical assistance (bed mobility) General ADL Comments: Pt did not engage in simple ADL tasks   Cognition: Cognition Overall Cognitive Status: Difficult to assess Orientation Level: Intubated/Tracheostomy - Unable to assess (language barrier) Cognition Arousal/Alertness: Lethargic Behavior During Therapy: Flat affect Overall Cognitive Status: Difficult to assess Difficult to assess due to: Non-English speaking, Tracheostomy, Level of arousal  Blood pressure 165/69, pulse 93, temperature 98.6 F (37 C), temperature source Oral, resp. rate 29, height 5' (1.524 m), weight 94.8 kg (208 lb 15.9 oz), SpO2 93 %. Physical Exam  Nursing note and vitals reviewed. Constitutional: She appears well-developed and well-nourished.  Obese female with mittens bilateral hands.  Appears to be in moderate discomfort.   HENT:  Head: Normocephalic and atraumatic.  Mouth/Throat: Mucous membranes are dry. Abnormal dentition. Dental caries present.  Eyes: Conjunctivae and EOM are normal. Pupils are equal, round, and reactive to light.  Neck:  Moderate edema/erythema under trach--tender to touch.   Cardiovascular: Normal rate and regular  rhythm.   Respiratory: Effort normal. Stridor present. She has wheezes.  Productive cough with congested upper airway sounds.   GI: Soft. Bowel sounds are normal. She exhibits no distension. There is tenderness (around PEG site).  Musculoskeletal: She exhibits no edema or tenderness.  Neurological: She is alert.  Anxious and apprehensive appearing. She was distracted and hard to redirect. Indicating pain and kept closing her eyes due to discomfort. Needed max cues to follow basic motor commands. She was able to move BLE > BUE. Unable to phonate with trach occluded briefly.   Skin: Skin is warm and dry. No rash noted. No erythema.  Psychiatric: Her mood appears anxious. Cognition and memory are impaired. She is noncommunicative. She is inattentive.    Results for orders placed or performed during the hospital encounter of 02/24/16 (from the past 24 hour(s))  Glucose, capillary     Status: None   Collection Time: 03/15/16 11:16 AM  Result Value Ref Range   Glucose-Capillary 98 65 - 99 mg/dL  Glucose, capillary     Status: Abnormal   Collection Time: 03/15/16  3:38 PM  Result Value Ref Range   Glucose-Capillary 102 (H) 65 - 99 mg/dL  Glucose, capillary     Status: None   Collection Time: 03/15/16  8:17 PM  Result Value Ref Range   Glucose-Capillary 99 65 - 99 mg/dL  Glucose, capillary  Status: Abnormal   Collection Time: 03/16/16 12:01 AM  Result Value Ref Range   Glucose-Capillary 143 (H) 65 - 99 mg/dL  Glucose, capillary     Status: Abnormal   Collection Time: 03/16/16  4:14 AM  Result Value Ref Range   Glucose-Capillary 130 (H) 65 - 99 mg/dL  Magnesium     Status: None   Collection Time: 03/16/16  5:00 AM  Result Value Ref Range   Magnesium 2.2 1.7 - 2.4 mg/dL  Basic metabolic panel     Status: Abnormal   Collection Time: 03/16/16  5:00 AM  Result Value Ref Range   Sodium 135 135 - 145 mmol/L   Potassium 5.4 (H) 3.5 - 5.1 mmol/L   Chloride 90 (L) 101 - 111 mmol/L   CO2 35  (H) 22 - 32 mmol/L   Glucose, Bld 133 (H) 65 - 99 mg/dL   BUN 38 (H) 6 - 20 mg/dL   Creatinine, Ser 1.61 0.44 - 1.00 mg/dL   Calcium 8.3 (L) 8.9 - 10.3 mg/dL   GFR calc non Af Amer >60 >60 mL/min   GFR calc Af Amer >60 >60 mL/min   Anion gap 10 5 - 15  Glucose, capillary     Status: Abnormal   Collection Time: 03/16/16  8:40 AM  Result Value Ref Range   Glucose-Capillary 142 (H) 65 - 99 mg/dL   Ir Gastrostomy Tube Mod Sed  03/15/2016  INDICATION: Respiratory difficulties.  Respiratory failure.  Encephalopathy. EXAM: PERC PLACEMENT GASTROSTOMY MEDICATIONS: ; Antibiotics were administered within 1 hour of the procedure. Glucagon 1 mg IV ANESTHESIA/SEDATION: Versed One mg IV; Fentanyl 100 mcg IV Moderate Sedation Time:  15 The patient was continuously monitored during the procedure by the interventional radiology nurse under my direct supervision. CONTRAST:  10mL ISOVUE-300 IOPAMIDOL (ISOVUE-300) INJECTION 61% - administered into the gastric lumen. FLUOROSCOPY TIME:  Fluoroscopy Time: 6 minutes 48 seconds (6.1 mGy). COMPLICATIONS: None immediate. PROCEDURE: The procedure, risks, benefits, and alternatives were explained to the patient. Questions regarding the procedure were encouraged and answered. The patient understands and consents to the procedure. The epigastrium was prepped with Betadine in a sterile fashion, and a sterile drape was applied covering the operative field. A sterile gown and sterile gloves were used for the procedure. A 5-French orogastric tube is placed under fluoroscopic guidance. Scout imaging of the abdomen confirms barium within the transverse colon. The stomach was distended with gas. Under fluoroscopic guidance, an 18 gauge needle was utilized to puncture the anterior wall of the body of the stomach. An Amplatz wire was advanced through the needle passing a T fastener into the lumen of the stomach. The T fastener was secured for gastropexy. A 9-French sheath was inserted. A snare  was advanced through the 9-French sheath. A Teena Dunk was advanced through the orogastric tube. It was snared then pulled out the oral cavity, pulling the snare, as well. The leading edge of the gastrostomy was attached to the snare. It was then pulled down the esophagus and out the percutaneous site. It was secured in place. Contrast was injected. The image demonstrates placement of a 20-French pull-through type gastrostomy tube into the body of the stomach. IMPRESSION: Successful 20 French pull-through gastrostomy. Electronically Signed   By: Jolaine Click M.D.   On: 03/15/2016 14:40    Assessment/Plan: Diagnosis: debility related to sepsis/respiratory failure. Pt is morbidly obese 1. Does the need for close, 24 hr/day medical supervision in concert with the patient's rehab needs make it  unreasonable for this patient to be served in a less intensive setting? Potentially 2. Co-Morbidities requiring supervision/potential complications: HTN, trach 3. Due to bladder management, bowel management, safety, skin/wound care, disease management, medication administration, pain management and patient education, does the patient require 24 hr/day rehab nursing? Yes 4. Does the patient require coordinated care of a physician, rehab nurse, PT (1-2 hrs/day, 5 days/week), OT (1-2 hrs/day, 5 days/week) and SLP (1-2 hrs/day, 5 days/week) to address physical and functional deficits in the context of the above medical diagnosis(es)? Yes and Potentially Addressing deficits in the following areas: balance, endurance, locomotion, strength, transferring, bowel/bladder control, bathing, dressing, feeding, grooming, toileting, swallowing and psychosocial support 5. Can the patient actively participate in an intensive therapy program of at least 3 hrs of therapy per day at least 5 days per week? No and Potentially 6. The potential for patient to make measurable gains while on inpatient rehab is fair 7. Anticipated functional  outcomes upon discharge from inpatient rehab are mod assist and max assist  with PT, mod assist and max assist with OT, modified independent and supervision with SLP. 8. Estimated rehab length of stay to reach the above functional goals is: ?to be determined 9. Does the patient have adequate social supports and living environment to accommodate these discharge functional goals? Potentially 10. Anticipated D/C setting: Other 11. Anticipated post D/C treatments: TBD 12. Overall Rehab/Functional Prognosis: fair  RECOMMENDATIONS: This patient's condition is appropriate for continued rehabilitative care in the following setting: SNF Patient has agreed to participate in recommended program. N/A Note that insurance prior authorization may be required for reimbursement for recommended care.  Comment: Pt requiring total assist currently. She would struggle to tolerate the intensity of our program. Will follow along for any changes.   Ranelle OysterZachary T. Monifa Blanchette, MD, [place] [facility] Hospital[facility]APMR [facility] Physical Medicine & Rehabilitation 03/16/2016     03/16/2016

## 2016-03-16 NOTE — Progress Notes (Signed)
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 The trach was suctioned, secured with velcro ties, and noted to be in good position. The patient tolerated the procedure well with no immediate complications.  Plan: can continue to use 6 cuffless proximal XLT shiley until follow up in the office. She can use the passy-muir valve with appropriate teaching and supervision. Her thyroidectomy incision looks good and her calciums have been stable s/p total thyroidectomy. Can follow up in the office for laryngoscopy after she is eventually discharged.  Melvenia BeamGore, Jeralyn Nolden, MD  03/16/2016  10:20 AM

## 2016-03-17 ENCOUNTER — Inpatient Hospital Stay (HOSPITAL_COMMUNITY): Payer: Medicaid Other

## 2016-03-17 DIAGNOSIS — E8729 Other acidosis: Secondary | ICD-10-CM | POA: Diagnosis present

## 2016-03-17 DIAGNOSIS — J9811 Atelectasis: Secondary | ICD-10-CM

## 2016-03-17 DIAGNOSIS — R1310 Dysphagia, unspecified: Secondary | ICD-10-CM | POA: Diagnosis present

## 2016-03-17 DIAGNOSIS — J690 Pneumonitis due to inhalation of food and vomit: Secondary | ICD-10-CM | POA: Diagnosis present

## 2016-03-17 DIAGNOSIS — Z930 Tracheostomy status: Secondary | ICD-10-CM

## 2016-03-17 DIAGNOSIS — N179 Acute kidney failure, unspecified: Secondary | ICD-10-CM | POA: Diagnosis present

## 2016-03-17 DIAGNOSIS — E872 Acidosis: Secondary | ICD-10-CM

## 2016-03-17 LAB — POCT I-STAT 3, ART BLOOD GAS (G3+)
Acid-Base Excess: 13 mmol/L — ABNORMAL HIGH (ref 0.0–2.0)
BICARBONATE: 40.6 meq/L — AB (ref 20.0–24.0)
O2 Saturation: 98 %
PCO2 ART: 67.8 mmHg — AB (ref 35.0–45.0)
PO2 ART: 121 mmHg — AB (ref 80.0–100.0)
Patient temperature: 100
TCO2: 43 mmol/L (ref 0–100)
pH, Arterial: 7.388 (ref 7.350–7.450)

## 2016-03-17 LAB — CBC WITH DIFFERENTIAL/PLATELET
Basophils Absolute: 0 10*3/uL (ref 0.0–0.1)
Basophils Relative: 0 %
EOS ABS: 0.1 10*3/uL (ref 0.0–0.7)
Eosinophils Relative: 1 %
HEMATOCRIT: 36.6 % (ref 36.0–46.0)
Hemoglobin: 10.6 g/dL — ABNORMAL LOW (ref 12.0–15.0)
LYMPHS ABS: 1.3 10*3/uL (ref 0.7–4.0)
Lymphocytes Relative: 12 %
MCH: 25.7 pg — AB (ref 26.0–34.0)
MCHC: 29 g/dL — AB (ref 30.0–36.0)
MCV: 88.6 fL (ref 78.0–100.0)
MONOS PCT: 7 %
Monocytes Absolute: 0.7 10*3/uL (ref 0.1–1.0)
NEUTROS ABS: 8.2 10*3/uL — AB (ref 1.7–7.7)
NEUTROS PCT: 79 %
Platelets: 334 10*3/uL (ref 150–400)
RBC: 4.13 MIL/uL (ref 3.87–5.11)
RDW: 19 % — ABNORMAL HIGH (ref 11.5–15.5)
WBC: 10.3 10*3/uL (ref 4.0–10.5)

## 2016-03-17 LAB — COMPREHENSIVE METABOLIC PANEL
ALK PHOS: 67 U/L (ref 38–126)
ALT: 11 U/L — AB (ref 14–54)
AST: 20 U/L (ref 15–41)
Albumin: 2.3 g/dL — ABNORMAL LOW (ref 3.5–5.0)
Anion gap: 12 (ref 5–15)
BUN: 43 mg/dL — ABNORMAL HIGH (ref 6–20)
CHLORIDE: 92 mmol/L — AB (ref 101–111)
CO2: 37 mmol/L — AB (ref 22–32)
Calcium: 8.7 mg/dL — ABNORMAL LOW (ref 8.9–10.3)
Creatinine, Ser: 0.9 mg/dL (ref 0.44–1.00)
Glucose, Bld: 155 mg/dL — ABNORMAL HIGH (ref 65–99)
Potassium: 3.5 mmol/L (ref 3.5–5.1)
Sodium: 141 mmol/L (ref 135–145)
Total Bilirubin: 0.5 mg/dL (ref 0.3–1.2)
Total Protein: 7.5 g/dL (ref 6.5–8.1)

## 2016-03-17 LAB — BLOOD GAS, ARTERIAL
Acid-Base Excess: 13.6 mmol/L — ABNORMAL HIGH (ref 0.0–2.0)
BICARBONATE: 40.7 meq/L — AB (ref 20.0–24.0)
DRAWN BY: 312761
FIO2: 0.98
O2 SAT: 95.6 %
PH ART: 7.274 — AB (ref 7.350–7.450)
Patient temperature: 98.9
TCO2: 43.5 mmol/L (ref 0–100)
pCO2 arterial: 91.1 mmHg (ref 35.0–45.0)
pO2, Arterial: 96.9 mmHg (ref 80.0–100.0)

## 2016-03-17 LAB — GLUCOSE, CAPILLARY
GLUCOSE-CAPILLARY: 140 mg/dL — AB (ref 65–99)
Glucose-Capillary: 143 mg/dL — ABNORMAL HIGH (ref 65–99)
Glucose-Capillary: 159 mg/dL — ABNORMAL HIGH (ref 65–99)
Glucose-Capillary: 155 mg/dL — ABNORMAL HIGH (ref 65–99)

## 2016-03-17 LAB — MAGNESIUM: MAGNESIUM: 2 mg/dL (ref 1.7–2.4)

## 2016-03-17 MED ORDER — DEXTROSE-NACL 5-0.45 % IV SOLN
INTRAVENOUS | Status: DC
  Administered 2016-03-17: 12:00:00 via INTRAVENOUS

## 2016-03-17 MED ORDER — DEXTROSE-NACL 5-0.9 % IV SOLN
INTRAVENOUS | Status: DC
Start: 2016-03-17 — End: 2016-03-17

## 2016-03-17 MED ORDER — CHLORHEXIDINE GLUCONATE 0.12% ORAL RINSE (MEDLINE KIT): 15.0000 mL | Freq: Two times a day (BID) | OROMUCOSAL | Status: DC

## 2016-03-17 MED ORDER — PANTOPRAZOLE SODIUM 40 MG IV SOLR
40.0000 mg | INTRAVENOUS | Status: DC
  Administered 2016-03-17 – 2016-03-20 (×4): 40 mg via INTRAVENOUS
  Filled 2016-03-17 (×7): qty 40

## 2016-03-17 MED ORDER — FENTANYL CITRATE (PF) 100 MCG/2ML IJ SOLN
50.0000 ug | INTRAMUSCULAR | Status: AC | PRN
  Administered 2016-03-19 – 2016-03-22 (×3): 50 ug via INTRAVENOUS
  Filled 2016-03-17 (×4): qty 2

## 2016-03-17 MED ORDER — SODIUM CHLORIDE 0.9 % IV SOLN
1.5000 g | Freq: Four times a day (QID) | INTRAVENOUS | Status: DC
  Administered 2016-03-17 – 2016-03-24 (×29): 1.5 g via INTRAVENOUS
  Filled 2016-03-17 (×32): qty 1.5

## 2016-03-17 MED ORDER — SODIUM CHLORIDE 0.9 % IV SOLN
INTRAVENOUS | Status: DC
  Administered 2016-03-17 – 2016-04-02 (×9): via INTRAVENOUS

## 2016-03-17 MED ORDER — DOCUSATE SODIUM 50 MG/5ML PO LIQD: 100.0000 mg | Freq: Two times a day (BID) | ORAL | Status: DC | PRN

## 2016-03-17 MED ORDER — ANTISEPTIC ORAL RINSE SOLUTION (CORINZ)
7.0000 mL | Freq: Four times a day (QID) | OROMUCOSAL | Status: DC
  Administered 2016-03-17 – 2016-03-22 (×19): 7 mL via OROMUCOSAL

## 2016-03-17 MED ORDER — FENTANYL CITRATE (PF) 100 MCG/2ML IJ SOLN
50.0000 ug | INTRAMUSCULAR | Status: DC | PRN
  Administered 2016-03-18 (×2): 50 ug via INTRAVENOUS
  Filled 2016-03-17: qty 2

## 2016-03-17 MED ORDER — MIDAZOLAM HCL 2 MG/2ML IJ SOLN
1.0000 mg | INTRAMUSCULAR | Status: DC | PRN
Start: 2016-03-17 — End: 2016-03-22
  Administered 2016-03-18 – 2016-03-21 (×9): 1 mg via INTRAVENOUS
  Filled 2016-03-17 (×11): qty 2

## 2016-03-17 MED ORDER — ALTEPLASE 2 MG IJ SOLR
2.0000 mg | Freq: Once | INTRAMUSCULAR | Status: AC
  Administered 2016-03-17: 2 mg
  Filled 2016-03-17: qty 2

## 2016-03-17 MED ORDER — MIDAZOLAM HCL 2 MG/2ML IJ SOLN
1.0000 mg | INTRAMUSCULAR | Status: AC | PRN
  Administered 2016-03-17 – 2016-03-19 (×3): 1 mg via INTRAVENOUS
  Filled 2016-03-17 (×2): qty 2

## 2016-03-17 MED ORDER — LEVOTHYROXINE SODIUM 100 MCG IV SOLR
50.0000 ug | Freq: Every day | INTRAVENOUS | Status: DC
  Administered 2016-03-18 – 2016-04-04 (×18): 50 ug via INTRAVENOUS
  Filled 2016-03-17 (×20): qty 5

## 2016-03-17 NOTE — Progress Notes (Signed)
CBG 159, Patient was ordered to have dextrose 5% 0.45 normal saline. [facility] to switch to normal saline at 6850ml/hr.

## 2016-03-17 NOTE — Progress Notes (Signed)
PULMONARY / CRITICAL CARE MEDICINE    Name: Kendra Osborne MRN: [id number] DOB: 05-05-1946    ADMISSION DATE:  02/24/2016  PRIMARY SERVICE: PCCM  CHIEF COMPLAINT:  Dyspnea  BRIEF PATIENT DESCRIPTION: 70 y/o woman recently moved from [place] who presents with respiratory distress. She presented to the ED on 3/17 with complaints of dyspnea, and lower extremity edema. History is limited due to language / culture barriers, but she has a PMHx of a known goiter that has been present for years, as well as possibly hypertension. It is not clear if the goiter was ever worked up or if she ever was on regular therapy for her hypertension. Her Nephew and son (who speaks little [place]) report that she been having a cough for a few days as well as some fevers and chills.  SUBJECTIVE:   RT and nurse in room concerned at least 2 aspiration events since trach changed 4/7 from 6 cuffed to 6 uncuffed XLT, Patient not communicative.   VITAL SIGNS: Temp:  [98.2 F (36.8 C)-100 F (37.8 C)] 98.6 F (37 C) (04/08 0800) Pulse Rate:  [72-93] 77 (04/08 0800) Resp:  [17-42] 30 (04/08 0800) BP: (110-171)/(41-99) 160/70 mmHg (04/08 0800) SpO2:  [86 %-100 %] 97 % (04/08 0800) FiO2 (%):  [28 %-100 %] 98 % (04/08 0353) Weight:  [95.6 kg (210 lb 12.2 oz)] 95.6 kg (210 lb 12.2 oz) (04/08 0357) HEMODYNAMICS:   VENTILATOR SETTINGS: Vent Mode:  [-]  FiO2 (%):  [28 %-100 %] 98 %  INTAKE / OUTPUT: Intake/Output      04/07 0701 - 04/08 0700 04/08 0701 - 04/09 0700   I.V. (mL/kg) 70 (0.7)    NG/GT 959.6    IV Piggyback     Total Intake(mL/kg) 1029.6 (10.8)    Urine (mL/kg/hr) 1325 (0.6)    Stool 0 (0)    Total Output 1325     Net -295.4          Stool Occurrence 3 x     PHYSICAL EXAMINATION: General: No acute distress.  Trach in place. Increased work of breathing  Integument:  Warm & dry. No rash on exposed skin. Surgical incision on neck clean & dry. HEENT:  Trach in place Cardiovascular:  s1 s2  RRR Pulmonary:  Coarse sounds, diminished Abdomen: Soft. Normal bowel sounds. Nondistended. PEG Neurological: Spontaneously moving all 4 extremities. Eyes open. Appears grossly nonfocal. Not very interactive  LABS:  CBC  Recent Labs Lab 03/14/16 0415 03/15/16 0421 03/17/16 0521  WBC 8.0 8.5 10.3  HGB 9.6* 9.7* 10.6*  HCT 31.7* 32.4* 36.6  PLT 310 311 334   Coag's  Recent Labs Lab 03/15/16 0421  INR 1.06   BMET  Recent Labs Lab 03/15/16 0421 03/16/16 0500 03/16/16 1030 03/17/16 0521  NA 136 135  --  141  K 3.5 5.4* 3.9 3.5  CL 90* 90*  --  92*  CO2 36* 35*  --  37*  BUN 36* 38*  --  43*  CREATININE 0.72 0.75  --  0.90  GLUCOSE 136* 133*  --  155*   Electrolytes  Recent Labs Lab 03/13/16 0411  03/14/16 0415 03/15/16 0421 03/16/16 0500 03/17/16 0521  CALCIUM 7.8*  --  8.1* 8.3* 8.3* 8.7*  MG 1.7  < > 2.2 2.0 2.2 2.0  PHOS 2.4*  --  4.6 3.4  --   --   < > = values in this interval not displayed. Sepsis Markers No results for input(s): LATICACIDVEN, PROCALCITON, O2SATVEN  in the last 168 hours. ABG  Recent Labs Lab 03/17/16 0355  PHART 7.274*  PCO2ART 91.1*  PO2ART 96.9   Liver Enzymes  Recent Labs Lab 03/12/16 0555 03/17/16 0521  AST  --  20  ALT  --  11*  ALKPHOS  --  67  BILITOT  --  0.5  ALBUMIN 2.2* 2.3*   Cardiac Enzymes No results for input(s): TROPONINI, PROBNP in the last 168 hours. Glucose  Recent Labs Lab 03/16/16 0414 03/16/16 0840 03/16/16 1237 03/16/16 1543 03/16/16 2326 03/17/16 0804  GLUCAP 130* 142* 115* 145* 128* 143*   Imaging Ir Gastrostomy Tube Mod Sed  03/15/2016  INDICATION: Respiratory difficulties.  Respiratory failure.  Encephalopathy. EXAM: PERC PLACEMENT GASTROSTOMY MEDICATIONS: ; Antibiotics were administered within 1 hour of the procedure. Glucagon 1 mg IV ANESTHESIA/SEDATION: Versed One mg IV; Fentanyl 100 mcg IV Moderate Sedation Time:  15 The patient was continuously monitored during the procedure  by the interventional radiology nurse under my direct supervision. CONTRAST:  10mL ISOVUE-300 IOPAMIDOL (ISOVUE-300) INJECTION 61% - administered into the gastric lumen. FLUOROSCOPY TIME:  Fluoroscopy Time: 6 minutes 48 seconds (6.1 mGy). COMPLICATIONS: None immediate. PROCEDURE: The procedure, risks, benefits, and alternatives were explained to the patient. Questions regarding the procedure were encouraged and answered. The patient understands and consents to the procedure. The epigastrium was prepped with Betadine in a sterile fashion, and a sterile drape was applied covering the operative field. A sterile gown and sterile gloves were used for the procedure. A 5-French orogastric tube is placed under fluoroscopic guidance. Scout imaging of the abdomen confirms barium within the transverse colon. The stomach was distended with gas. Under fluoroscopic guidance, an 18 gauge needle was utilized to puncture the anterior wall of the body of the stomach. An Amplatz wire was advanced through the needle passing a T fastener into the lumen of the stomach. The T fastener was secured for gastropexy. A 9-French sheath was inserted. A snare was advanced through the 9-French sheath. A Teena DunkBenson was advanced through the orogastric tube. It was snared then pulled out the oral cavity, pulling the snare, as well. The leading edge of the gastrostomy was attached to the snare. It was then pulled down the esophagus and out the percutaneous site. It was secured in place. Contrast was injected. The image demonstrates placement of a 20-French pull-through type gastrostomy tube into the body of the stomach. IMPRESSION: Successful 20 French pull-through gastrostomy. Electronically Signed   By: Jolaine ClickArthur  Hoss M.D.   On: 03/15/2016 14:40   Dg Chest Port 1 View  03/17/2016  CLINICAL DATA:  Poor breath sounds, poor air movement, evaluate interval change. EXAM: PORTABLE CHEST 1 VIEW COMPARISON:  03/09/2016 FINDINGS: Tracheostomy tube at the  thoracic inlet. There is a right central line, tip in the region of the distal SVC. Progressive elevation of right hemidiaphragm with increased volume loss in the right lung and increased right basilar atelectasis. Left lung is hypo aerated with probable atelectasis at the left lung base. Cardiomediastinal contours are unchanged, but partially obscured. IMPRESSION: Increasing right lung base atelectasis with volume loss and elevation of the right hemidiaphragm. Increased left basilar atelectasis. Aspiration can cause this appearance. Electronically Signed   By: Rubye OaksMelanie  Ehinger M.D.   On: 03/17/2016 02:45   SIGNIFICANT EVENTS: 3/17 - Admit 3/21 - Thyroidectomy 3/24 - Extubation & reintubation within 2 hours due to hypoxia & secretions 4/6- 24 hr trach collar 4/7- trach change by ENT to 6 uncuffed proximal XLT  STUDIES:  CT-A of Chest >> mild mediastinal enlargement CT of the Neck with contrast >> multinodular goiter, meningioma Port CXR 3/22:  ETT & CVL in good position. Blunting bilateral costophrenic angles suggestive of effusions. Bilateral hilar fullness & interstitial prominence with low lung volumes.  Port CXR 3/25:. Silhouetting of the left hemidiaphragm. Platelike atelectasis right lower lung. Endotracheal tube in good position. Ort CXR 4/8: incr bibasilar atelectasis  LINES / TUBES: OETT 7.5 3/17 - 3/24; 7.0 3/24>>>3/31 Trach (ENT) 3/31>>> L Subclavian CVL 3/21>>> L Port 3/21>>> OGT 3/21 - 3/24; 3/24>>> Foley 3/18>>> PIV x1 L Radial Art Line 3/21 - 3/23 R & L Neck Drains 3/21 - ???  MICROBIOLOGY: Tracheal Asp Ctx 3/25>>> ng Blood Ctx x2 3/18:  Negative  MRSA PCR 3/18:  Negative   ANTIBIOTICS: Unasyn 3/24>>> Ancef 3/21 - 3/24 Clindamycin 3/21 (only 1 dose) Azithromycin 3/18 - 3/19 Rocephin 3/18 - 3/20  ASSESSMENT / PLAN:  PULMONARY A: Acute on Chronic Hypercarbic Respiratory Failure Acute Hypoxic Respiratory Failure - Question element of pulmonary edema vs viral  bronchitis/pneumonitis. Extrinsic Airway Compression - Secondary to goiter. S/P total thyroidectomy 3/21. Recurrent aspiration since change to uncuffed trach 4/7 ABG 3:55 AM 4/8- 7.27/ pCO2 91/ pO2 96.9/ HCO3 40.7 P:   I will contact Critical Care about potential transfer to ICU, trach change to cuffed, possible vent for hypercapneic respiratory failure  CARDIOVASCULAR A: HTN - TTE 3/19 nml LV fn, gr 1 DD.  P:   Hydralazine IV PRN, not needed Lasix tele  RENAL A: Hypokalemia - Replacing. Hypernatremia - Mild but stable. Hypomagnesemia - Resolved. Hyponatremia - Resolved. Acute Renal Failure - Resolved.  P:   kvo Dc free water. k supp to IV lasix  GASTROINTESTINAL A: dyspahgia  P:   Protonix daily. TF held For peg  HEMATOLOGIC A: Anemia - Mild.  P:   Limit phlebotomy SCDs. Continue Heparin [place] q8hr.  INFECTIOUS A: CAP vs Aspiration Pneumonia-treated P:   Monitor for fever & leukocytosis. May need more aggressive airway clearance evaluation  ENDOCRINE A: S/P Total Thyroidectomy - Previously normal TSH.  P:   Monitor calcium level w/ daily labs. Synthroid VT daily  tsh in 6 weeks  NEUROLOGIC A: Post-Op Pain  P:   RASS Goal: 0  Versed IV prn Klonipin to 2 mg PO BID, consider slow reduction  Seroquel to 25 mg TID, could escalate if n eeded Continue fentanyl patch 75 mcg and off fentanyl drip.     CD Maple Hudson, MD [facility] Pulmonary & Critical Care p- [phone]

## 2016-03-17 NOTE — Progress Notes (Signed)
[place] TEAM 1 - Stepdown/ICU TEAM Progress Note  Kendra Osborne ZOX:[id number] DOB: 09/12/1946 DOA: 02/24/2016 PCP: No PCP Per Patient  Admit HPI / Brief Narrative: 70 y/o [name] who presents with respiratory distress. She presented to the ED on 3/17 with complaints of dyspnea, and lower extremity edema. History is limited due to language / culture barriers, but she has a PMHx of a known goiter that has been present for years, as well as possibly hypertension. It is not clear if the goiter was ever worked up or if she ever was on regular therapy for her hypertension. Her Nephew and son (who speaks little [place]) report that she been having a cough for a few days as well as some fevers and chills.   HPI/Subjective: 4/8 paged to bedside secondary to increased WOB/RR. Upon arrival patient's eyes are open, not following directions. Tachycardic, tachypnea. Review of chart and speaking to [employer] Barbara overnight patient aspirated. Was seen by Simmie Daviesr.Aaron Trimble PCCM. Patient at ~0 342 was found to have low lung volumes, RR in the 40s. Per note plan was to change back to a cuffed trach, and the patient's condition deteriorated transfer to ICU.    Assessment/Plan: Acute on Chronic respiratory failure with hypercarbia/CAP vs Aspiration pneumonia -Completed course of antibiotics? -Multifactorial to include element of pulmonary edema vs viral bronchitis/pneumonitis,extrinsic airway compression, and iatrogenic (oversedation). -Discontinued Clonazepam 1 mg BID -DC Versed -Discontinue Seroquel 12.5 mg  TID -Fentanyl patch was started overnight continue fentanyl patch 75 g -Minimize all sedating medication -Have spoken to NP Katie Will for ICU #6 cuffed trach and placed back on mechanical ventilation -Given patient's poor recovery status today will empirically restart Unasyn -Will hold on starting steroids as studies are inconclusive to benefit  Respiratory acidosis -Secondary  to overnight aspiration -PCXR appears C/W RLL aspiration    Extrinsic Airway Compression Secondary to goiter -Resolved S/P total thyroidectomy 3/21.  HTN -Patient relatively hypotensive hold all scheduled BP medication -Hydralazine IV PRN,   Chronic diastolic CHF -Strict in and out since admission - 10.9 L -Daily weight Filed Weights   03/15/16 1440 03/16/16 0500 03/17/16 0357  Weight: 95 kg (209 lb 7 oz) 94.8 kg (208 lb 15.9 oz) 95.6 kg (210 lb 12.2 oz)     Hypokalemia  -Resolved continue to monitor closely  Hypernatremia -Resolved   Hypomagnesemia  -Resolved  Hyponatremia  - Resolved.  Acute Renal Failure unspecified renal type - Resolved.  Dyspahgia -Nothing by mouth until patient's cognition significantly improves and can pass swallow study -Hold all feedings secondary to aspiration  -Start D5-0.9% saline at 5075ml/hr  Postoperative Hypothyroidism -S/P Total Thyroidectomy - Previously normal TSH. -Change Synthroid 100mcg daily--> 50 g IV - Recheck TSH in 6 weeks    Code Status: FULL Family Communication: no family present at time of exam Disposition Plan: Transfer to ICU secondary aspiration    Consultants: Dr.Mitchell Gore ENT Dr.Arthur Hoss IR Dr.Daniel Daneil DanJ Feinstein PCCM   Procedure/Significant Events: 3/18 CTA of Chest >> mild mediastinal enlargement 3/18 CT soft tissue Neck with contrast >> multinodular goiter, meningioma 3/19 echocardiogram;- Left ventricle: moderate LVH. -LVEF=60%-65%. -(grade 1 diastolic dysfunction). - Left atrium:  moderately dilated. - Systemic veins: IVC measured 2.3 cm with < 50% respirophasic  variation, suggesting RA pressure 15 mmHg. 3/21 - Thyroidectomy PCXR 3/22: ETT & CVL in good position. Blunting bilateral costophrenic angles suggestive of effusions. Bilateral hilar fullness & interstitial prominence with low lung volumes.  3/24 - Extubation & reintubation  within 2 hours due to hypoxia & secretions PCXR  3/25:. Silhouetting of the left hemidiaphragm. Platelike atelectasis right lower lung. Endotracheal tube in good position 4/6- 24 hr trach collar 4/6.IR GASTROSTOMY TUBE placed 4/7tracheostomy tube change ;old 6 cuffed proximal XLT shiley was removed with the old silk sutures, and a new 6 cuffless proximal XLT shiley was placed    Culture Tracheal Asp Ctx 3/25>>> ng Blood Ctx x2 3/18: Negative  MRSA PCR 3/18: Negative    Antibiotics: Unasyn 3/24>>>4/3; 4/8>> Ancef 3/21 - 3/24 Clindamycin 3/21 (only 1 dose) Azithromycin 3/18 - 3/19 Rocephin 3/18 - 3/20   DVT prophylaxis: SCD   Devices    LINES / TUBES:  OETT 7.5 3/17 - 3/24; 7.0 3/24>>>3/31 Trach (ENT) 3/31>>> L Subclavian CVL 3/21>>> L Port 3/21>>> OGT 3/21 - 3/24; 3/24>>> Foley 3/18>>> PIV x1 L Radial Art Line 3/21 - 3/23 R & L Neck Drains 3/21 - ???     Continuous Infusions: . dextrose 5 % and 0.9% NaCl      Objective: VITAL SIGNS: Temp: 98.6 F (37 C) (04/08 0800) Temp Source: Axillary (04/08 0800) BP: 160/70 mmHg (04/08 0800) Pulse Rate: 77 (04/08 0800) SPO2; FIO2:   Intake/Output Summary (Last 24 hours) at 03/17/16 1101 Last data filed at 03/17/16 1052  Gross per 24 hour  Intake 759.59 ml  Output   1625 ml  Net -865.41 ml     Exam: General: Patient's eyes are open, does not follow commands, positive acute respiratory distress  Eyes: Negative headache, negative scleral hemorrhage ENT: Negative Runny nose, negative gingival bleeding,  Neck:  Negative scars, masses, torticollis, lymphadenopathy, JVD, trach collar in place negative sign of infection Lungs: tachypnea distant breath sounds, poor air movement, negative breath sounds RLL, negative wheezes or crackles Cardiovascular: Tachycardic, Regular rhythm without murmur gallop or rub normal S1 and S2 Abdomen: Morbidly obese, negative abdominal pain, nondistended, positive soft, bowel sounds, no rebound, no ascites, no appreciable mass, PEG  tube in place Extremities: No significant cyanosis, clubbing, or edema bilateral lower extremities Psychiatric:  Unable to evaluate secondary to sedation  Neurologic:  Unable to evaluate secondary to sedation   Data Reviewed: Basic Metabolic Panel:  Recent Labs Lab 03/11/16 0640 03/12/16 0555 03/13/16 0411 03/13/16 1914 03/14/16 0415 03/15/16 0421 03/16/16 0500 03/16/16 1030 03/17/16 0521  NA 140 139 135  --  138 136 135  --  141  K 3.8 3.3* 3.7 4.1 3.8 3.5 5.4* 3.9 3.5  CL 98* 96* 95*  --  92* 90* 90*  --  92*  CO2 31 32 28  --  35* 36* 35*  --  37*  GLUCOSE 130* 123* 142*  --  135* 136* 133*  --  155*  BUN 22* 33* 36*  --  33* 36* 38*  --  43*  CREATININE 1.03* 1.05* 0.89  --  0.78 0.72 0.75  --  0.90  CALCIUM 8.4* 7.9* 7.8*  --  8.1* 8.3* 8.3*  --  8.7*  MG 1.9 1.7 1.7 2.1 2.2 2.0 2.2  --  2.0  PHOS 3.7 3.6 2.4*  --  4.6 3.4  --   --   --    Liver Function Tests:  Recent Labs Lab 03/12/16 0555 03/17/16 0521  AST  --  20  ALT  --  11*  ALKPHOS  --  67  BILITOT  --  0.5  PROT  --  7.5  ALBUMIN 2.2* 2.3*   No results for input(s): LIPASE, AMYLASE  in the last 168 hours. No results for input(s): AMMONIA in the last 168 hours. CBC:  Recent Labs Lab 03/11/16 0640 03/12/16 0555 03/13/16 0411 03/14/16 0415 03/15/16 0421 03/17/16 0521  WBC 11.2* 10.5 9.2 8.0 8.5 10.3  NEUTROABS 7.0 7.1  --   --   --  8.2*  HGB 10.6* 9.6* 9.2* 9.6* 9.7* 10.6*  HCT 35.2* 32.5* 31.4* 31.7* 32.4* 36.6  MCV 84.6 85.3 85.8 87.3 85.3 88.6  PLT 383 362 331 310 311 334   Cardiac Enzymes: No results for input(s): CKTOTAL, CKMB, CKMBINDEX, TROPONINI in the last 168 hours. BNP (last 3 results)  Recent Labs  02/24/16 2000  BNP 983.4*    ProBNP (last 3 results) No results for input(s): PROBNP in the last 8760 hours.  CBG:  Recent Labs Lab 03/16/16 0840 03/16/16 1237 03/16/16 1543 03/16/16 2326 03/17/16 0804  GLUCAP 142* 115* 145* 128* 143*    No results found for  this or any previous visit (from the past 240 hour(s)).   Studies:  Recent x-ray studies have been reviewed in detail by the Attending Physician  Scheduled Meds:  Scheduled Meds: . antiseptic oral rinse  7 mL Mouth Rinse q12n4p  . bacitracin   Topical TID  . chlorhexidine  15 mL Mouth Rinse BID  . fentaNYL  75 mcg Transdermal Q72H  . furosemide  40 mg Intravenous Q12H  . heparin subcutaneous  5,000 Units Subcutaneous 3 times per day  . [START ON 03/18/2016] levothyroxine  50 mcg Intravenous QAC breakfast  . pantoprazole (PROTONIX) IV  40 mg Intravenous Q24H  . sodium chloride flush  10-40 mL Intracatheter Q12H    Time spent on care of this patient: 40 mins   WOODS, Roselind Messier , MD  [facility] Office  [phone] Pager - [phone]  On-Call/Text Page:      [email or web address]      password [email or web address]  If 7PM-7AM, please contact night-coverage [email or web address] Password [email or web address] 03/17/2016, 11:01 AM   LOS: 21 days   Care during the described time interval was provided by me .  I have reviewed this patient's available data, including medical history, events of note, physical examination, and all test results as part of my evaluation. I have personally reviewed and interpreted all radiology studies.   Carolyne Littles, MD [phone] Pager

## 2016-03-17 NOTE — Progress Notes (Signed)
RT note-Patient transfer to 2MW-13, with possible aspiration.

## 2016-03-17 NOTE — Progress Notes (Signed)
Pt noted to be requiring increased oxygen demand. RN at bedside suctioning patient and tube feed noted in return of suction catheter. RN increased FiO2 to 98%, 10L. RT called and M.Burnadette PeterLynch, NP called. RN will continue to monitor.

## 2016-03-17 NOTE — Progress Notes (Signed)
Awaiting stat CXR

## 2016-03-17 NOTE — Progress Notes (Addendum)
ANTIBIOTIC CONSULT NOTE - INITIAL  Pharmacy Consult for Unasyn Indication: aspiration pneumonia  Not on File  Patient Measurements: Height: 5' (152.4 cm) Weight: 210 lb 12.2 oz (95.6 kg) IBW/kg (Calculated) : 45.5   Vital Signs: Temp: 98.6 F (37 C) (04/08 0800) Temp Source: Axillary (04/08 0800) BP: 160/70 mmHg (04/08 0800) Pulse Rate: 77 (04/08 0800) Intake/Output from previous day: 04/07 0701 - 04/08 0700 In: 1029.6 [I.V.:70; NG/GT:959.6] Out: 1325 [Urine:1325] Intake/Output from this shift:    Labs:  Recent Labs  03/15/16 0421 03/16/16 0500 03/17/16 0521  WBC 8.5  --  10.3  HGB 9.7*  --  10.6*  PLT 311  --  334  CREATININE 0.72 0.75 0.90   Estimated Creatinine Clearance: 61.9 mL/min (by C-G formula based on Cr of 0.9). No results for input(s): VANCOTROUGH, VANCOPEAK, VANCORANDOM, GENTTROUGH, GENTPEAK, GENTRANDOM, TOBRATROUGH, TOBRAPEAK, TOBRARND, AMIKACINPEAK, AMIKACINTROU, AMIKACIN in the last 72 hours.   Microbiology: Recent Results (from the past 720 hour(s))  MRSA PCR Screening     Status: None   Collection Time: 02/25/16  2:37 AM  Result Value Ref Range Status   MRSA by PCR NEGATIVE NEGATIVE Final    Comment:        The GeneXpert MRSA Assay (FDA approved for NASAL specimens only), is one component of a comprehensive MRSA colonization surveillance program. It is not intended to diagnose MRSA infection nor to guide or monitor treatment for MRSA infections.   Culture, blood (routine x 2)     Status: None   Collection Time: 02/25/16  3:07 AM  Result Value Ref Range Status   Specimen Description BLOOD LEFT FOREARM  Final   Special Requests IN PEDIATRIC BOTTLE 3ML  Final   Culture NO GROWTH 5 DAYS  Final   Report Status 03/01/2016 FINAL  Final  Culture, blood (routine x 2)     Status: None   Collection Time: 02/25/16  3:23 AM  Result Value Ref Range Status   Specimen Description BLOOD LEFT HAND  Final   Special Requests IN PEDIATRIC BOTTLE 1ML   Final   Culture NO GROWTH 5 DAYS  Final   Report Status 03/01/2016 FINAL  Final  Culture, respiratory (NON-Expectorated)     Status: None   Collection Time: 03/03/16  3:30 AM  Result Value Ref Range Status   Specimen Description TRACHEAL ASPIRATE  Final   Special Requests NONE  Final   Gram Stain   Final    NO WBC SEEN NO SQUAMOUS EPITHELIAL CELLS SEEN NO ORGANISMS SEEN Performed at [facility]    Culture   Final    NO GROWTH 2 DAYS Performed at [facility]    Report Status 03/06/2016 FINAL  Final    Medical History: Past Medical History  Diagnosis Date  . Hypertension   . Chronic diastolic CHF (congestive heart failure) (HCC)     Medications:  Scheduled:  . antiseptic oral rinse  7 mL Mouth Rinse q12n4p  . bacitracin   Topical TID  . chlorhexidine  15 mL Mouth Rinse BID  . feeding supplement (PRO-STAT SUGAR FREE 64)  30 mL Per Tube Daily  . fentaNYL  75 mcg Transdermal Q72H  . furosemide  40 mg Intravenous Q12H  . heparin subcutaneous  5,000 Units Subcutaneous 3 times per day  . levothyroxine  100 mcg Oral QAC breakfast  . pantoprazole sodium  40 mg Per Tube Daily  . sodium chloride flush  10-40 mL Intracatheter Q12H   Infusions:  .  feeding supplement (VITAL AF 1.2 CAL) Stopped (03/17/16 0100)   PRN: sodium chloride, fentaNYL (SUBLIMAZE) injection, hydrALAZINE, iohexol, ondansetron, pneumococcal 23 valent vaccine, sodium chloride flush Assessment: 29 YOF who recently moved from [place] who presented w/ respiratory distress on 3/17. Pharmacy consulted to dose Unasyn for aspiration PNA.   WBC 10.3, tmax/24 100    Unasyn 3/24>>4/3  4/8 Ancef 3/21 - 3/24 Clindamycin 3/21 (only 1 dose) Azithromycin 3/18 - 3/19 Rocephin 3/18 - 3/20  Tracheal Asp Ctx 3/25>>> ng Blood Ctx x2 3/18: Negative  MRSA PCR 3/18: Negative   Plan:  Unasyn 1.5 g Q6H  Monitor renal function, s/sx worsening infection  Follow up culture results  Hazle Nordmann,  PharmD Pharmacy Resident [phone]

## 2016-03-17 NOTE — Progress Notes (Signed)
Dr.Deterding called regarding arterial blood gas, no new orders received.

## 2016-03-17 NOTE — Progress Notes (Signed)
Patient to transfer to 2M13 report given to receiving nurse, all questions answered at this time.  Patient transported with RN and RT.

## 2016-03-17 NOTE — Progress Notes (Signed)
Patient experiencing increased RR in the 30's with a HR in the low 100's.  Pt. Is on a trach collar with 02 at 100% with saturation from 88 to low 90's.  RT and Pulmonology at bedside.  Dr. Joseph ArtWoods paged and updated on patients status.  Will continue to monitor.

## 2016-03-17 NOTE — Progress Notes (Signed)
PULMONARY / CRITICAL CARE MEDICINE    Name: Kendra Osborne MRN: [id number] DOB: 1946-07-30    ADMISSION DATE:  02/24/2016  PRIMARY SERVICE: PCCM  CHIEF COMPLAINT:  Dyspnea  BRIEF PATIENT DESCRIPTION: 70 y/o woman recently moved from [place] who presents with respiratory distress. She presented to the ED on 3/17 with complaints of dyspnea, and lower extremity edema. History is limited due to language / culture barriers, but she has a PMHx of a known goiter that has been present for years, as well as possibly hypertension. It is not clear if the goiter was ever worked up or if she ever was on regular therapy for her hypertension. Her Nephew and son (who speaks little [place]) report that she been having a cough for a few days as well as some fevers and chills.  SUBJECTIVE:   RT and nurse in room concerned at least 2 aspiration events since trach changed 4/7 from 6 cuffed to 6 uncuffed XLT, Patient not communicative.  Pt transferred to ICU. copius thick yellow secretions per ET. ET changed at bedside to #6 cuffed XLT w/o difficulty. O2 sats improved to 92%.    VITAL SIGNS: Temp:  [98.2 F (36.8 C)-100 F (37.8 C)] 98.6 F (37 C) (04/08 0800) Pulse Rate:  [72-118] 118 (04/08 1140) Resp:  [16-42] 16 (04/08 1140) BP: (110-171)/(41-99) 128/72 mmHg (04/08 1140) SpO2:  [86 %-100 %] 99 % (04/08 1140) FiO2 (%):  [28 %-100 %] 100 % (04/08 1140) Weight:  [210 lb 12.2 oz (95.6 kg)] 210 lb 12.2 oz (95.6 kg) (04/08 0357) HEMODYNAMICS:   VENTILATOR SETTINGS: Vent Mode:  [-] PRVC FiO2 (%):  [28 %-100 %] 100 % Set Rate:  [16 bmp] 16 bmp Vt Set:  [500 mL] 500 mL PEEP:  [8 cmH20] 8 cmH20 Plateau Pressure:  [29 cmH20] 29 cmH20  INTAKE / OUTPUT: Intake/Output      04/07 0701 - 04/08 0700 04/08 0701 - 04/09 0700   I.V. (mL/kg) 70 (0.7)    NG/GT 959.6    IV Piggyback     Total Intake(mL/kg) 1029.6 (10.8)    Urine (mL/kg/hr) 1325 (0.6) 400 (0.9)   Stool 0 (0)    Total Output 1325 400   Net  -295.4 -400        Stool Occurrence 3 x     PHYSICAL EXAMINATION: General: in acute distress.  Trach in place. Increased work of breathing . Copious secretions per trache Integument:  Warm & dry. No rash on exposed skin. Surgical incision on neck clean & dry. HEENT:  Trach in place Cardiovascular:  s1 s2 RRR Pulmonary:  Coarse sounds, diminished. Rhonchi BLF Abdomen: Soft. Normal bowel sounds. Nondistended. PEG Neurological: Spontaneously moving all 4 extremities. Eyes open. Appears grossly nonfocal. Not very interactive  LABS:  CBC  Recent Labs Lab 03/14/16 0415 03/15/16 0421 03/17/16 0521  WBC 8.0 8.5 10.3  HGB 9.6* 9.7* 10.6*  HCT 31.7* 32.4* 36.6  PLT 310 311 334   Coag's  Recent Labs Lab 03/15/16 0421  INR 1.06   BMET  Recent Labs Lab 03/15/16 0421 03/16/16 0500 03/16/16 1030 03/17/16 0521  NA 136 135  --  141  K 3.5 5.4* 3.9 3.5  CL 90* 90*  --  92*  CO2 36* 35*  --  37*  BUN 36* 38*  --  43*  CREATININE 0.72 0.75  --  0.90  GLUCOSE 136* 133*  --  155*   Electrolytes  Recent Labs Lab 03/13/16 0411  03/14/16 0415  03/15/16 0421 03/16/16 0500 03/17/16 0521  CALCIUM 7.8*  --  8.1* 8.3* 8.3* 8.7*  MG 1.7  < > 2.2 2.0 2.2 2.0  PHOS 2.4*  --  4.6 3.4  --   --   < > = values in this interval not displayed. Sepsis Markers No results for input(s): LATICACIDVEN, PROCALCITON, O2SATVEN in the last 168 hours. ABG  Recent Labs Lab 03/17/16 0355  PHART 7.274*  PCO2ART 91.1*  PO2ART 96.9   Liver Enzymes  Recent Labs Lab 03/12/16 0555 03/17/16 0521  AST  --  20  ALT  --  11*  ALKPHOS  --  67  BILITOT  --  0.5  ALBUMIN 2.2* 2.3*   Cardiac Enzymes No results for input(s): TROPONINI, PROBNP in the last 168 hours. Glucose  Recent Labs Lab 03/16/16 0414 03/16/16 0840 03/16/16 1237 03/16/16 1543 03/16/16 2326 03/17/16 0804  GLUCAP 130* 142* 115* 145* 128* 143*   Imaging Ir Gastrostomy Tube Mod Sed  03/15/2016  INDICATION: Respiratory  difficulties.  Respiratory failure.  Encephalopathy. EXAM: PERC PLACEMENT GASTROSTOMY MEDICATIONS: ; Antibiotics were administered within 1 hour of the procedure. Glucagon 1 mg IV ANESTHESIA/SEDATION: Versed One mg IV; Fentanyl 100 mcg IV Moderate Sedation Time:  15 The patient was continuously monitored during the procedure by the interventional radiology nurse under my direct supervision. CONTRAST:  10mL ISOVUE-300 IOPAMIDOL (ISOVUE-300) INJECTION 61% - administered into the gastric lumen. FLUOROSCOPY TIME:  Fluoroscopy Time: 6 minutes 48 seconds (6.1 mGy). COMPLICATIONS: None immediate. PROCEDURE: The procedure, risks, benefits, and alternatives were explained to the patient. Questions regarding the procedure were encouraged and answered. The patient understands and consents to the procedure. The epigastrium was prepped with Betadine in a sterile fashion, and a sterile drape was applied covering the operative field. A sterile gown and sterile gloves were used for the procedure. A 5-French orogastric tube is placed under fluoroscopic guidance. Scout imaging of the abdomen confirms barium within the transverse colon. The stomach was distended with gas. Under fluoroscopic guidance, an 18 gauge needle was utilized to puncture the anterior wall of the body of the stomach. An Amplatz wire was advanced through the needle passing a T fastener into the lumen of the stomach. The T fastener was secured for gastropexy. A 9-French sheath was inserted. A snare was advanced through the 9-French sheath. A Teena Dunk was advanced through the orogastric tube. It was snared then pulled out the oral cavity, pulling the snare, as well. The leading edge of the gastrostomy was attached to the snare. It was then pulled down the esophagus and out the percutaneous site. It was secured in place. Contrast was injected. The image demonstrates placement of a 20-French pull-through type gastrostomy tube into the body of the stomach. IMPRESSION:  Successful 20 French pull-through gastrostomy. Electronically Signed   By: Jolaine Click M.D.   On: 03/15/2016 14:40   Dg Chest Port 1 View  03/17/2016  CLINICAL DATA:  Poor breath sounds, poor air movement, evaluate interval change. EXAM: PORTABLE CHEST 1 VIEW COMPARISON:  03/09/2016 FINDINGS: Tracheostomy tube at the thoracic inlet. There is a right central line, tip in the region of the distal SVC. Progressive elevation of right hemidiaphragm with increased volume loss in the right lung and increased right basilar atelectasis. Left lung is hypo aerated with probable atelectasis at the left lung base. Cardiomediastinal contours are unchanged, but partially obscured. IMPRESSION: Increasing right lung base atelectasis with volume loss and elevation of the right hemidiaphragm. Increased left basilar atelectasis.  Aspiration can cause this appearance. Electronically Signed   By: Rubye OaksMelanie  Ehinger M.D.   On: 03/17/2016 02:45   SIGNIFICANT EVENTS: 3/17 - Admit 3/21 - Thyroidectomy 3/24 - Extubation & reintubation within 2 hours due to hypoxia & secretions 4/6- 24 hr trach collar 4/7- trach change by ENT to 6 uncuffed proximal XLT 4/8 - transferred back to icu for resp distress/asp pna. Trache changed to #6 cuffed XLT at bedside.   STUDIES:  CT-A of Chest >> mild mediastinal enlargement CT of the Neck with contrast >> multinodular goiter, meningioma Port CXR 3/22:  ETT & CVL in good position. Blunting bilateral costophrenic angles suggestive of effusions. Bilateral hilar fullness & interstitial prominence with low lung volumes.  Port CXR 3/25:. Silhouetting of the left hemidiaphragm. Platelike atelectasis right lower lung. Endotracheal tube in good position. Ort CXR 4/8: incr bibasilar atelectasis  LINES / TUBES: OETT 7.5 3/17 - 3/24; 7.0 3/24>>>3/31 Trach (ENT) 3/31>>> L Subclavian CVL 3/21>>> L Port 3/21>>> OGT 3/21 - 3/24; 3/24>>> Foley 3/18>>> PIV x1 L Radial Art Line 3/21 - 3/23 R & L Neck  Drains 3/21 - ???  MICROBIOLOGY: Tracheal Asp Ctx 3/25>>> ng Blood Ctx x2 3/18:  Negative  MRSA PCR 3/18:  Negative   ANTIBIOTICS: Unasyn 3/24>>> Ancef 3/21 - 3/24 Clindamycin 3/21 (only 1 dose) Azithromycin 3/18 - 3/19 Rocephin 3/18 - 3/20  ASSESSMENT / PLAN:  PULMONARY A: Acute on Chronic Hypercarbic Respiratory Failure Acute Hypoxic Respiratory Failure - Question element of pulmonary edema vs viral bronchitis/pneumonitis. Extrinsic Airway Compression - Secondary to goiter. S/P total thyroidectomy 3/21. Recurrent aspiration since change to uncuffed trach 4/7 ABG 3:55 AM 4/8- 7.27/ pCO2 91/ pO2 96.9/ HCO3 40.7 Pt's trache changed to cuffed trache on 4/7 P:   Start vent support Vent bundle. Send trache secretions. Cont current abx. Hold TF  CARDIOVASCULAR A: HTN - TTE 3/19 nml LV fn, gr 1 DD.  P:   Hydralazine IV PRN, not needed Lasix prn tele  RENAL A: Hypokalemia - Replacing. Hypernatremia - Mild but stable. Hypomagnesemia - Resolved. Hyponatremia - Resolved. Acute Renal Failure - Resolved.  P:   D5 1/2 NS 50 mls/ hr  GASTROINTESTINAL A: dyspahgia  P:   Hold TF 2/2 aspiration Protonix daily.  HEMATOLOGIC A: Anemia - Mild.  P:   Limit phlebotomy SCDs. Continue Heparin [place] q8hr.  INFECTIOUS A: Aspiration Pneumonia-treated P:   Send trache secretions Monitor for fever & leukocytosis.   ENDOCRINE A: S/P Total Thyroidectomy - Previously normal TSH.  P:   Monitor calcium level w/ daily labs. Synthroid 100mcg VT daily  tsh in 6 weeks  NEUROLOGIC A: Post-Op Pain  P:   RASS Goal: 0  Versed IV prn Klonipin to 2 mg PO BID, consider slow reduction  Seroquel to 25 mg TID, could escalate if n eeded Continue fentanyl patch 75 mcg  Fentanyl prn     J. Alexis FrockAngelo A de Dios, MD 03/17/2016, 11:53 AM [facility] Pager [phone] After 3 pm or if no answer, call [phone]

## 2016-03-17 NOTE — Progress Notes (Signed)
Called to see patient due to respiratory distress. Pt had trach change from cuffed to cuffless earlier in the day. Bedside RN reported marked change in trach secretions after turning pt earlier in the shift from clear sputum to thick, yellow secretions that smell like tube feeds. Pt became more tachypneic and hypoxic. With suction, quantity of secretions diminished and hypoxia improved from mid 80s to mid 90s. Tachypneia is moderately better, but still, RR remains high around 40. CXR shows low lung volumes. Discussed with primary NP at bedside with bedside RN. Plan to keep patient here, change back to cuffed trach, and if deteriorates further or fails to improve, then bring back to ICU.  Jamie KatoAaron Holly Pring, MD Pulmonary & Critical Care Medicine March 17, 2016, 3:45 AM

## 2016-03-18 ENCOUNTER — Inpatient Hospital Stay (HOSPITAL_COMMUNITY): Payer: Medicaid Other

## 2016-03-18 DIAGNOSIS — J690 Pneumonitis due to inhalation of food and vomit: Secondary | ICD-10-CM | POA: Diagnosis present

## 2016-03-18 LAB — CBC WITH DIFFERENTIAL/PLATELET
Basophils Absolute: 0 K/uL (ref 0.0–0.1)
Basophils Relative: 0 %
Eosinophils Absolute: 0.2 K/uL (ref 0.0–0.7)
Eosinophils Relative: 1 %
HCT: 33.1 % — ABNORMAL LOW (ref 36.0–46.0)
Hemoglobin: 10.1 g/dL — ABNORMAL LOW (ref 12.0–15.0)
Lymphocytes Relative: 18 %
Lymphs Abs: 2.4 K/uL (ref 0.7–4.0)
MCH: 26.8 pg (ref 26.0–34.0)
MCHC: 30.5 g/dL (ref 30.0–36.0)
MCV: 87.8 fL (ref 78.0–100.0)
Monocytes Absolute: 1 K/uL (ref 0.1–1.0)
Monocytes Relative: 7 %
Neutro Abs: 10.4 K/uL — ABNORMAL HIGH (ref 1.7–7.7)
Neutrophils Relative %: 74 %
Platelets: 310 K/uL (ref 150–400)
RBC: 3.77 MIL/uL — ABNORMAL LOW (ref 3.87–5.11)
RDW: 19.3 % — ABNORMAL HIGH (ref 11.5–15.5)
WBC: 14 K/uL — ABNORMAL HIGH (ref 4.0–10.5)

## 2016-03-18 LAB — COMPREHENSIVE METABOLIC PANEL
ALBUMIN: 2.1 g/dL — AB (ref 3.5–5.0)
ALK PHOS: 65 U/L (ref 38–126)
ALT: 11 U/L — AB (ref 14–54)
AST: 22 U/L (ref 15–41)
Anion gap: 13 (ref 5–15)
BILIRUBIN TOTAL: 0.7 mg/dL (ref 0.3–1.2)
BUN: 52 mg/dL — ABNORMAL HIGH (ref 6–20)
CALCIUM: 8.7 mg/dL — AB (ref 8.9–10.3)
CO2: 36 mmol/L — AB (ref 22–32)
CREATININE: 1.26 mg/dL — AB (ref 0.44–1.00)
Chloride: 95 mmol/L — ABNORMAL LOW (ref 101–111)
GFR calc non Af Amer: 43 mL/min — ABNORMAL LOW (ref >60)
GFR, EST AFRICAN AMERICAN: 50 mL/min — AB (ref >60)
GLUCOSE: 125 mg/dL — AB (ref 65–99)
Potassium: 3.3 mmol/L — ABNORMAL LOW (ref 3.5–5.1)
SODIUM: 144 mmol/L (ref 135–145)
TOTAL PROTEIN: 7.1 g/dL (ref 6.5–8.1)

## 2016-03-18 LAB — MAGNESIUM: MAGNESIUM: 2 mg/dL (ref 1.7–2.4)

## 2016-03-18 LAB — GLUCOSE, CAPILLARY
Glucose-Capillary: 112 mg/dL — ABNORMAL HIGH (ref 65–99)
Glucose-Capillary: 117 mg/dL — ABNORMAL HIGH (ref 65–99)
Glucose-Capillary: 118 mg/dL — ABNORMAL HIGH (ref 65–99)
Glucose-Capillary: 120 mg/dL — ABNORMAL HIGH (ref 65–99)
Glucose-Capillary: 125 mg/dL — ABNORMAL HIGH (ref 65–99)
Glucose-Capillary: 129 mg/dL — ABNORMAL HIGH (ref 65–99)

## 2016-03-18 MED ORDER — FENTANYL CITRATE (PF) 100 MCG/2ML IJ SOLN
100.0000 ug | INTRAMUSCULAR | Status: DC | PRN
  Administered 2016-03-18 – 2016-03-22 (×17): 100 ug via INTRAVENOUS
  Filled 2016-03-18 (×19): qty 2

## 2016-03-18 MED ORDER — QUETIAPINE FUMARATE 25 MG PO TABS
25.0000 mg | ORAL_TABLET | Freq: Three times a day (TID) | ORAL | Status: DC
  Administered 2016-03-18 – 2016-03-19 (×2): 25 mg via ORAL
  Filled 2016-03-18 (×3): qty 1

## 2016-03-18 MED ORDER — VITAL AF 1.2 CAL PO LIQD
20.0000 mL/h | ORAL | Status: AC
  Administered 2016-03-18: 20 mL/h

## 2016-03-18 MED ORDER — POTASSIUM CHLORIDE 20 MEQ/15ML (10%) PO SOLN
40.0000 meq | Freq: Once | ORAL | Status: AC
  Administered 2016-03-18: 40 meq
  Filled 2016-03-18: qty 30

## 2016-03-18 MED ORDER — VITAL AF 1.2 CAL PO LIQD: 1000.0000 mL | ORAL | Status: DC

## 2016-03-18 NOTE — Progress Notes (Signed)
Patient continues to have agitation and restlessness. She is fully awake on trach/vent. Notified CCM resident on-call. New orders received to restart seroquel and increase fentanyl dose. Will continue to monitor and notify MD as needed.

## 2016-03-18 NOTE — Progress Notes (Signed)
eLink Physician-Brief Progress Note Patient Name: Kendra Osborne DOB: 05/29/1946 MRN: [id number]   Date of Service  03/18/2016  HPI/Events of Note  Hypokalemia  eICU Interventions  Potassium replaced     Intervention Category Minor Interventions: Electrolytes abnormality - evaluation and management  Willian Donson 03/18/2016, 6:16 AM

## 2016-03-18 NOTE — Progress Notes (Signed)
PULMONARY / CRITICAL CARE MEDICINE    Name: Kendra Osborne MRN: [id number] DOB: 1946/07/09    ADMISSION DATE:  02/24/2016  PRIMARY SERVICE: PCCM  CHIEF COMPLAINT:  Dyspnea  BRIEF PATIENT DESCRIPTION: 70 y/o woman recently moved from [place] who presents with respiratory distress. She presented to the ED on 3/17 with complaints of dyspnea, and lower extremity edema. History is limited due to language / culture barriers, but she has a PMHx of a known goiter that has been present for years, as well as possibly hypertension. It is not clear if the goiter was ever worked up or if she ever was on regular therapy for her hypertension. Her Nephew and son (who speaks little [place]) report that she been having a cough for a few days as well as some fevers and chills.  SUBJECTIVE:    Pt transferred to ICU on 4/8. copius thick yellow secretions per ET. ET changed at bedside to #6 cuffed XLT w/o difficulty. O2 sats improved to 92%.   No issues overnight. TC this am.    VITAL SIGNS: Temp:  [98.4 F (36.9 C)-101.2 F (38.4 C)] 98.5 F (36.9 C) (04/09 0900) Pulse Rate:  [62-118] 70 (04/09 0910) Resp:  [0-26] 16 (04/09 0910) BP: (85-224)/(46-188) 187/64 mmHg (04/09 0910) SpO2:  [95 %-100 %] 95 % (04/09 0910) FiO2 (%):  [30 %-100 %] 40 % (04/09 0910) Weight:  [209 lb 7 oz (95 kg)-210 lb 5.1 oz (95.4 kg)] 209 lb 7 oz (95 kg) (04/09 0500) HEMODYNAMICS:   VENTILATOR SETTINGS: Vent Mode:  [-] PRVC FiO2 (%):  [30 %-100 %] 40 % Set Rate:  [16 bmp] 16 bmp Vt Set:  [420 mL-500 mL] 420 mL PEEP:  [8 cmH20] 8 cmH20 Plateau Pressure:  [20 cmH20-29 cmH20] 20 cmH20  INTAKE / OUTPUT: Intake/Output      04/08 0701 - 04/09 0700 04/09 0701 - 04/10 0700   I.V. (mL/kg) 913.3 (9.6) 50 (0.5)   Other 135    NG/GT     IV Piggyback 200    Total Intake(mL/kg) 1248.3 (13.1) 50 (0.5)   Urine (mL/kg/hr) 1005 (0.4) 75 (0.4)   Stool     Total Output 1005 75   Net +243.3 -25         PHYSICAL  EXAMINATION: General: comfortable.  Trach in place.  Less secretions per trache Integument:  Warm & dry. No rash on exposed skin. Surgical incision on neck clean & dry. HEENT:  Trach in place > less secretions Cardiovascular:  s1 s2 RRR Pulmonary:  Coarse sounds, diminished. Rhonchi BLF > less Abdomen: Soft. Normal bowel sounds. Nondistended. PEG Neurological: Spontaneously moving all 4 extremities. Eyes open. Appears grossly nonfocal. Not very interactive  LABS:  CBC  Recent Labs Lab 03/15/16 0421 03/17/16 0521 03/18/16 0403  WBC 8.5 10.3 14.0*  HGB 9.7* 10.6* 10.1*  HCT 32.4* 36.6 33.1*  PLT 311 334 310   Coag's  Recent Labs Lab 03/15/16 0421  INR 1.06   BMET  Recent Labs Lab 03/16/16 0500 03/16/16 1030 03/17/16 0521 03/18/16 0403  NA 135  --  141 144  K 5.4* 3.9 3.5 3.3*  CL 90*  --  92* 95*  CO2 35*  --  37* 36*  BUN 38*  --  43* 52*  CREATININE 0.75  --  0.90 1.26*  GLUCOSE 133*  --  155* 125*   Electrolytes  Recent Labs Lab 03/13/16 0411  03/14/16 0415 03/15/16 0421 03/16/16 0500 03/17/16 0521 03/18/16 0403  CALCIUM  7.8*  --  8.1* 8.3* 8.3* 8.7* 8.7*  MG 1.7  < > 2.2 2.0 2.2 2.0 2.0  PHOS 2.4*  --  4.6 3.4  --   --   --   < > = values in this interval not displayed. Sepsis Markers No results for input(s): LATICACIDVEN, PROCALCITON, O2SATVEN in the last 168 hours. ABG  Recent Labs Lab 03/17/16 0355 03/17/16 1253  PHART 7.274* 7.388  PCO2ART 91.1* 67.8*  PO2ART 96.9 121.0*   Liver Enzymes  Recent Labs Lab 03/12/16 0555 03/17/16 0521 03/18/16 0403  AST  --  20 22  ALT  --  11* 11*  ALKPHOS  --  67 65  BILITOT  --  0.5 0.7  ALBUMIN 2.2* 2.3* 2.1*   Cardiac Enzymes No results for input(s): TROPONINI, PROBNP in the last 168 hours. Glucose  Recent Labs Lab 03/17/16 0804 03/17/16 1219 03/17/16 1627 03/17/16 2022 03/18/16 0044 03/18/16 0418  GLUCAP 143* 159* 155* 140* 125* 112*   Imaging Portable Chest Xray  03/18/2016   CLINICAL DATA:  Respiratory failure. EXAM: PORTABLE CHEST 1 VIEW COMPARISON:  03/17/2016 FINDINGS: Slightly increased elevation of the right hemidiaphragm probably represents volume loss. Improved aeration at the left lung base. Tracheostomy tube appears to be well positioned. There is a right arm PICC line terminates in the upper right atrium region but unchanged. Heart size is stable. IMPRESSION: Elevation of the right hemidiaphragm is suggestive for volume loss. Improved aeration at the left lung base. Stable appearance of the support apparatuses. Electronically Signed   By: Richarda OverlieAdam  Henn M.D.   On: 03/18/2016 08:22   Dg Chest Port 1 View  03/17/2016  CLINICAL DATA:  Poor breath sounds, poor air movement, evaluate interval change. EXAM: PORTABLE CHEST 1 VIEW COMPARISON:  03/09/2016 FINDINGS: Tracheostomy tube at the thoracic inlet. There is a right central line, tip in the region of the distal SVC. Progressive elevation of right hemidiaphragm with increased volume loss in the right lung and increased right basilar atelectasis. Left lung is hypo aerated with probable atelectasis at the left lung base. Cardiomediastinal contours are unchanged, but partially obscured. IMPRESSION: Increasing right lung base atelectasis with volume loss and elevation of the right hemidiaphragm. Increased left basilar atelectasis. Aspiration can cause this appearance. Electronically Signed   By: Rubye OaksMelanie  Ehinger M.D.   On: 03/17/2016 02:45   SIGNIFICANT EVENTS: 3/17 - Admit 3/21 - Thyroidectomy 3/24 - Extubation & reintubation within 2 hours due to hypoxia & secretions 4/6- 24 hr trach collar 4/7- trach change by ENT to 6 uncuffed proximal XLT 4/8 - transferred back to icu for resp distress/asp pna. Trache changed to #6 cuffed XLT at bedside.   STUDIES:  CT-A of Chest >> mild mediastinal enlargement CT of the Neck with contrast >> multinodular goiter, meningioma Port CXR 3/22:  ETT & CVL in good position. Blunting bilateral  costophrenic angles suggestive of effusions. Bilateral hilar fullness & interstitial prominence with low lung volumes.  Port CXR 3/25:. Silhouetting of the left hemidiaphragm. Platelike atelectasis right lower lung. Endotracheal tube in good position. Ort CXR 4/8: incr bibasilar atelectasis  LINES / TUBES: OETT 7.5 3/17 - 3/24; 7.0 3/24>>>3/31 Trach (ENT) 3/31>>> L Subclavian CVL 3/21>>> L Port 3/21>>> OGT 3/21 - 3/24; 3/24>>> Foley 3/18>>> PIV x1 L Radial Art Line 3/21 - 3/23 R & L Neck Drains 3/21 - ???  MICROBIOLOGY: Tracheal Asp Ctx 3/25>>> ng Blood Ctx x2 3/18:  Negative  MRSA PCR 3/18:  Negative  Trache asp  4/8 >   ANTIBIOTICS: Unasyn 3/24>>> Ancef 3/21 - 3/24 Clindamycin 3/21 (only 1 dose) Azithromycin 3/18 - 3/19 Rocephin 3/18 - 3/20  ASSESSMENT / PLAN:  PULMONARY A: Acute on Chronic Hypercarbic Respiratory Failure Acute Hypoxic Respiratory Failure - Question element of pulmonary edema vs viral bronchitis/pneumonitis. Extrinsic Airway Compression - Secondary to goiter. S/P total thyroidectomy 3/21. Recurrent aspiration since change to uncuffed trach 4/7 Pt's trache changed to cuffed trache on 4/7 > on TC trial P:   TC as tolerated, vent prn.  If off the vent overnight, transfer out of ICU in am.  Start trickle feeds (on cuffed trache) and observe for asp    CARDIOVASCULAR A: HTN - TTE 3/19 nml LV fn, gr 1 DD.  P:   Hydralazine IV PRN, not needed Lasix prn tele  RENAL A: Hypokalemia - Replacing. Hypernatremia - Mild but stable. Hypomagnesemia - Resolved. Hyponatremia - Resolved. Acute Renal Failure - Resolved.  P:   D5 1/2 NS 50 mls/ hr > d/c on on full TF  GASTROINTESTINAL A: dyspahgia S/P PEG Concern for asp pna while on cuffless trache  P:   Start Trickle feed > advance as tolerated. Allegedly aspirating while on cuffless and cuffed trache.  Protonix daily.  HEMATOLOGIC A: Anemia - Mild.  P:   Limit phlebotomy SCDs. Continue  Heparin [place] q8hr.  INFECTIOUS A: Aspiration Pneumonia-treated P:   Send trache secretions > f/u Monitor for fever & leukocytosis.   ENDOCRINE A: S/P Total Thyroidectomy - Previously normal TSH.  P:   Monitor calcium level w/ daily labs. Synthroid VT daily  tsh in 6 weeks  NEUROLOGIC A: Post-Op Pain Anxiety  P:   RASS Goal: 0  Versed IV prn Klonipin to 2 mg PO BID, consider slow reduction  Seroquel to 25 mg TID, could escalate if n eeded Continue fentanyl patch 75 mcg  Fentanyl prn  Critical care time with thi patient today : 34 minutes No family around   J. Alexis Frock, MD 03/18/2016, 9:15 AM [facility] Pager [phone] After 3 pm or if no answer, call [phone]

## 2016-03-19 LAB — MAGNESIUM: Magnesium: 2 mg/dL (ref 1.7–2.4)

## 2016-03-19 LAB — GLUCOSE, CAPILLARY
GLUCOSE-CAPILLARY: 124 mg/dL — AB (ref 65–99)
Glucose-Capillary: 116 mg/dL — ABNORMAL HIGH (ref 65–99)
Glucose-Capillary: 116 mg/dL — ABNORMAL HIGH (ref 65–99)
Glucose-Capillary: 121 mg/dL — ABNORMAL HIGH (ref 65–99)
Glucose-Capillary: 125 mg/dL — ABNORMAL HIGH (ref 65–99)
Glucose-Capillary: 137 mg/dL — ABNORMAL HIGH (ref 65–99)

## 2016-03-19 LAB — COMPREHENSIVE METABOLIC PANEL
ALBUMIN: 2.3 g/dL — AB (ref 3.5–5.0)
ALK PHOS: 64 U/L (ref 38–126)
ALT: 10 U/L — AB (ref 14–54)
AST: 23 U/L (ref 15–41)
Anion gap: 13 (ref 5–15)
BILIRUBIN TOTAL: 0.7 mg/dL (ref 0.3–1.2)
BUN: 44 mg/dL — AB (ref 6–20)
CALCIUM: 8.9 mg/dL (ref 8.9–10.3)
CO2: 34 mmol/L — ABNORMAL HIGH (ref 22–32)
CREATININE: 1.04 mg/dL — AB (ref 0.44–1.00)
Chloride: 100 mmol/L — ABNORMAL LOW (ref 101–111)
GFR calc Af Amer: >60 mL/min (ref >60)
GFR, EST NON AFRICAN AMERICAN: 54 mL/min — AB (ref >60)
GLUCOSE: 137 mg/dL — AB (ref 65–99)
POTASSIUM: 3.5 mmol/L (ref 3.5–5.1)
Sodium: 147 mmol/L — ABNORMAL HIGH (ref 135–145)
TOTAL PROTEIN: 7.6 g/dL (ref 6.5–8.1)

## 2016-03-19 MED ORDER — VITAL HIGH PROTEIN PO LIQD
1000.0000 mL | ORAL | Status: DC
  Administered 2016-03-19 – 2016-04-02 (×15): 1000 mL
  Filled 2016-03-19 (×20): qty 1000

## 2016-03-19 MED ORDER — QUETIAPINE FUMARATE 50 MG PO TABS
50.0000 mg | ORAL_TABLET | Freq: Two times a day (BID) | ORAL | Status: DC
  Administered 2016-03-19 – 2016-03-20 (×2): 50 mg via ORAL
  Filled 2016-03-19 (×2): qty 1

## 2016-03-19 NOTE — Progress Notes (Signed)
PULMONARY / CRITICAL CARE MEDICINE    Name: Kendra Osborne MRN: [id number] DOB: 09/21/1946    ADMISSION DATE:  02/24/2016  PRIMARY SERVICE: PCCM  CHIEF COMPLAINT:  Dyspnea  BRIEF PATIENT DESCRIPTION: 70 y/o woman recently moved from [name] who presents with respiratory distress. She presented to the ED on 3/17 with complaints of dyspnea, and lower extremity edema. History is limited due to language / culture barriers, but she has a PMHx of a known goiter that has been present for years, as well as possibly hypertension. It is not clear if the goiter was ever worked up or if she ever was on regular therapy for her hypertension. Her Nephew and son (who speaks little [place]) report that she been having a cough for a few days as well as some fevers and chills.  SUBJECTIVE:   Tolerated PSV for 45 minutes, desaturated with increased secretions   VITAL SIGNS: Temp:  [98.5 F (36.9 C)-99.3 F (37.4 C)] 99 F (37.2 C) (04/10 0843) Pulse Rate:  [63-79] 67 (04/10 1256) Resp:  [15-35] 19 (04/10 1256) BP: (106-186)/(55-94) 158/79 mmHg (04/10 1256) SpO2:  [88 %-100 %] 96 % (04/10 1256) FiO2 (%):  [40 %] 40 % (04/10 1256) Weight:  [97 kg (213 lb 13.5 oz)] 97 kg (213 lb 13.5 oz) (04/10 0416) HEMODYNAMICS:   VENTILATOR SETTINGS: Vent Mode:  [-] PRVC FiO2 (%):  [40 %] 40 % Set Rate:  [16 bmp] 16 bmp Vt Set:  [420 mL] 420 mL PEEP:  [5 cmH20-8 cmH20] 5 cmH20 Plateau Pressure:  [18 cmH20-20 cmH20] 19 cmH20  INTAKE / OUTPUT: Intake/Output      04/09 0701 - 04/10 0700 04/10 0701 - 04/11 0700   I.V. (mL/kg) 1200 (12.4) 235 (2.4)   Other  115   NG/GT 427.3 100   IV Piggyback 200 50   Total Intake(mL/kg) 1827.3 (18.8) 500 (5.2)   Urine (mL/kg/hr) 1825 (0.8) 455 (0.8)   Total Output 1825 455   Net +2.3 +45         PHYSICAL EXAMINATION: General: comfortable.  Trach in place.  Obese woman Integument:  Warm & dry. No rash on exposed skin. Surgical incision on neck clean & dry. HEENT:  Trach  in place, poor dentition Cardiovascular:  s1 s2 RRR Pulmonary:  Coarse sounds, diminished. Rhonchi BLF > less Abdomen: Soft. Normal bowel sounds. Nondistended. PEG Neurological: Spontaneously moving all 4 extremities. Eyes open. Appears grossly nonfocal. Not very interactive  LABS:  CBC  Recent Labs Lab 03/15/16 0421 03/17/16 0521 03/18/16 0403  WBC 8.5 10.3 14.0*  HGB 9.7* 10.6* 10.1*  HCT 32.4* 36.6 33.1*  PLT 311 334 310   Coag's  Recent Labs Lab 03/15/16 0421  INR 1.06   BMET  Recent Labs Lab 03/17/16 0521 03/18/16 0403 03/19/16 0437  NA 141 144 147*  K 3.5 3.3* 3.5  CL 92* 95* 100*  CO2 37* 36* 34*  BUN 43* 52* 44*  CREATININE 0.90 1.26* 1.04*  GLUCOSE 155* 125* 137*   Electrolytes  Recent Labs Lab 03/13/16 0411  03/14/16 0415 03/15/16 0421  03/17/16 0521 03/18/16 0403 03/19/16 0437  CALCIUM 7.8*  --  8.1* 8.3*  < > 8.7* 8.7* 8.9  MG 1.7  < > 2.2 2.0  < > 2.0 2.0 2.0  PHOS 2.4*  --  4.6 3.4  --   --   --   --   < > = values in this interval not displayed. Sepsis Markers No results for input(s): LATICACIDVEN,  PROCALCITON, O2SATVEN in the last 168 hours. ABG  Recent Labs Lab 03/17/16 0355 03/17/16 1253  PHART 7.274* 7.388  PCO2ART 91.1* 67.8*  PO2ART 96.9 121.0*   Liver Enzymes  Recent Labs Lab 03/17/16 0521 03/18/16 0403 03/19/16 0437  AST ALT 11* 11* 10*  ALKPHOS 67 65 64  BILITOT 0.5 0.7 0.7  ALBUMIN 2.3* 2.1* 2.3*   Cardiac Enzymes No results for input(s): TROPONINI, PROBNP in the last 168 hours. Glucose  Recent Labs Lab 03/18/16 1647 03/18/16 1942 03/19/16 0015 03/19/16 0356 03/19/16 0841 03/19/16 1200  GLUCAP 118* 117* 121* 124* 116* 137*   Imaging Portable Chest Xray  03/18/2016  CLINICAL DATA:  Respiratory failure. EXAM: PORTABLE CHEST 1 VIEW COMPARISON:  03/17/2016 FINDINGS: Slightly increased elevation of the right hemidiaphragm probably represents volume loss. Improved aeration at the left lung  base. Tracheostomy tube appears to be well positioned. There is a right arm PICC line terminates in the upper right atrium region but unchanged. Heart size is stable. IMPRESSION: Elevation of the right hemidiaphragm is suggestive for volume loss. Improved aeration at the left lung base. Stable appearance of the support apparatuses. Electronically Signed   By: Richarda Overlie M.D.   On: 03/18/2016 08:22   SIGNIFICANT EVENTS: 3/17 - Admit 3/21 - Thyroidectomy 3/24 - Extubation & reintubation within 2 hours due to hypoxia & secretions 4/6- 24 hr trach collar 4/7- trach change by ENT to 6 uncuffed proximal XLT 4/8 - transferred back to icu for resp distress/asp pna. Trache changed to #6 cuffed XLT at bedside.   STUDIES:  CT-A of Chest >> mild mediastinal enlargement CT of the Neck with contrast >> multinodular goiter, meningioma Port CXR 3/22:  ETT & CVL in good position. Blunting bilateral costophrenic angles suggestive of effusions. Bilateral hilar fullness & interstitial prominence with low lung volumes.  Port CXR 3/25:. Silhouetting of the left hemidiaphragm. Platelike atelectasis right lower lung. Endotracheal tube in good position. Ort CXR 4/8: incr bibasilar atelectasis  LINES / TUBES: OETT 7.5 3/17 - 3/24; 7.0 3/24>>>3/31 Trach (ENT) 3/31>>> L Subclavian CVL 3/21>>> L Port 3/21>>> OGT 3/21 - 3/24; 3/24>>> Foley 3/18>>> PIV x1 L Radial Art Line 3/21 - 3/23 R & L Neck Drains 3/21 - ???  MICROBIOLOGY: Tracheal Asp Ctx 3/25>>> ng Blood Ctx x2 3/18:  Negative  MRSA PCR 3/18:  Negative  Trache asp 4/8 >   ANTIBIOTICS: Unasyn 3/24>>> Ancef 3/21 - 3/24 Clindamycin 3/21 (only 1 dose) Azithromycin 3/18 - 3/19 Rocephin 3/18 - 3/20  ASSESSMENT / PLAN:  PULMONARY A: Acute on Chronic Hypercarbic Respiratory Failure Acute Hypoxic Respiratory Failure - Question element of pulmonary edema vs viral bronchitis/pneumonitis. Extrinsic Airway Compression - Secondary to goiter. S/P total  thyroidectomy 3/21. Recurrent aspiration since change to uncuffed trach 4/7 Pt's trach changed to cuffed trach on 4/7 > on TC trial P:   ATC as tolerated, currently back to MV Advance TF, follow to insure no evidence aspiration    CARDIOVASCULAR A: HTN - TTE 3/19 nml LV fn, gr 1 DD.  P:   Hydralazine IV PRN, not needed Lasix prn tele  RENAL A: Hypokalemia - Replacing. Hypernatremia - Mild but stable. Hypomagnesemia - Resolved. Hyponatremia - Resolved. Acute Renal Failure - Resolved.  P:   D5 1/2 NS 50 mls/ hr > d/c on on full TF  GASTROINTESTINAL A: dyspahgia S/P PEG Concern for asp pna while on cuffless trache  P:   TF advance as tolerated.  Protonix daily.  HEMATOLOGIC A: Anemia - Mild.  P:   Limit phlebotomy SCDs. Continue Heparin [place] q8hr.  INFECTIOUS A: Aspiration Pneumonia-treated P:   Cx trach secretions > f/u Monitor for fever & leukocytosis.   ENDOCRINE A: S/P Total Thyroidectomy - Previously normal TSH.  P:   Monitor calcium level w/ daily labs. Synthroid VT daily  tsh in 6 weeks  NEUROLOGIC A: Post-Op Pain Anxiety  P:   RASS Goal: 0  Versed IV prn Klonipin stopped > consider add back low dose Seroquel to  BID, could escalate if n eeded Continue fentanyl patch 75 mcg  Fentanyl prn    Levy Pupa, MD, PhD 03/19/2016, 1:11 PM [facility] [phone] or if no answer [phone]

## 2016-03-19 NOTE — Progress Notes (Signed)
Physical Therapy Treatment Patient Details Name: Kendra Osborne MRN: [id number] DOB: 10/05/1946 Today's Date: 03/19/2016    History of Present Illness pt presents with Large thyroid mass/goiter and Respiratory Difficulties.  pt has underwent Total Thyroidectomy with goiter removal and has remained intubated with one failed extubation on 3/24.  pt recently came [place] from [place] and only known medical hx is HTN and Goiter.      PT Comments    Pt participated in therapy well today. Pt can sit with her back unsupported on the EOB but both UE's and LE's must be supported which is challenging on the EOB due to pt's short stature. Did not attempt transfer as pt's feet are too far off the floor to attempt safely. Pt follows some commands with gestures, but sessions are limited due to communication difficulty due to trach and language barrier. Pt has not met any PT goals at this time and goals have been updated to be more appropriate based on the pt's current presentation. Pt continues to benefit from PT services to improve functional mobility and would benefit from CIR once medically appropriate.    Follow Up Recommendations  CIR;Supervision/Assistance - 24 hour     Equipment Recommendations  None recommended by PT    Recommendations for Other Services       Precautions / Restrictions Precautions Precautions: Fall Precaution Comments: Trach, Vent Restrictions Weight Bearing Restrictions: No    Mobility  Bed Mobility Overal bed mobility: Needs Assistance;+2 for physical assistance Bed Mobility: Rolling;Sit to Supine;Sidelying to Sit Rolling: Mod assist;+2 for physical assistance Sidelying to sit: Max assist;+2 for physical assistance;HOB elevated   Sit to supine: Max assist;+2 for physical assistance   General bed mobility comments: Pt tries to bring feet to the EOB but has trouble. Pt needs Max A to come from sidelying to sit. Max to scoot up in bed after PT session. Pt sat EOB  for ~5 mins. Needs feet supported to maintain balance without assistance.   Transfers                 General transfer comment: unable to attempt   Ambulation/Gait                 Stairs            Wheelchair Mobility    Modified Rankin (Stroke Patients Only)       Balance Overall balance assessment: Needs assistance Sitting-balance support: Feet supported;Bilateral upper extremity supported Sitting balance-Leahy Scale: Poor Sitting balance - Comments: Pt able to sit EOB if both feet and arms are supported. Fatigues quickly. Brought a chair to the EOB to support her feet as they do not reach the floor. Sat EOB for ~5 minutes.  Postural control: Posterior lean;Right lateral lean                          Cognition Arousal/Alertness: Awake/alert Behavior During Therapy: Flat affect Overall Cognitive Status: Difficult to assess                      Exercises      General Comments General comments (skin integrity, edema, etc.): Pt repeatedly guesturing to mouth and opening her mouth.       Pertinent Vitals/Pain Pain Assessment: Faces Faces Pain Scale: Hurts whole lot Pain Location: Unable to determine where, Grimaces throughout movement Pain Descriptors / Indicators: Discomfort;Grimacing;Guarding Pain Intervention(s): Monitored during session;Repositioned  Vitals stable throughout   session on Vent. FiO2 40%, PEEP 5. BP: 200/65 at end of session. RN notified and aware.     Home Living                      Prior Function            PT Goals (current goals can now be found in the care plan section) Acute Rehab PT Goals Patient Stated Goal: Unable to state. PT Goal Formulation: Patient unable to participate in goal setting Time For Goal Achievement: 04/02/16 Potential to Achieve Goals: Good Progress towards PT goals: Goals downgraded-see care plan    Frequency  Min 3X/week    PT Plan Current plan remains appropriate     Co-evaluation             End of Session Equipment Utilized During Treatment: Oxygen Activity Tolerance: Patient tolerated treatment well Patient left: in bed;with call bell/phone within reach;with bed alarm set     Time: 1321-1338 PT Time Calculation (min) (ACUTE ONLY): 17 min  Charges:  $Therapeutic Activity: 8-22 mins                    G Codes:       , SPT   03/19/2016, 4:05 PM   

## 2016-03-19 NOTE — Progress Notes (Signed)
Nutrition Follow-up  DOCUMENTATION CODES:   Morbid obesity  INTERVENTION:    Change TF to Vital High Protein at goal rate of 55 ml/h to provide 1320 kcals, 116 gm protein, 1104 ml free water daily.  NUTRITION DIAGNOSIS:   Inadequate oral intake related to inability to eat as evidenced by NPO status.  Ongoing  GOAL:   Provide needs based on ASPEN/SCCM guidelines  Unmet  MONITOR:   I & O's, Skin, TF tolerance, Labs  ASSESSMENT:   70 y/o woman recently moved from [name] who presents with respiratory distress. 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  Labs reviewed: sodium elevated. S/P trach on 3/31 and PEG on 4/6. S/P suspected aspiration event on 4/8. Transferred back to the ICU, now back on the ventilator.  Patient is currently receiving Vital AF 1.2 via PEG at 20 ml/h (480 ml/day) to provide 576 kcals, 36 gm protein, 389 ml free water daily.  Discussed patient in ICU rounds and with RN today. Okay to advance TF to goal rate.  Patient is currently intubated on ventilator support MV: 8.2 L/min Temp (24hrs), Avg:99.1 F (37.3 C), Min:98.5 F (36.9 C), Max:99.3 F (37.4 C)   Diet Order:  Diet NPO time specified  Skin:  Wound (see comment) (closed incision on neck)  Last BM:  4/8  Height:   Ht Readings from Last 1 Encounters:  02/24/16 5' (1.524 m)    Weight:   Wt Readings from Last 1 Encounters:  03/19/16 213 lb 13.5 oz (97 kg)    Ideal Body Weight:  45.45 kg  BMI:  Body mass index is 41.76 kg/(m^2).  Estimated Nutritional Needs:   Kcal:  [id number] (while on ventilator) 1500-1600 (when off vent support)  Protein:  100-115 grams  Fluid:  >/= 1.5 L  EDUCATION NEEDS:   No education needs identified at this time  Joaquin CourtsKimberly Harris, RD, LDN, CNSC Pager [phone] After Hours Pager [phone]

## 2016-03-19 NOTE — Progress Notes (Signed)
SLP Cancellation Note  Patient Details Name: Kendra Osborne MRN: [id number] DOB: 01/07/1946   Cancelled treatment:       Reason Eval/Treat Not Completed: Medical issues which prohibited therapy. Per chart, pt appears to be back on the ventilator after TCT this am. Left message on unit for RN to page SLP if placed back on TC today, otherwise will f/u on next date as able.   Maxcine HamLaura Paiewonsky, M.A. CCC-SLP [phone]  Maxcine Hamaiewonsky, Jniya Madara 03/19/2016, 1:41 PM

## 2016-03-19 NOTE — Progress Notes (Signed)
Inpatient Rehabilitation  Following pt's medical course at a distance.  Pt. Transferred back to ICU on 03/17/16 for resp distress/aspiration PNA.  Weldon PickingSusan Fatime [place] PT Inpatient Rehab Admissions Coordinator Cell [phone] Office [phone]

## 2016-03-20 LAB — GLUCOSE, CAPILLARY
GLUCOSE-CAPILLARY: 139 mg/dL — AB (ref 65–99)
GLUCOSE-CAPILLARY: 129 mg/dL — AB (ref 65–99)
GLUCOSE-CAPILLARY: 123 mg/dL — AB (ref 65–99)
Glucose-Capillary: 137 mg/dL — ABNORMAL HIGH (ref 65–99)
Glucose-Capillary: 149 mg/dL — ABNORMAL HIGH (ref 65–99)
Glucose-Capillary: 164 mg/dL — ABNORMAL HIGH (ref 65–99)

## 2016-03-20 LAB — BASIC METABOLIC PANEL
Anion gap: 13 (ref 5–15)
BUN: 42 mg/dL — AB (ref 6–20)
CALCIUM: 8.7 mg/dL — AB (ref 8.9–10.3)
CHLORIDE: 100 mmol/L — AB (ref 101–111)
CO2: 34 mmol/L — AB (ref 22–32)
CREATININE: 0.93 mg/dL (ref 0.44–1.00)
GFR calc Af Amer: >60 mL/min (ref >60)
GFR calc non Af Amer: >60 mL/min (ref >60)
GLUCOSE: 164 mg/dL — AB (ref 65–99)
Potassium: 3.1 mmol/L — ABNORMAL LOW (ref 3.5–5.1)
Sodium: 147 mmol/L — ABNORMAL HIGH (ref 135–145)

## 2016-03-20 LAB — CBC
HCT: 32.8 % — ABNORMAL LOW (ref 36.0–46.0)
Hemoglobin: 9.5 g/dL — ABNORMAL LOW (ref 12.0–15.0)
MCH: 25.5 pg — ABNORMAL LOW (ref 26.0–34.0)
MCHC: 29 g/dL — ABNORMAL LOW (ref 30.0–36.0)
MCV: 87.9 fL (ref 78.0–100.0)
PLATELETS: 338 10*3/uL (ref 150–400)
RBC: 3.73 MIL/uL — ABNORMAL LOW (ref 3.87–5.11)
RDW: 19.5 % — AB (ref 11.5–15.5)
WBC: 7.7 10*3/uL (ref 4.0–10.5)

## 2016-03-20 LAB — CULTURE, RESPIRATORY W GRAM STAIN

## 2016-03-20 LAB — MAGNESIUM: Magnesium: 2.1 mg/dL (ref 1.7–2.4)

## 2016-03-20 LAB — CULTURE, RESPIRATORY

## 2016-03-20 MED ORDER — QUETIAPINE FUMARATE 100 MG PO TABS
100.0000 mg | ORAL_TABLET | Freq: Two times a day (BID) | ORAL | Status: DC
  Administered 2016-03-20 – 2016-03-23 (×6): 100 mg via ORAL
  Filled 2016-03-20 (×7): qty 1

## 2016-03-20 MED ORDER — FENTANYL 25 MCG/HR TD PT72
100.0000 ug | MEDICATED_PATCH | TRANSDERMAL | Status: DC
  Administered 2016-03-23 – 2016-03-26 (×2): 100 ug via TRANSDERMAL
  Filled 2016-03-20 (×3): qty 4

## 2016-03-20 MED ORDER — POTASSIUM CHLORIDE 20 MEQ/15ML (10%) PO SOLN
30.0000 meq | ORAL | Status: AC
  Administered 2016-03-20 (×2): 30 meq
  Filled 2016-03-20 (×2): qty 30

## 2016-03-20 NOTE — Progress Notes (Signed)
Pharmacy Antibiotic Note  Kendra Osborne is a 70 y.o. female admitted on 02/24/2016 with respiratory distress and aspiration pneumonia.  Pharmacy has been consulted for Unasyn dosing.  Plan: Continue Unasyn IV 1.5g q6h F/u cx, de-escalation plans  Height: 5' (152.4 cm) Weight: 207 lb 3.7 oz (94 kg) IBW/kg (Calculated) : 45.5  Temp (24hrs), Avg:98.8 F (37.1 C), Min:98.4 F (36.9 C), Max:99.4 F (37.4 C)   Recent Labs Lab 03/14/16 0415 03/15/16 0421 03/16/16 0500 03/17/16 0521 03/18/16 0403 03/19/16 0437 03/20/16 0350  WBC 8.0 8.5  --  10.3 14.0*  --  7.7  CREATININE 0.78 0.72 0.75 0.90 1.26* 1.04* 0.93    Estimated Creatinine Clearance: 59.3 mL/min (by C-G formula based on Cr of 0.93).    Not on File  Antimicrobials this admission: Unasyn 3/24>>4/3, resumed 4/8 >> Ancef 3/21 - 3/24 Clinda 3/21 (only 1 dose) Azith 3/18 - 3/19 Rocephin 3/18 - 3/20  Microbiology results: Tracheal Asp Ctx 3/25>>> ng Blood Ctx x2 3/18:  Negative   MRSA PCR 3/18:  Negative  Resp cx 4/8 >> normal flora  Thank you for allowing pharmacy to be a part of this patient's care.  [place] PinkGazda, Mackenzie Lia P, PharmD 03/20/2016 7:46 AM

## 2016-03-20 NOTE — Progress Notes (Signed)
[facility] FOR AM LAB REPLACEMENT ONLY  The patient does apply for the [facility]ly[facility] Protocol based on the criteria listed below:   1. Is GFR >/= 40 ml/min? Yes.    Patient's GFR today is >60 2. Is urine output >/= 0.5 ml/kg/hr for the last 6 hours? Yes.   Patient's UOP is 1.0 ml/kg/hr 3. Is BUN < 60 mg/dL? Yes.    Patient's BUN today is 42 4. Abnormal electrolyte(s): K+3.1 5. Ordered repletion with: protocol 6. If a panic level lab has been reported, has the CCM MD in charge been notified? No..   Physician:  Haywood LassoSood  [facility] Wrede [facility] 03/20/2016 6:13 AM

## 2016-03-20 NOTE — Progress Notes (Signed)
PULMONARY / CRITICAL CARE MEDICINE    Name: Kendra Osborne MRN: [id number] DOB: 1946-05-07    ADMISSION DATE:  02/24/2016  PRIMARY SERVICE: PCCM  CHIEF COMPLAINT:  Dyspnea  BRIEF PATIENT DESCRIPTION: 70 y/o woman recently moved from [place] who presents with respiratory distress. She presented to the ED on 3/17 with complaints of dyspnea, and lower extremity edema. History is limited due to language / culture barriers, but she has a PMHx of a known goiter that has been present for years, as well as possibly hypertension. It is not clear if the goiter was ever worked up or if she ever was on regular therapy for her hypertension. Her Nephew and son (who speaks little [place]) report that she been having a cough for a few days as well as some fevers and chills.  SUBJECTIVE:   Has had periods of agitation Was able to tolerate PSV 12 for a period of time. No ATC today   VITAL SIGNS: Temp:  [98.3 F (36.8 C)-99.4 F (37.4 C)] 99 F (37.2 C) (04/11 1522) Pulse Rate:  [65-91] 91 (04/11 1214) Resp:  [11-30] 16 (04/11 1430) BP: (98-187)/(43-143) 138/68 mmHg (04/11 1430) SpO2:  [70 %-100 %] 100 % (04/11 1430) FiO2 (%):  [40 %] 40 % (04/11 1214) Weight:  [94 kg (207 lb 3.7 oz)] 94 kg (207 lb 3.7 oz) (04/11 0354) HEMODYNAMICS:   VENTILATOR SETTINGS: Vent Mode:  [-] PRVC FiO2 (%):  [40 %] 40 % Set Rate:  [16 bmp] 16 bmp Vt Set:  [420 mL] 420 mL PEEP:  [5 cmH20] 5 cmH20 Pressure Support:  [12 cmH20] 12 cmH20 Plateau Pressure:  [14 cmH20-28 cmH20] 14 cmH20  INTAKE / OUTPUT: Intake/Output      04/10 0701 - 04/11 0700 04/11 0701 - 04/12 0700   I.V. (mL/kg) 1235 (13.1) 335 (3.6)   Other 145 145   NG/GT 1059.4 385   IV Piggyback 200 50   Total Intake(mL/kg) 2639.4 (28.1) 915 (9.7)   Urine (mL/kg/hr) 2005 (0.9) 490 (0.6)   Total Output 2005 490   Net +634.4 +425         PHYSICAL EXAMINATION: General: comfortable.  Trach in place.  Obese woman Integument:  Warm & dry. No rash on  exposed skin. Surgical incision on neck clean & dry. HEENT:  Trach in place, poor dentition Cardiovascular:  s1 s2 RRR Pulmonary:  Coarse sounds, diminished. Rhonchi BLF > less Abdomen: Soft. Normal bowel sounds. Nondistended. PEG Neurological: Spontaneously moving all 4 extremities. Eyes open. Appears grossly nonfocal. Occasional agitation   LABS:  CBC  Recent Labs Lab 03/17/16 0521 03/18/16 0403 03/20/16 0350  WBC 10.3 14.0* 7.7  HGB 10.6* 10.1* 9.5*  HCT 36.6 33.1* 32.8*  PLT 334 310 338   Coag's  Recent Labs Lab 03/15/16 0421  INR 1.06   BMET  Recent Labs Lab 03/18/16 0403 03/19/16 0437 03/20/16 0350  NA 144 147* 147*  K 3.3* 3.5 3.1*  CL 95* 100* 100*  CO2 36* 34* 34*  BUN 52* 44* 42*  CREATININE 1.26* 1.04* 0.93  GLUCOSE 125* 137* 164*   Electrolytes  Recent Labs Lab 03/14/16 0415 03/15/16 0421  03/18/16 0403 03/19/16 0437 03/20/16 0350  CALCIUM 8.1* 8.3*  < > 8.7* 8.9 8.7*  MG 2.2 2.0  < > 2.0 2.0 2.1  PHOS 4.6 3.4  --   --   --   --   < > = values in this interval not displayed. Sepsis Markers No results for input(s):  LATICACIDVEN, PROCALCITON, O2SATVEN in the last 168 hours. ABG  Recent Labs Lab 03/17/16 0355 03/17/16 1253  PHART 7.274* 7.388  PCO2ART 91.1* 67.8*  PO2ART 96.9 121.0*   Liver Enzymes  Recent Labs Lab 03/17/16 0521 03/18/16 0403 03/19/16 0437  AST 20 22 23   ALT 11* 11* 10*  ALKPHOS 67 65 64  BILITOT 0.5 0.7 0.7  ALBUMIN 2.3* 2.1* 2.3*   Cardiac Enzymes No results for input(s): TROPONINI, PROBNP in the last 168 hours. Glucose  Recent Labs Lab 03/19/16 1545 03/19/16 2001 03/19/16 2350 03/20/16 0345 03/20/16 0827 03/20/16 1209  GLUCAP 116* 125* 137* 149* 139* 164*   Imaging No results found. SIGNIFICANT EVENTS: 3/17 - Admit 3/21 - Thyroidectomy 3/24 - Extubation & reintubation within 2 hours due to hypoxia & secretions 4/6- 24 hr trach collar 4/7- trach change by ENT to 6 uncuffed proximal  XLT 4/8 - transferred back to icu for resp distress/asp pna. Trache changed to #6 cuffed XLT at bedside.   STUDIES:  CT-A of Chest >> mild mediastinal enlargement CT of the Neck with contrast >> multinodular goiter, meningioma Port CXR 3/22:  ETT & CVL in good position. Blunting bilateral costophrenic angles suggestive of effusions. Bilateral hilar fullness & interstitial prominence with low lung volumes.  Port CXR 3/25:. Silhouetting of the left hemidiaphragm. Platelike atelectasis right lower lung. Endotracheal tube in good position. Ort CXR 4/8: incr bibasilar atelectasis  LINES / TUBES: OETT 7.5 3/17 - 3/24; 7.0 3/24>>>3/31 Trach (ENT) 3/31>>> L Subclavian CVL 3/21>>> L Port 3/21>>> OGT 3/21 - 3/24; 3/24>>> Foley 3/18>>> PIV x1 L Radial Art Line 3/21 - 3/23 R & L Neck Drains 3/21 - ???  MICROBIOLOGY: Tracheal Asp Ctx 3/25>>> ng Blood Ctx x2 3/18:  Negative  MRSA PCR 3/18:  Negative  Trache asp 4/8 > candida   ANTIBIOTICS: Unasyn 3/24>>> Ancef 3/21 - 3/24 Clindamycin 3/21 (only 1 dose) Azithromycin 3/18 - 3/19 Rocephin 3/18 - 3/20  ASSESSMENT / PLAN:  PULMONARY A: Acute on Chronic Hypercarbic Respiratory Failure Acute Hypoxic Respiratory Failure - Question element of pulmonary edema vs viral bronchitis/pneumonitis. Extrinsic Airway Compression - Secondary to goiter. S/P total thyroidectomy 3/21. Recurrent aspiration since change to uncuffed trach 4/7 Pt's trach changed to cuffed trach on 4/7 > on TC trial P:   Not tolerating ATC, tolerating some PSV Advance TF, follow to insure no evidence aspiration ? Whether she is an LTAC candidate for slow wean   CARDIOVASCULAR A: HTN - TTE 3/19 nml LV fn, gr 1 DD.  P:   Hydralazine IV PRN, not needed Lasix prn tele  RENAL A: Hypokalemia - Replacing. Hypernatremia - Mild but stable. Hypomagnesemia - Resolved. Hyponatremia - Resolved. Acute Renal Failure - Resolved.  P:   Follow BMP Replace electrolytes as  indicated  GASTROINTESTINAL A: dyspahgia S/P PEG Concern for asp pna while on cuffless trach P:   TF advance as tolerated.  Protonix daily.  HEMATOLOGIC A: Anemia - Mild.  P:   Limit phlebotomy SCDs. Continue Heparin [place] q8hr.  INFECTIOUS A: Aspiration Pneumonia-treated P:   Cx trach secretions > f/u Monitor for fever & leukocytosis.   ENDOCRINE A: S/P Total Thyroidectomy - Previously normal TSH.  P:   Monitor calcium level w/ daily labs. Synthroid 100mcg VT daily  tsh in 6 weeks  NEUROLOGIC A: Post-Op Pain Anxiety  P:   RASS Goal: 0  Versed IV prn Add back clonazepam, 0.5 bid Seroquel increase to 100 bid Increase fentanyl patch 100 mcg  Fentanyl  prn  ? Whether she could go to [facility] for further weaning.   Levy Pupa, MD, PhD 03/20/2016, 3:40 PM [facility] [phone] or if no answer [phone]

## 2016-03-20 NOTE — Progress Notes (Signed)
SLP Cancellation Note  Patient Details Name: Kendra Osborne MRN: [id number] DOB: 09/04/1946   Cancelled treatment:       Reason Eval/Treat Not Completed: Medical issues which prohibited therapy. RN reports pt copious thick secretions with very limited tolerance of TC. Not appropriate for PMSV today. Will continue to follow.    Rocky CraftsKara E Daisia Slomski  MA, CCC-SLP Pager [phone]  03/20/2016, 9:43 AM

## 2016-03-21 ENCOUNTER — Inpatient Hospital Stay (HOSPITAL_COMMUNITY): Payer: Medicaid Other

## 2016-03-21 LAB — GLUCOSE, CAPILLARY
GLUCOSE-CAPILLARY: 96 mg/dL (ref 65–99)
GLUCOSE-CAPILLARY: 95 mg/dL (ref 65–99)
GLUCOSE-CAPILLARY: 163 mg/dL — AB (ref 65–99)
Glucose-Capillary: 137 mg/dL — ABNORMAL HIGH (ref 65–99)
Glucose-Capillary: 138 mg/dL — ABNORMAL HIGH (ref 65–99)
Glucose-Capillary: 139 mg/dL — ABNORMAL HIGH (ref 65–99)

## 2016-03-21 LAB — BASIC METABOLIC PANEL
Anion gap: 10 (ref 5–15)
BUN: 36 mg/dL — AB (ref 6–20)
CALCIUM: 8.5 mg/dL — AB (ref 8.9–10.3)
CO2: 32 mmol/L (ref 22–32)
Chloride: 109 mmol/L (ref 101–111)
Creatinine, Ser: 0.8 mg/dL (ref 0.44–1.00)
GFR calc Af Amer: >60 mL/min (ref >60)
GLUCOSE: 152 mg/dL — AB (ref 65–99)
Potassium: 3.5 mmol/L (ref 3.5–5.1)
Sodium: 151 mmol/L — ABNORMAL HIGH (ref 135–145)

## 2016-03-21 LAB — MAGNESIUM: MAGNESIUM: 1.9 mg/dL (ref 1.7–2.4)

## 2016-03-21 MED ORDER — DOCUSATE SODIUM 50 MG/5ML PO LIQD
100.0000 mg | Freq: Two times a day (BID) | ORAL | Status: DC
  Administered 2016-03-21 – 2016-04-09 (×35): 100 mg via ORAL
  Filled 2016-03-21 (×43): qty 10

## 2016-03-21 MED ORDER — PANTOPRAZOLE SODIUM 40 MG PO PACK
40.0000 mg | PACK | Freq: Every day | ORAL | Status: DC
  Administered 2016-03-21 – 2016-04-06 (×17): 40 mg
  Filled 2016-03-21 (×16): qty 20

## 2016-03-21 MED ORDER — FREE WATER
300.0000 mL | Status: DC
  Administered 2016-03-21 – 2016-03-28 (×42): 300 mL

## 2016-03-21 MED ORDER — POLYETHYLENE GLYCOL 3350 17 G PO PACK
17.0000 g | PACK | Freq: Every day | ORAL | Status: DC
  Administered 2016-03-21 – 2016-03-28 (×7): 17 g via ORAL
  Filled 2016-03-21 (×9): qty 1

## 2016-03-21 NOTE — Care Management Note (Signed)
Case Management Note  Patient Details  Name: Kendra Osborne MRN: [id number] Date of Birth: 11/02/1946  Subjective/Objective:   Patient is uninsured and is NOT an Ltach candidate.  Have talked with [facility] and they are not taking any charity cases at this time.                  Action/Plan:   Expected Discharge Date:                  Expected Discharge Plan:  IP Rehab Facility  In-House Referral:     Discharge planning Services  CM Consult  Post Acute Care Choice:    Choice offered to:     DME Arranged:    DME Agency:     HH Arranged:    HH Agency:     Status of Service:  In process, will continue to follow  Medicare Important Message Given:    Date Medicare IM Given:    Medicare IM give by:    Date Additional Medicare IM Given:    Additional Medicare Important Message give by:     If discussed at Long Length of Stay Meetings, dates discussed:    Additional Comments:  [name], Kendra Napierkowski Jane, RN 03/21/2016, 3:09 PM

## 2016-03-21 NOTE — Progress Notes (Addendum)
Have continued to follow at a distance.  Note Dr. Delton CoombesByrum is questioning if pt. is an LTACH candidate for slow wean.  Pt. currently back on trach collar.  I will sign off at this time.  Weldon PickingSusan Quinnie Barcelo PT Inpatient Rehab Admissions Coordinator Cell [phone] Office [phone]

## 2016-03-21 NOTE — Progress Notes (Signed)
PULMONARY / CRITICAL CARE MEDICINE    Name: Kendra Osborne MRN: [id number] DOB: 1946-01-19    ADMISSION DATE:  02/24/2016  PRIMARY SERVICE: PCCM  CHIEF COMPLAINT:  Dyspnea  BRIEF PATIENT DESCRIPTION: 70 y/o woman recently moved from [place] who presents with respiratory distress. She presented to the ED on 3/17 with complaints of dyspnea, and lower extremity edema. History is limited due to language / culture barriers, but she has a PMHx of a known goiter that has been present for years, as well as possibly hypertension. It is not clear if the goiter was ever worked up or if she ever was on regular therapy for her hypertension. Her Nephew and son (who speaks little [place]) report that she been having a cough for a few days as well as some fevers and chills.  SUBJECTIVE:   Now on trach collar.   VITAL SIGNS: Temp:  [98.3 F (36.8 C)-99.2 F (37.3 C)] 98.7 F (37.1 C) (04/12 0828) Pulse Rate:  [62-91] 62 (04/12 0500) Resp:  [11-30] 17 (04/12 0800) BP: (104-201)/(41-143) 142/77 mmHg (04/12 0800) SpO2:  [70 %-100 %] 100 % (04/12 0800) FiO2 (%):  [40 %] 40 % (04/12 0600) Weight:  [207 lb 0.2 oz (93.9 kg)] 207 lb 0.2 oz (93.9 kg) (04/12 0309) HEMODYNAMICS:   VENTILATOR SETTINGS: Vent Mode:  [-] PRVC FiO2 (%):  [40 %] 40 % Set Rate:  [16 bmp] 16 bmp Vt Set:  [420 mL] 420 mL PEEP:  [5 cmH20] 5 cmH20 Pressure Support:  [12 cmH20] 12 cmH20 Plateau Pressure:  [14 cmH20-19 cmH20] 18 cmH20  INTAKE / OUTPUT: Intake/Output      04/11 0701 - 04/12 0700 04/12 0701 - 04/13 0700   I.V. (mL/kg) 1160 (12.4)    Other 295    NG/GT 1265    IV Piggyback 200    Total Intake(mL/kg) 2920 (31.1)    Urine (mL/kg/hr) 2090 (0.9)    Total Output 2090     Net +830           PHYSICAL EXAMINATION: General: comfortable.  Trach in place.  Obese woman Integument:  Warm & dry. No rash on exposed skin. Surgical incision on neck clean & dry. HEENT:  Trach in place, poor dentition Cardiovascular:  s1 s2  RRR Pulmonary:  Coarse sounds, diminished. Rhonchi BLF > less Abdomen: Soft. Normal bowel sounds. Nondistended. PEG Neurological: Spontaneously moving all 4 extremities. Eyes open. Appears grossly nonfocal. Occasional agitation   LABS:  CBC  Recent Labs Lab 03/17/16 0521 03/18/16 0403 03/20/16 0350  WBC 10.3 14.0* 7.7  HGB 10.6* 10.1* 9.5*  HCT 36.6 33.1* 32.8*  PLT 334 310 338   Coag's  Recent Labs Lab 03/15/16 0421  INR 1.06   BMET  Recent Labs Lab 03/19/16 0437 03/20/16 0350 03/21/16 0330  NA 147* 147* 151*  K 3.5 3.1* 3.5  CL 100* 100* 109  CO2 34* 34* 32  BUN 44* 42* 36*  CREATININE 1.04* 0.93 0.80  GLUCOSE 137* 164* 152*   Electrolytes  Recent Labs Lab 03/15/16 0421  03/19/16 0437 03/20/16 0350 03/21/16 0330  CALCIUM 8.3*  < > 8.9 8.7* 8.5*  MG 2.0  < > 2.0 2.1 1.9  PHOS 3.4  --   --   --   --   < > = values in this interval not displayed. Sepsis Markers No results for input(s): LATICACIDVEN, PROCALCITON, O2SATVEN in the last 168 hours. ABG  Recent Labs Lab 03/17/16 0355 03/17/16 1253  PHART 7.274* 7.388  PCO2ART 91.1* 67.8*  PO2ART 96.9 121.0*   Liver Enzymes  Recent Labs Lab 03/17/16 0521 03/18/16 0403 03/19/16 0437  AST 20 22 23   ALT 11* 11* 10*  ALKPHOS 67 65 64  BILITOT 0.5 0.7 0.7  ALBUMIN 2.3* 2.1* 2.3*   Cardiac Enzymes No results for input(s): TROPONINI, PROBNP in the last 168 hours. Glucose  Recent Labs Lab 03/20/16 0827 03/20/16 1209 03/20/16 1521 03/20/16 2014 03/20/16 2351 03/21/16 0416  GLUCAP 139* 164* 129* 123* 139* 138*   Imaging Dg Chest Port 1 View  03/21/2016  CLINICAL DATA:  Respiratory failure. EXAM: PORTABLE CHEST 1 VIEW COMPARISON:  03/18/2016. FINDINGS: Tracheostomy tube and right PICC line stable position. Stable mild mediastinal fullness. This possibly due from prominent great vessels. Heart size normal. Persistent low lung volumes with basilar atelectasis and/or infiltrates. Persistent  elevation right hemidiaphragm. No prominent pleural effusion or pneumothorax. IMPRESSION: 1. Lines and tubes in stable position. 2. Versus persistent bibasilar atelectasis and/or infiltrates with elevation the right hemidiaphragm. Electronically Signed   By: Maisie Fushomas  Register   On: 03/21/2016 07:37   SIGNIFICANT EVENTS: 3/17 - Admit 3/21 - Thyroidectomy 3/24 - Extubation & reintubation within 2 hours due to hypoxia & secretions 4/6- 24 hr trach collar 4/7- trach change by ENT to 6 uncuffed proximal XLT 4/8 - transferred back to icu for resp distress/asp pna. Trache changed to #6 cuffed XLT at bedside.  4/12 trach collar  STUDIES:  CT-A of Chest >> mild mediastinal enlargement CT of the Neck with contrast >> multinodular goiter, meningioma Port CXR 3/22:  ETT & CVL in good position. Blunting bilateral costophrenic angles suggestive of effusions. Bilateral hilar fullness & interstitial prominence with low lung volumes.  Port CXR 3/25:. Silhouetting of the left hemidiaphragm. Platelike atelectasis right lower lung. Endotracheal tube in good position. Ort CXR 4/8: incr bibasilar atelectasis 4/12 pCXR: persistent elevation of right hemidiaphragm, bibasilar atelectasis vs infltrate  LINES / TUBES: OETT 7.5 3/17 - 3/24; 7.0 3/24>>>3/31 Trach (ENT) 3/31>>> L Subclavian CVL 3/21>>> L Port 3/21>>> OGT 3/21 - 3/24; 3/24>>> Foley 3/18>>> PIV x1 L Radial Art Line 3/21 - 3/23 R & L Neck Drains 3/21 - ???  MICROBIOLOGY: Tracheal Asp Ctx 3/25>>> ng Blood Ctx x2 3/18:  Negative  MRSA PCR 3/18:  Negative  Trache asp 4/8 > candida   ANTIBIOTICS: Unasyn 3/24>>> Ancef 3/21 - 3/24 Clindamycin 3/21 (only 1 dose) Azithromycin 3/18 - 3/19 Rocephin 3/18 - 3/20  ASSESSMENT / PLAN:  PULMONARY A: Acute on Chronic Hypercarbic Respiratory Failure Acute Hypoxic Respiratory Failure - Question element of pulmonary edema vs viral bronchitis/pneumonitis. Extrinsic Airway Compression - Secondary to  goiter. S/P total thyroidectomy 3/21. Recurrent aspiration since change to uncuffed trach 4/7 Pt's trach changed to cuffed trach on 4/7 > on TC trial Elevated Right Hemidiaphragm  P:   Advance TF, follow to insure no evidence aspiration Now on trach collar Stable for transfer to Step-down under PCCM care   CARDIOVASCULAR A: HTN - TTE 3/19 nml LV fn, gr 1 DD.  P:   Hydralazine IV PRN, not needed Lasix prn   RENAL A: Hypokalemia - Replacing. Hypernatremia - Increasing Hypomagnesemia - Resolved. Hyponatremia - Resolved. Acute Renal Failure - Resolved.  P:   Follow BMP Replace electrolytes as indicated Estimated free water deficit of 3.3 L>>start free water 300ml Q4  GASTROINTESTINAL A: dyspahgia S/P PEG Concern for asp pna while on cuffless trach Constipation due to narcotics P:   TF advance as tolerated.  Protonix  daily. Docusate  BID Miralax scheduled daily  HEMATOLOGIC A: Anemia - Mild.  P:   Limit phlebotomy SCDs. Continue Heparin [place] q8hr.  INFECTIOUS A: Aspiration Pneumonia- P:   On Unasyn, consider stop date of 4/18 (10 days after Aspiration/ decompensation event on 4/8) Monitor for fever & leukocytosis.   ENDOCRINE A: S/P Total Thyroidectomy - Previously normal TSH. P:   Monitor calcium level w/ daily labs. Synthroid VT daily  tsh in 6 weeks  NEUROLOGIC A: Post-Op Pain Anxiety Concern for right sided bias P:   RASS Goal: 0  Versed IV prn clonazepam, 0.5 bid Seroquel 100 bid  fentanyl patch 100 mcg  Fentanyl prn No focal neurologic deficit. Consider CT head to eval for stroke if neurologic deficit better appreciated with decreased sedation   Gust Rung, DO  [facility] PGY-3  03/21/2016, 8:35 AM [facility] [phone] or if no answer [phone]

## 2016-03-21 NOTE — Progress Notes (Signed)
[name] Speaking Valve - Treatment Patient Details  Name: Kendra Osborne MRN: [id number] Date of Birth: 02/12/1946  Today's Date: 03/21/2016 Time: 1032-1056 SLP Time Calculation (min) (ACUTE ONLY): 24 min  Past Medical History:  Past Medical History  Diagnosis Date  . Hypertension   . Chronic diastolic CHF (congestive heart failure) [facility](HCC)    Past Surgical History:  Past Surgical History  Procedure Laterality Date  . Thyroidectomy N/A 02/28/2016    Procedure:  TOTAL THYROIDECTOMY WITH NIMS MONITOR;  Surgeon: Melvenia BeamMitchell Gore, MD;  [facility];  Service: ENT;  Laterality: N/A;  . Tracheostomy tube placement N/A 03/09/2016    Procedure: TRACHEOSTOMY;  Surgeon: Melvenia BeamMitchell Gore, MD;  [facility];  Service: ENT;  Laterality: N/A;    Assessment / Plan / Recommendation Clinical Impression    True phonation demonstrated x1 with mod verbal and visual cues provided by SLP. Suspect all other utterances to be due to false vocal fold adduction, with pt speaking with residual air in lungs rather than during exhalation. SpO2 and HR remained WFL, however RR briefly spiked above 40 at which time valve was removed. Despite this, pt appears to tolerate PMSV much better than last week when previously seen. Pt coughed frequently throughout session to bring secretions to tracheal and/or oral level, yet unable to completely clear. RN notified to provide deep suctioning. Pt and son educated re: benefits of PMSV and PMSV use/precautions. Will continue to follow, for now PMSV to be used with SLP only.    Plan    PMSV with SLP only, full supervision   Follow Up Recommendations  Inpatient Rehab        SLP Goals Potential to Achieve Goals (ACUTE ONLY): Fair Potential Considerations (ACUTE ONLY): Ability to learn/carryover information;Medical prognosis;Severity of impairments   PMSV Trial  PMSV was placed for: total of 10 minutes Able to redirect subglottic air through upper airway: Yes Able to Attain  Phonation: Yes (true phonation x1, mostly false vocal fold adduction) Voice Quality: Hoarse;Low vocal intensity Able to Expectorate Secretions: Yes Level of Secretion Expectoration with PMSV: Oral Breath Support for Phonation: Moderately decreased Intelligibility: Intelligibility reduced Word: 0-24% accurate Phrase: Not tested Sentence: Not tested Conversation: Not tested Respirations During Trial:  (13-41) SpO2 During Trial:  (97-100) Pulse During Trial:  (80-89) Behavior: Alert;Confused;Anxious;Cooperative   Tracheostomy Tube       Vent Dependency  FiO2 (%): 40 %    Cuff Deflation Trial  GO     Tolerated Cuff Deflation: Yes Length of Time for Cuff Deflation Trial: 20 Behavior: Alert;Cooperative;Anxious   Lynita LombardLauren Taryne Kiger 03/21/2016, 12:23 PM   Lynita LombardLauren Tannie Koskela, Student-SLP

## 2016-03-21 NOTE — Progress Notes (Signed)
Placed pt back on vent for night time rest.  Added 24 air to cuff and placed patient back on setting previously.

## 2016-03-21 NOTE — Progress Notes (Signed)
Occupational Therapy Treatment Patient Details Name: Kendra Osborne MRN: [id number] DOB: [birth year] Today's Date: 03/21/2016    History of present illness pt presents with Large thyroid mass/goiter and Respiratory Difficulties.  pt has underwent Total Thyroidectomy with goiter removal and has remained intubated with one failed extubation on 3/24.  pt recently came to the [place] from [place] and only known medical hx is HTN and Goiter.     OT comments  Pt more alert and interactive today.  SHe required less assist to maintain sitting, but continues with Rt lean/bias (MD made aware).     Follow Up Recommendations  CIR;Supervision/Assistance - 24 hour    Equipment Recommendations  3 in 1 bedside comode    Recommendations for Other Services      Precautions / Restrictions Precautions Precautions: Fall Precaution Comments: Trach Collar 40% Restrictions Weight Bearing Restrictions: No       Mobility Bed Mobility Overal bed mobility: Needs Assistance;+2 for physical assistance Bed Mobility: Supine to Sit;Sit to Supine     Supine to sit: Mod assist;+2 for physical assistance;HOB elevated Sit to supine: Mod assist;+2 for physical assistance   General bed mobility comments: pt brought to long sitting in bed with HOB elevated.  pt with strong R lateral lean needing MinG to ModA to maintain sitting balance.  pt able to long sit x15 mins attempting to wash face and move UEs.    Transfers                 General transfer comment: unable to safely attempt     Balance Overall balance assessment: Needs assistance Sitting-balance support: No upper extremity supported Sitting balance-Leahy Scale: Poor Sitting balance - Comments: pt with R lateral lean in sitting needing A to maintain balance.  pt does tend to lean anteriorly on body habitus.  pt with coughing and grimacing while sitting.   Postural control: Right lateral lean                         ADL Overall ADL's :  Needs assistance/impaired Eating/Feeding: NPO   Grooming: Wash/dry face;Moderate assistance;Sitting Grooming Details (indicate cue type and reason): Pt requires support at elbow to access face                                       Vision                     Perception     Praxis      Cognition   Behavior During Therapy: Flat affect Overall Cognitive Status: Difficult to assess                       Extremity/Trunk Assessment               Exercises     Shoulder Instructions       General Comments      Pertinent Vitals/ Pain       Pain Assessment: Faces Faces Pain Scale: Hurts even more Pain Location: Pt grimacing throughout session.  She points to her mouth and to her chest/abdomen area  Pain Descriptors / Indicators: Grimacing Pain Intervention(s): Monitored during session  Home Living  Prior Functioning/Environment              Frequency Min 2X/week     Progress Toward Goals  OT Goals(current goals can now be found in the care plan section)  Progress towards OT goals: Progressing toward goals (slowly - Date for goal update extended )  Acute Rehab OT Goals Patient Stated Goal: Unable to state. OT Goal Formulation: Patient unable to participate in goal setting Time For Goal Achievement: 04/04/16 Potential to Achieve Goals: Good ADL Goals Pt Will Perform Grooming: with min assist;standing Pt Will Perform Upper Body Bathing: with supervision;sitting Pt Will Perform Lower Body Bathing: with min assist;sit to/from stand Pt Will Perform Upper Body Dressing: with min assist;sitting Pt Will Perform Lower Body Dressing: with min assist;sit to/from stand Pt Will Transfer to Toilet: with min assist;ambulating;regular height toilet;bedside commode;grab bars Pt Will Perform Toileting - Clothing Manipulation and hygiene: with min assist;sit to/from stand Pt/caregiver  will Perform Home Exercise Program: Increased strength;Both right and left upper extremity;With theraband;With Supervision;With written HEP provided  Plan Discharge plan remains appropriate    Co-evaluation    PT/OT/SLP Co-Evaluation/Treatment: Yes Reason for Co-Treatment: For patient/therapist safety;Complexity of the patient's impairments (multi-system involvement) PT goals addressed during session: Mobility/safety with mobility;Balance OT goals addressed during session: Strengthening/ROM      End of Session Equipment Utilized During Treatment: Oxygen   Activity Tolerance Patient tolerated treatment well   Patient Left in bed;with call bell/phone within reach   Nurse Communication Mobility status (MD noted of Rt bias/lean )        Time: [id number] OT Time Calculation (min): 26 min  Charges: OT General Charges $OT Visit: 1 Procedure OT Treatments $Therapeutic Activity: 8-22 mins  Jlee Harkless M 03/21/2016, 12:42 PM

## 2016-03-21 NOTE — Progress Notes (Signed)
Physical Therapy Treatment Patient Details Name: Kendra Osborne MRN: [id number] DOB: 12/19/1945 Today's Date: 03/21/2016    History of Present Illness pt presents with Large thyroid mass/goiter and Respiratory Difficulties.  pt has underwent Total Thyroidectomy with goiter removal and has remained intubated with one failed extubation on 3/24.  [name] and only known medical hx is HTN and Goiter.      PT Comments    Pt suctioned by RN just prior to session, though does cough and have difficulty managing secretions throughout session.  Pt's BP elevated 158 systolic at beginning of session and up to 176 systolic by end of session with RN aware of BPs.  Pt on Trach Collar 40% with sats remaining in low 90's throughout session.  Pt continues to have a R sided lean and today does attempt to correct lean, but needs A to fully correct.  This lean was not present at eval or during the PT session earlier in pt's admit.  MD made aware.  Pt grimaces throughout session and appears clearly upset.  Will continue to follow.    Follow Up Recommendations  CIR     Equipment Recommendations  None recommended by PT    Recommendations for Other Services       Precautions / Restrictions Precautions Precautions: Fall Precaution Comments: Trach Collar 40% Restrictions Weight Bearing Restrictions: No    Mobility  Bed Mobility Overal bed mobility: Needs Assistance;+2 for physical assistance Bed Mobility: Supine to Sit;Sit to Supine     Supine to sit: Mod assist;+2 for physical assistance;HOB elevated Sit to supine: Mod assist;+2 for physical assistance   General bed mobility comments: pt brought to long sitting in bed with HOB elevated.  pt with strong R lateral lean needing MinG to ModA to maintain sitting balance.  pt able to long sit x15 mins attempting to wash face and move UEs.    Transfers                    Ambulation/Gait                  Stairs            Wheelchair Mobility    Modified Rankin (Stroke Patients Only)       Balance Overall balance assessment: Needs assistance Sitting-balance support: No upper extremity supported;Feet supported Sitting balance-Leahy Scale: Poor Sitting balance - Comments: pt with R lateral lean in sitting needing A to maintain balance.  pt does tend to lean anteriorly on body habitus.  pt with coughing and grimacing while sitting.                              Cognition Arousal/Alertness: Awake/alert Behavior During Therapy: Flat affect Overall Cognitive Status: Difficult to assess                      Exercises      General Comments        Pertinent Vitals/Pain Pain Assessment: Faces Faces Pain Scale: Hurts even more Pain Location: pt grimaces multiple times throughout session, but not always related to mobility.  Unsure if some could be related to frustration.   Pain Descriptors / Indicators: Grimacing Pain Intervention(s): Monitored during session;Repositioned    Home Living                      Prior  Function            PT Goals (current goals can now be found in the care plan section) Acute Rehab PT Goals Patient Stated Goal: Unable to state. PT Goal Formulation: Patient unable to participate in goal setting Time For Goal Achievement: 04/02/16 Potential to Achieve Goals: Good Progress towards PT goals: Progressing toward goals    Frequency  Min 3X/week    PT Plan Current plan remains appropriate    Co-evaluation PT/OT/SLP Co-Evaluation/Treatment: Yes Reason for Co-Treatment: For patient/therapist safety;Necessary to address cognition/behavior during functional activity;Complexity of the patient's impairments (multi-system involvement) PT goals addressed during session: Mobility/safety with mobility;Balance       End of Session Equipment Utilized During Treatment: Oxygen Activity Tolerance: Patient limited by  fatigue Patient left: in bed;with call bell/phone within reach;with bed alarm set;with restraints reapplied     Time: 1130-1157 PT Time Calculation (min) (ACUTE ONLY): 27 min  Charges:  $Therapeutic Activity: 8-22 mins                    G CodesSunny Schlein, [place] [phone] 03/21/2016, 12:14 PM

## 2016-03-22 LAB — GLUCOSE, CAPILLARY
GLUCOSE-CAPILLARY: 131 mg/dL — AB (ref 65–99)
GLUCOSE-CAPILLARY: 149 mg/dL — AB (ref 65–99)
Glucose-Capillary: 147 mg/dL — ABNORMAL HIGH (ref 65–99)
Glucose-Capillary: 110 mg/dL — ABNORMAL HIGH (ref 65–99)
Glucose-Capillary: 121 mg/dL — ABNORMAL HIGH (ref 65–99)
Glucose-Capillary: 134 mg/dL — ABNORMAL HIGH (ref 65–99)

## 2016-03-22 LAB — BASIC METABOLIC PANEL
ANION GAP: 14 (ref 5–15)
BUN: 35 mg/dL — AB (ref 6–20)
CALCIUM: 8.4 mg/dL — AB (ref 8.9–10.3)
CO2: 29 mmol/L (ref 22–32)
Chloride: 104 mmol/L (ref 101–111)
Creatinine, Ser: 0.74 mg/dL (ref 0.44–1.00)
GFR calc Af Amer: >60 mL/min (ref >60)
GLUCOSE: 173 mg/dL — AB (ref 65–99)
Potassium: 3.8 mmol/L (ref 3.5–5.1)
SODIUM: 147 mmol/L — AB (ref 135–145)

## 2016-03-22 LAB — MAGNESIUM: Magnesium: 1.9 mg/dL (ref 1.7–2.4)

## 2016-03-22 MED ORDER — WHITE PETROLATUM GEL
Status: AC
  Administered 2016-03-22: 0.2
  Filled 2016-03-22: qty 1

## 2016-03-22 NOTE — Progress Notes (Signed)
OT Cancellation Note  Patient Details Name: Juliane Pootaseem Peart MRN: [id number] DOB: 01/07/1946   Cancelled Treatment:    Reason Eval/Treat Not Completed: Pt just finished with SLP adn agitated per RN, meds given to sedate her and reduce agitation.  Will reattempt   Angelene GiovanniConarpe, Audriella Blakeley M  Rithwik Schmieg [place]arpe, OTR/L [phone]  03/22/2016, 4:13 PM

## 2016-03-22 NOTE — Progress Notes (Signed)
Cuff deflated, tolerating well at this time, RN aware.

## 2016-03-22 NOTE — Progress Notes (Signed)
[name] Speaking Valve - Treatment Patient Details  Name: Kendra Osborne MRN: [id number] Date of Birth: 08/04/1946  Today's Date: 03/22/2016 Time: [id number] SLP Time Calculation (min) (ACUTE ONLY): 25 min  Past Medical History:  Past Medical History  Diagnosis Date  . Hypertension   . Chronic diastolic CHF (congestive heart failure) [facility](HCC)    Past Surgical History:  Past Surgical History  Procedure Laterality Date  . Thyroidectomy N/A 02/28/2016    Procedure:  TOTAL THYROIDECTOMY WITH NIMS MONITOR;  Surgeon: Melvenia BeamMitchell Gore, MD;  [facility];  Service: ENT;  Laterality: N/A;  . Tracheostomy tube placement N/A 03/09/2016    Procedure: TRACHEOSTOMY;  Surgeon: Melvenia BeamMitchell Gore, MD;  [facility];  Service: ENT;  Laterality: N/A;    Assessment / Plan / Recommendation Clinical Impression    Pt alert and anxious throughout session, and began grabbing at lines and SLP when restraints were removed. Pt tolerated cuff deflation, yet would not allow PMSV to be placed. Per RN, pt has appeared agitated since meds given this pm. Will continue to follow. Recommend PMSV placement with SLP only with full supervision.    Plan    PMSV with speech only, full supervision   Follow Up Recommendations  Inpatient Rehab        SLP Goals Potential to Achieve Goals (ACUTE ONLY): Fair Potential Considerations (ACUTE ONLY): Ability to learn/carryover information;Medical prognosis;Severity of impairments   PMSV Trial  PMSV was placed for: not placed Behavior: Alert;Anxious;Angry;Good eye contact   Tracheostomy Tube       Vent Dependency  FiO2 (%): 40 %    Cuff Deflation Trial  GO     Tolerated Cuff Deflation: Yes Length of Time for Cuff Deflation Trial: 25 Behavior: Alert;Anxious;Good eye contact;Tense   Lynita LombardLauren Elizjah Noblet 03/22/2016, 3:13 PM   Lynita LombardLauren Lavonte Palos, Student-SLP

## 2016-03-22 NOTE — Progress Notes (Signed)
PULMONARY / CRITICAL CARE MEDICINE    Name: Kendra Osborne MRN: [id number] DOB: 1946/05/15    ADMISSION DATE:  02/24/2016  PRIMARY SERVICE: PCCM  CHIEF COMPLAINT:  Dyspnea  BRIEF PATIENT DESCRIPTION: 70 y/o woman recently moved from [place] who presents with respiratory distress. She presented to the ED on 3/17 with complaints of dyspnea, and lower extremity edema. History is limited due to language / culture barriers, but she has a PMHx of a known goiter that has been present for years, as well as possibly hypertension. It is not clear if the goiter was ever worked up or if she ever was on regular therapy for her hypertension. Her Nephew and son (who speaks little [place]) report that she been having a cough for a few days as well as some fevers and chills.  SUBJECTIVE:   Now on trach collar.   VITAL SIGNS: Temp:  [97.5 F (36.4 C)-98.9 F (37.2 C)] 97.5 F (36.4 C) (04/13 1220) Pulse Rate:  [65-96] 78 (04/13 1200) Resp:  [14-33] 27 (04/13 1400) BP: (118-186)/(43-130) 131/89 mmHg (04/13 1400) SpO2:  [93 %-100 %] 98 % (04/13 1400) FiO2 (%):  [40 %] 40 % (04/13 1400) Weight:  [206 lb 12.7 oz (93.8 kg)] 206 lb 12.7 oz (93.8 kg) (04/13 0411) HEMODYNAMICS:   VENTILATOR SETTINGS: Vent Mode:  [-] PRVC FiO2 (%):  [40 %] 40 % Set Rate:  [16 bmp] 16 bmp Vt Set:  [420 mL] 420 mL PEEP:  [5 cmH20] 5 cmH20 Plateau Pressure:  [11 cmH20-19 cmH20] 11 cmH20  INTAKE / OUTPUT: Intake/Output      04/12 0701 - 04/13 0700 04/13 0701 - 04/14 0700   I.V. (mL/kg) 1200 (12.8) 242.7 (2.6)   Other 600 60   NG/GT 1975 985   IV Piggyback 150 50   Total Intake(mL/kg) 3925 (41.8) 1337.7 (14.3)   Urine (mL/kg/hr) 1870 (0.8) 485 (0.7)   Total Output 1870 485   Net +2055 +852.7         PHYSICAL EXAMINATION: General: comfortable.  Trach in place on tach collar.  Obese woman Integument:  Warm & dry. No rash on exposed skin. Surgical incision on neck clean & dry. HEENT:  Trach in place, poor  dentition Cardiovascular:  s1 s2 RRR Pulmonary:  Some coarse breath sounds in lower lobes Abdomen: Soft. Normal bowel sounds. Nondistended. PEG Neurological: Spontaneously moving all 4 extremities. Eyes open. Appears grossly nonfocal. Less agitated today LABS:  CBC  Recent Labs Lab 03/17/16 0521 03/18/16 0403 03/20/16 0350  WBC 10.3 14.0* 7.7  HGB 10.6* 10.1* 9.5*  HCT 36.6 33.1* 32.8*  PLT 334 310 338   Coag's No results for input(s): APTT, INR in the last 168 hours. BMET  Recent Labs Lab 03/20/16 0350 03/21/16 0330 03/22/16 0845  NA 147* 151* 147*  K 3.1* 3.5 3.8  CL 100* 109 104  CO2 34* 32 29  BUN 42* 36* 35*  CREATININE 0.93 0.80 0.74  GLUCOSE 164* 152* 173*   Electrolytes  Recent Labs Lab 03/20/16 0350 03/21/16 0330 03/22/16 0257 03/22/16 0845  CALCIUM 8.7* 8.5*  --  8.4*  MG 2.1 1.9 1.9  --    Sepsis Markers No results for input(s): LATICACIDVEN, PROCALCITON, O2SATVEN in the last 168 hours. ABG  Recent Labs Lab 03/17/16 0355 03/17/16 1253  PHART 7.274* 7.388  PCO2ART 91.1* 67.8*  PO2ART 96.9 121.0*   Liver Enzymes  Recent Labs Lab 03/17/16 0521 03/18/16 0403 03/19/16 0437  AST ALT 11*  11* 10*  ALKPHOS 67 65 64  BILITOT 0.5 0.7 0.7  ALBUMIN 2.3* 2.1* 2.3*   Cardiac Enzymes No results for input(s): TROPONINI, PROBNP in the last 168 hours. Glucose  Recent Labs Lab 03/21/16 1516 03/21/16 2011 03/21/16 2353 03/22/16 0401 03/22/16 0814 03/22/16 1128  GLUCAP 95 163* 131* 149* 147* 110*   Imaging Dg Chest Port 1 View  03/21/2016  CLINICAL DATA:  Respiratory failure. EXAM: PORTABLE CHEST 1 VIEW COMPARISON:  03/18/2016. FINDINGS: Tracheostomy tube and right PICC line stable position. Stable mild mediastinal fullness. This possibly due from prominent great vessels. Heart size normal. Persistent low lung volumes with basilar atelectasis and/or infiltrates. Persistent elevation right hemidiaphragm. No prominent pleural  effusion or pneumothorax. IMPRESSION: 1. Lines and tubes in stable position. 2. Versus persistent bibasilar atelectasis and/or infiltrates with elevation the right hemidiaphragm. Electronically Signed   By: Maisie Fus  Register   On: 03/21/2016 07:37   SIGNIFICANT EVENTS: 3/17 - Admit 3/21 - Thyroidectomy 3/24 - Extubation & reintubation within 2 hours due to hypoxia & secretions 4/6- 24 hr trach collar 4/7- trach change by ENT to 6 uncuffed proximal XLT 4/8 - transferred back to icu for resp distress/asp pna. Trache changed to #6 cuffed XLT at bedside.  4/12 trach collar  STUDIES:  CT-A of Chest >> mild mediastinal enlargement CT of the Neck with contrast >> multinodular goiter, meningioma Port CXR 3/22:  ETT & CVL in good position. Blunting bilateral costophrenic angles suggestive of effusions. Bilateral hilar fullness & interstitial prominence with low lung volumes.  Port CXR 3/25:. Silhouetting of the left hemidiaphragm. Platelike atelectasis right lower lung. Endotracheal tube in good position. Ort CXR 4/8: incr bibasilar atelectasis 4/12 pCXR: persistent elevation of right hemidiaphragm, bibasilar atelectasis vs infltrate  LINES / TUBES: OETT 7.5 3/17 - 3/24; 7.0 3/24>>>3/31 Trach (ENT) 3/31>>> L Subclavian CVL 3/21>>> L Port 3/21>>> OGT 3/21 - 3/24; 3/24>>> Foley 3/18>>> PIV x1 L Radial Art Line 3/21 - 3/23 R & L Neck Drains 3/21 - ???  MICROBIOLOGY: Tracheal Asp Ctx 3/25>>> ng Blood Ctx x2 3/18:  Negative  MRSA PCR 3/18:  Negative  Trache asp 4/8 > candida   ANTIBIOTICS: Unasyn 3/24>>> Ancef 3/21 - 3/24 Clindamycin 3/21 (only 1 dose) Azithromycin 3/18 - 3/19 Rocephin 3/18 - 3/20  ASSESSMENT / PLAN:  PULMONARY A: Acute on Chronic Hypercarbic Respiratory Failure Acute Hypoxic Respiratory Failure - Aspiration event with concern for aspiration PNA Extrinsic Airway Compression - Secondary to goiter. S/P total thyroidectomy 3/21. Recurrent aspiration since change to  uncuffed trach 4/7 Pt's trach changed to cuffed trach on 4/7 > on TC trial Elevated Right Hemidiaphragm  P:   Advance TF, SLP following Continue cuffed trach for now Now on trach collar Stable for transfer to Step-down under triad hospitalist care, spoke with Dr Joseph Art today to take over care on 4/14.  PCCM will follow as consultant Abx as below   CARDIOVASCULAR A: HTN - TTE 3/19 nml LV fn, gr 1 DD.  P:   Hydralazine IV PRN, not needed Lasix prn   RENAL A: Hypokalemia - resolved Hypernatremia - improving Hypomagnesemia - Resolved. Hyponatremia - Resolved. Acute Renal Failure - Resolved.  P:   Follow BMP Replace electrolytes as indicated Continue free water Q4   GASTROINTESTINAL A: dyspahgia S/P PEG Concern for asp pna while on cuffless trach Constipation due to narcotics P:   TF advance as tolerated.  Protonix daily. Docusate  BID Miralax scheduled daily  HEMATOLOGIC A: Anemia - Mild.  P:   Limit phlebotomy SCDs. Continue Heparin Holden q8hr.  INFECTIOUS A: Aspiration Pneumonia- P:   On Unasyn, consider stop date of 4/18 (10 days after Aspiration/ decompensation event on 4/8) Monitor for fever & leukocytosis.   ENDOCRINE A: S/P Total Thyroidectomy - Previously normal TSH. P:   Monitor calcium level w/ daily labs. Synthroid 100mcg IV daily  tsh in 6 weeks  NEUROLOGIC A: Post-Op Pain Anxiety imporved Concern for right sided bias P:   D/C Versed IV prn upon transfer  clonazepam, 0.5 bid Seroquel 100 bid  fentanyl patch 100 mcg  D/C Fentanyl prn upon transfer Still no focal neurologic deficit. Consider CT head to eval for stroke if neurologic deficit better appreciated with decreased sedation. Continue PT/OT care  Family: Updated at bedside 4/13.  Gust RungErik C Khali [place]ese, DO  [facility] PGY-3  03/22/2016, 2:40 PM [facility] [phone] or if no answer [phone]

## 2016-03-22 NOTE — Progress Notes (Signed)
Patient was transferred from ICU to SD unit. [facility] Charity fundraiserN. Patient is slightly agitated with elevated BP. Hydralazine 10mg  IV was given to get BP withinin normal range. Family is currently at the bedside and updated about patient's current status.

## 2016-03-23 LAB — BASIC METABOLIC PANEL
Anion gap: 10 (ref 5–15)
BUN: 32 mg/dL — AB (ref 6–20)
CALCIUM: 8.4 mg/dL — AB (ref 8.9–10.3)
CO2: 35 mmol/L — AB (ref 22–32)
CREATININE: 0.79 mg/dL (ref 0.44–1.00)
Chloride: 100 mmol/L — ABNORMAL LOW (ref 101–111)
GFR calc non Af Amer: >60 mL/min (ref >60)
Glucose, Bld: 136 mg/dL — ABNORMAL HIGH (ref 65–99)
Potassium: 3.5 mmol/L (ref 3.5–5.1)
SODIUM: 145 mmol/L (ref 135–145)

## 2016-03-23 LAB — CBC
HCT: 33.7 % — ABNORMAL LOW (ref 36.0–46.0)
Hemoglobin: 9.6 g/dL — ABNORMAL LOW (ref 12.0–15.0)
MCH: 25.4 pg — AB (ref 26.0–34.0)
MCHC: 28.5 g/dL — ABNORMAL LOW (ref 30.0–36.0)
MCV: 89.2 fL (ref 78.0–100.0)
PLATELETS: 314 10*3/uL (ref 150–400)
RBC: 3.78 MIL/uL — AB (ref 3.87–5.11)
RDW: 19.3 % — AB (ref 11.5–15.5)
WBC: 8.7 10*3/uL (ref 4.0–10.5)

## 2016-03-23 LAB — GLUCOSE, CAPILLARY
GLUCOSE-CAPILLARY: 121 mg/dL — AB (ref 65–99)
GLUCOSE-CAPILLARY: 122 mg/dL — AB (ref 65–99)
GLUCOSE-CAPILLARY: 124 mg/dL — AB (ref 65–99)
GLUCOSE-CAPILLARY: 128 mg/dL — AB (ref 65–99)
Glucose-Capillary: 118 mg/dL — ABNORMAL HIGH (ref 65–99)
Glucose-Capillary: 124 mg/dL — ABNORMAL HIGH (ref 65–99)

## 2016-03-23 LAB — MAGNESIUM: Magnesium: 1.8 mg/dL (ref 1.7–2.4)

## 2016-03-23 MED ORDER — LORAZEPAM 2 MG/ML IJ SOLN
1.0000 mg | Freq: Once | INTRAMUSCULAR | Status: AC
  Administered 2016-03-23: 1 mg via INTRAVENOUS
  Filled 2016-03-23: qty 1

## 2016-03-23 MED ORDER — QUETIAPINE FUMARATE 50 MG PO TABS
50.0000 mg | ORAL_TABLET | Freq: Two times a day (BID) | ORAL | Status: DC
  Administered 2016-03-23 – 2016-03-28 (×10): 50 mg via ORAL
  Filled 2016-03-23 (×10): qty 1

## 2016-03-23 NOTE — Progress Notes (Signed)
eLink Physician-Brief Progress Note Patient Name: Kendra Osborne DOB: 02/16/1946 MRN: [id number]   Date of Service  03/23/2016  HPI/Events of Note  Patient anxious pulling at restraints.  Had dropped sats.  Now with RR in the 30s.  HD stable.  On fentanyl patch and Seroquel.    eICU Interventions  One time dose of ativan 1 mg IV     Intervention Category Minor Interventions: Agitation / anxiety - evaluation and management  DETERDING,ELIZABETH 03/23/2016, 2:55 AM

## 2016-03-23 NOTE — Progress Notes (Signed)
[name] Speaking Valve - Treatment Patient Details  Name: Kendra Osborne MRN: [id number] Date of Birth: 03/25/1946  Today's Date: 03/23/2016 Time: 1440-1458 SLP Time Calculation (min) (ACUTE ONLY): 18 min  Past Medical History:  Past Medical History  Diagnosis Date  . Hypertension   . Chronic diastolic CHF (congestive heart failure) [facility](HCC)    Past Surgical History:  Past Surgical History  Procedure Laterality Date  . Thyroidectomy N/A 02/28/2016    Procedure:  TOTAL THYROIDECTOMY WITH NIMS MONITOR;  Surgeon: Melvenia BeamMitchell Gore, MD;  [facility];  Service: ENT;  Laterality: N/A;  . Tracheostomy tube placement N/A 03/09/2016    Procedure: TRACHEOSTOMY;  Surgeon: Melvenia BeamMitchell Gore, MD;  [facility];  Service: ENT;  Laterality: N/A;    Assessment / Plan / Recommendation Clinical Impression    Cuff deflated upon SLP arrival. Pt alert and cooperative today with use of interpreter via phone to explain PMSV use/precautions. RR elevated to ~40 breaths/minute and PMSV removed in <5 seconds after placement. Back pressure noted upon removal of valve. Pt unable to achieve phonation with PMSV. Recommend PMSV use with speech only with full supervision. MD, please consider change to smaller, cuffless trach when medically ready.    Plan    PMSV use with speech only and full supervision   Follow Up Recommendations  Inpatient Rehab        SLP Goals Potential to Achieve Goals (ACUTE ONLY): Fair Potential Considerations (ACUTE ONLY): Ability to learn/carryover information;Medical prognosis;Severity of impairments   PMSV Trial  PMSV was placed for: <5 seconds Able to redirect subglottic air through upper airway: Yes (significant back pressure noted) Able to Attain Phonation: No Voice Quality: Aphonic Able to Expectorate Secretions: No attempts Level of Secretion Expectoration with PMSV: Not observed Breath Support for Phonation: Moderately decreased Intelligibility: Intelligibility  reduced Word: 0-24% accurate Phrase: Not tested Sentence: Not tested Conversation: Not tested Respirations During Trial:  (15-40) SpO2 During Trial:  (100) Pulse During Trial:  (70-85) Behavior: Alert;Cooperative;Controlled;Good eye contact;Responsive to questions   Tracheostomy Tube       Vent Dependency  FiO2 (%): 35 %    Cuff Deflation Trial    Kendra Osborne, Student-SLP     Tolerated Cuff Deflation: Yes Length of Time for Cuff Deflation Trial: deflated upon arrival Behavior: Alert;Controlled;Cooperative;Expresses self well;Good eye contact;Responsive to questions   Kendra Osborne 03/23/2016, 3:16 PM

## 2016-03-23 NOTE — Progress Notes (Addendum)
[place] TEAM 1 - Stepdown/ICU TEAM Progress Note  Kendra Osborne ZOX:[id number] DOB: 11-28-1946 DOA: 02/24/2016 PCP: No PCP Per Patient  Admit HPI / Brief Narrative: 70 y/o woman recently moved from [place] who presents with respiratory distress. She presented to the ED on 3/17 with complaints of dyspnea, and lower extremity edema. History is limited due to language / culture barriers, but she has a PMHx of a known goiter that has been present for years, as well as possibly hypertension. It is not clear if the goiter was ever worked up or if she ever was on regular therapy for her hypertension. Her Nephew and son (who speaks little [place]) report that she been having a cough for a few days as well as some fevers and chills.   HPI/Subjective: 4/14 patient was alert, but either could not understand or was unwilling to answer to translator speaking Urdu. Not following commands.     Assessment/Plan: Acute on Chronic respiratory failure with hypercapnia/CAP + Aspiration pneumonia 4/7 -Multifactorial to include element of pulmonary edema vs viral bronchitis/pneumonitis,extrinsic airway compression, and iatrogenic (oversedation). -Decrease Seroquel 50 mg  BID titrate medication down, -Fentanyl patch 100 g titrate these medications down -Minimize all pain medication, benzodiazepines, psychogenic medication. 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  Altered mental status -Patient appears confused today would not answer questions from translator -See acute on chronic respiratory   HTN -Patient relatively hypotensive hold all scheduled BP medication -Hydralazine IV PRN,   Chronic diastolic CHF -Strict in and out since admission - 10.9 L -Daily weight Filed Weights   03/22/16 2127 03/23/16 0500  03/24/16 0400  Weight: 94 kg (207 lb 3.7 oz) 94 kg (207 lb 3.7 oz) 96 kg (211 lb 10.3 oz)     Hypokalemia  -Resolved continue to monitor closely  Hypernatremia -Resolved   Hypomagnesemia  -Resolved  Hyponatremia  - Resolved.  Acute Renal Failure unspecified renal type - Resolved.  Dyspahgia -Nothing by mouth until patient's cognition significantly improves and can pass swallow study  Postoperative Hypothyroidism -S/P Total Thyroidectomy - Previously normal TSH. -Change Synthroid daily--> 50 g IV - Recheck TSH in 6 weeks    Code Status: FULL Family Communication: no family present at time of exam Disposition Plan: Transfer to ICU secondary aspiration    Consultants: Dr.Mitchell Gore ENT Dr.Arthur Hoss IR Dr.Daniel Daneil Dan PCCM   Procedure/Significant Events: 3/18 CTA of Chest >> mild mediastinal enlargement 3/18 CT soft tissue Neck with contrast >> multinodular goiter, meningioma 3/19 echocardiogram;- Left ventricle: moderate LVH. -LVEF=60%-65%. -(grade 1 diastolic dysfunction). - Left atrium:  moderately dilated. - Systemic veins: IVC measured 2.3 cm with < 50% respirophasic  variation, suggesting RA pressure 15 mmHg. 3/21 - Thyroidectomy PCXR 3/22: ETT & CVL in good position. Blunting bilateral costophrenic angles suggestive of effusions. Bilateral hilar fullness & interstitial prominence with low lung volumes.  3/24 - Extubation & reintubation within 2 hours due to hypoxia & secretions PCXR 3/25:. Silhouetting of the left hemidiaphragm. Platelike atelectasis right lower lung. Endotracheal tube in good position 4/6- 24 hr trach collar 4/6.IR GASTROSTOMY TUBE placed 4/7tracheostomy tube change ;old 6 cuffed proximal XLT shiley was removed with the old silk sutures, and a new 6 cuffless proximal XLT shiley was placed  4/8 - transferred back to icu for resp distress/asp pna. Trache changed to #6  cuffed XLT at bedside. CXR 4/8: incr bibasilar  atelectasis 4/12 pCXR: persistent elevation of right hemidiaphragm, bibasilar atelectasis vs infltrate 4/12 trach collar   Culture Tracheal Asp Ctx 3/25>>> ng Blood Ctx x2 3/18: Negative  MRSA PCR 3/18: Negative  Trache asp 4/8 > candida   Antibiotics: Unasyn 3/24>>>4/3; 4/8>>stop date per PCCM 4/18 Ancef 3/21 - 3/24 Clindamycin 3/21 (only 1 dose) Azithromycin 3/18 - 3/19 Rocephin 3/18 - 3/20    DVT prophylaxis: SCD   Devices    LINES / TUBES:  OETT 7.5 3/17 - 3/24; 7.0 3/24>>>3/31 Trach (ENT) 3/31>>> L Subclavian CVL 3/21>>> L Port 3/21>>> OGT 3/21 - 3/24; 3/24>>> Foley 3/18>>> PIV x1 L Radial Art Line 3/21 - 3/23 R & L Neck Drains 3/21 - ???     Continuous Infusions: . sodium chloride 10 mL/hr at 03/23/16 0311  . feeding supplement (VITAL HIGH PROTEIN) 1,000 mL (03/24/16 0318)    Objective: VITAL SIGNS: Temp: 98.4 F (36.9 C) (04/15 0400) Temp Source: Axillary (04/15 0400) BP: 110/57 mmHg (04/15 0725) Pulse Rate: 72 (04/15 0725) SPO2; FIO2:   Intake/Output Summary (Last 24 hours) at 03/24/16 0748 Last data filed at 03/23/16 1923  Gross per 24 hour  Intake   1180 ml  Output   1200 ml  Net    -20 ml     Exam: General: Patient's eyes are open, does not follow commands, does not respond to translator, chronic respiratory distress  Eyes: Negative headache, negative scleral hemorrhage ENT: Negative Runny nose, negative gingival bleeding,  Neck:  Negative scars, masses, torticollis, lymphadenopathy, JVD, trach collar in place negative sign of infection Lungs: tachypnea distant breath sounds/absent breath sounds RLL, coarse breath sounds left lung fields,negative wheezes or crackles Cardiovascular: Regular rhythm Tachycardic, Regular rhythm without murmur gallop or rub normal S1 and S2 Abdomen: Morbidly obese, negative abdominal pain, nondistended, positive soft, bowel sounds, no rebound, no ascites, no appreciable mass, PEG tube in  place Extremities: No significant cyanosis, clubbing, or edema bilateral lower extremities Psychiatric:  Unable to evaluate secondary to sedation  Neurologic:  Unable to evaluate secondary to sedation   Data Reviewed: Basic Metabolic Panel:  Recent Labs Lab 03/19/16 0437 03/20/16 0350 03/21/16 0330 03/22/16 0257 03/22/16 0845 03/23/16 0450 03/24/16 0418  NA 147* 147* 151*  --  147* 145  --   K 3.5 3.1* 3.5  --  3.8 3.5  --   CL 100* 100* 109  --  104 100*  --   CO2 34* 34* 32  --  29 35*  --   GLUCOSE 137* 164* 152*  --  173* 136*  --   BUN 44* 42* 36*  --  35* 32*  --   CREATININE 1.04* 0.93 0.80  --  0.74 0.79  --   CALCIUM 8.9 8.7* 8.5*  --  8.4* 8.4*  --   MG 2.0 2.1 1.9 1.9  --  1.8 1.8   Liver Function Tests:  Recent Labs Lab 03/18/16 0403 03/19/16 0437  AST 22 23  ALT 11* 10*  ALKPHOS 65 64  BILITOT 0.7 0.7  PROT 7.1 7.6  ALBUMIN 2.1* 2.3*   No results for input(s): LIPASE, AMYLASE in the last 168 hours. No results for input(s): AMMONIA in the last 168 hours. CBC:  Recent Labs Lab 03/18/16 0403 03/20/16 0350 03/23/16 0450  WBC 14.0* 7.7 8.7  NEUTROABS 10.4*  --   --   HGB 10.1* 9.5* 9.6*  HCT 33.1* 32.8* 33.7*  MCV 87.8  87.9 89.2  PLT 310 338 314   Cardiac Enzymes: No results for input(s): CKTOTAL, CKMB, CKMBINDEX, TROPONINI in the last 168 hours. BNP (last 3 results)  Recent Labs  02/24/16 2000  BNP 983.4*    ProBNP (last 3 results) No results for input(s): PROBNP in the last 8760 hours.  CBG:  Recent Labs Lab 03/23/16 1210 03/23/16 1609 03/23/16 2034 03/24/16 0051 03/24/16 0411  GLUCAP 121* 122* 124* 142* 120*    Recent Results (from the past 240 hour(s))  Culture, respiratory (NON-Expectorated)     Status: None   Collection Time: 03/17/16 12:48 PM  Result Value Ref Range Status   Specimen Description TRACHEAL ASPIRATE  Final   Special Requests NONE  Final   Gram Stain   Final    MODERATE WBC PRESENT, PREDOMINANTLY  PMN RARE SQUAMOUS EPITHELIAL CELLS PRESENT NO ORGANISMS SEEN Performed at [facility]    Culture   Final    FEW YEAST CONSISTENT WITH CANDIDA SPECIES Performed at [facility]    Report Status 03/20/2016 FINAL  Final     Studies:  Recent x-ray studies have been reviewed in detail by the Attending Physician  Scheduled Meds:  Scheduled Meds: . ampicillin-sulbactam (UNASYN) IV  1.5 g Intravenous Q6H  . bacitracin   Topical TID  . chlorhexidine  15 mL Mouth Rinse BID  . docusate  100 mg Oral BID  . fentaNYL  100 mcg Transdermal Q72H  . free water  300 mL Per Tube Q4H  . furosemide  40 mg Intravenous Q12H  . heparin subcutaneous  5,000 Units Subcutaneous 3 times per day  . levothyroxine  50 mcg Intravenous QAC breakfast  . pantoprazole sodium  40 mg Per Tube Daily  . polyethylene glycol  17 g Oral Daily  . QUEtiapine  50 mg Oral BID  . sodium chloride flush  10-40 mL Intracatheter Q12H    Time spent on care of this patient: 40 mins   Delvonte Berenson, Roselind Messier , MD  [facility] Office  [phone] Pager - [phone]  On-Call/Text Page:      [email or web address]      password [email or web address]  If 7PM-7AM, please contact night-coverage [email or web address] Password [email or web address] 03/24/2016, 7:48 AM   LOS: 28 days   Care during the described time interval was provided by me .  I have reviewed this patient's available data, including medical history, events of note, physical examination, and all test results as part of my evaluation. I have personally reviewed and interpreted all radiology studies.   Carolyne Littles, MD [phone] Pager

## 2016-03-23 NOTE — Progress Notes (Signed)
Pharmacy Antibiotic Note  Juliane Pootaseem Icenhour is a 70 y.o. female admitted on 02/24/2016 with respiratory distress and aspiration pneumonia.  Pharmacy has been consulted for Unasyn dosing- currently day #6.   Plan: Continue Unasyn IV 1.5g q6h - consider adding stop date.  F/u cx, de-escalation plans  Height: 5' (152.4 cm) Weight: 207 lb 3.7 oz (94 kg) IBW/kg (Calculated) : 45.5  Temp (24hrs), Avg:99 F (37.2 C), Min:97.5 F (36.4 C), Max:99.8 F (37.7 C)   Recent Labs Lab 03/17/16 0521 03/18/16 0403 03/19/16 0437 03/20/16 0350 03/21/16 0330 03/22/16 0845 03/23/16 0450  WBC 10.3 14.0*  --  7.7  --   --  8.7  CREATININE 0.90 1.26* 1.04* 0.93 0.80 0.74 0.79    Estimated Creatinine Clearance: 69 mL/min (by C-G formula based on Cr of 0.79).    Not on File  Antimicrobials this admission: Unasyn 3/24>>4/3, resumed 4/8 >> Ancef 3/21 - 3/24 Clinda 3/21 (only 1 dose) Azith 3/18 - 3/19 Rocephin 3/18 - 3/20  Microbiology results: Tracheal Asp Ctx 3/25>>> ng Blood Ctx x2 3/18:  Negative   MRSA PCR 3/18:  Negative  Resp cx 4/8 >> normal flora  Thank you for allowing pharmacy to be a part of this patient's care.  Fayne NorrieMillen, Larisha Vencill Brown, PharmD 03/23/2016 12:07 PM

## 2016-03-24 DIAGNOSIS — J189 Pneumonia, unspecified organism: Secondary | ICD-10-CM | POA: Diagnosis present

## 2016-03-24 DIAGNOSIS — J9622 Acute and chronic respiratory failure with hypercapnia: Secondary | ICD-10-CM | POA: Diagnosis present

## 2016-03-24 LAB — COMPREHENSIVE METABOLIC PANEL
ALT: 16 U/L (ref 14–54)
AST: 15 U/L (ref 15–41)
Albumin: 1.7 g/dL — ABNORMAL LOW (ref 3.5–5.0)
Alkaline Phosphatase: 75 U/L (ref 38–126)
Anion gap: 12 (ref 5–15)
BILIRUBIN TOTAL: 0.4 mg/dL (ref 0.3–1.2)
BUN: 110 mg/dL — AB (ref 6–20)
CO2: 24 mmol/L (ref 22–32)
Calcium: 8.2 mg/dL — ABNORMAL LOW (ref 8.9–10.3)
Chloride: 100 mmol/L — ABNORMAL LOW (ref 101–111)
Creatinine, Ser: 5.27 mg/dL — ABNORMAL HIGH (ref 0.44–1.00)
GFR calc Af Amer: 9 mL/min — ABNORMAL LOW (ref >60)
GFR, EST NON AFRICAN AMERICAN: 8 mL/min — AB (ref >60)
Glucose, Bld: 105 mg/dL — ABNORMAL HIGH (ref 65–99)
POTASSIUM: 4.7 mmol/L (ref 3.5–5.1)
Sodium: 136 mmol/L (ref 135–145)
TOTAL PROTEIN: 5.5 g/dL — AB (ref 6.5–8.1)

## 2016-03-24 LAB — GLUCOSE, CAPILLARY
GLUCOSE-CAPILLARY: 120 mg/dL — AB (ref 65–99)
GLUCOSE-CAPILLARY: 122 mg/dL — AB (ref 65–99)
GLUCOSE-CAPILLARY: 118 mg/dL — AB (ref 65–99)
Glucose-Capillary: 142 mg/dL — ABNORMAL HIGH (ref 65–99)
Glucose-Capillary: 145 mg/dL — ABNORMAL HIGH (ref 65–99)
Glucose-Capillary: 159 mg/dL — ABNORMAL HIGH (ref 65–99)

## 2016-03-24 LAB — CBC WITH DIFFERENTIAL/PLATELET
BASOS ABS: 0.4 10*3/uL — AB (ref 0.0–0.1)
Basophils Relative: 2 %
EOS ABS: 0.2 10*3/uL (ref 0.0–0.7)
Eosinophils Relative: 1 %
HCT: 29.6 % — ABNORMAL LOW (ref 36.0–46.0)
HEMOGLOBIN: 9.6 g/dL — AB (ref 12.0–15.0)
LYMPHS ABS: 1.6 10*3/uL (ref 0.7–4.0)
Lymphocytes Relative: 7 %
MCH: 30.2 pg (ref 26.0–34.0)
MCHC: 32.4 g/dL (ref 30.0–36.0)
MCV: 93.1 fL (ref 78.0–100.0)
MONO ABS: 1.1 10*3/uL — AB (ref 0.1–1.0)
MONOS PCT: 5 %
Neutro Abs: 19.1 10*3/uL — ABNORMAL HIGH (ref 1.7–7.7)
Neutrophils Relative %: 85 %
PLATELETS: 157 10*3/uL (ref 150–400)
RBC: 3.18 MIL/uL — AB (ref 3.87–5.11)
RDW: 15.9 % — AB (ref 11.5–15.5)
WBC: 22.4 10*3/uL — AB (ref 4.0–10.5)

## 2016-03-24 LAB — MAGNESIUM: MAGNESIUM: 1.8 mg/dL (ref 1.7–2.4)

## 2016-03-24 MED ORDER — ACETAMINOPHEN 160 MG/5ML PO SOLN
650.0000 mg | Freq: Four times a day (QID) | ORAL | Status: DC | PRN
  Administered 2016-03-24 – 2016-03-25 (×2): 650 mg via ORAL
  Filled 2016-03-24 (×2): qty 20.3

## 2016-03-24 MED ORDER — AMPICILLIN-SULBACTAM SODIUM 1.5 (1-0.5) G IJ SOLR
1.5000 g | Freq: Two times a day (BID) | INTRAMUSCULAR | Status: DC
  Administered 2016-03-24 – 2016-03-25 (×2): 1.5 g via INTRAVENOUS
  Filled 2016-03-24 (×3): qty 1.5

## 2016-03-24 NOTE — Progress Notes (Signed)
Pharmacy Antibiotic Note  Kendra Osborne is a 70 y.o. female admitted on 02/24/2016 with respiratory distress and aspiration pneumonia.  Pharmacy has been consulted for Unasyn dosing  Currently day #7 of Unasyn for aspiration PNA. CCM recommends treating for 10 days through 4/18. Afebrile, WBC back up to 22.4 today. SCr jumped to 5.27 today after being stable, CrCl ~5810ml/min  Plan: Change Unasyn to 1.5g IV Q12 Monitor clinical picture, renal function F/U LOT   Height: 5' (152.4 cm) Weight: 211 lb 10.3 oz (96 kg) IBW/kg (Calculated) : 45.5  Temp (24hrs), Avg:98.8 F (37.1 C), Min:98.4 F (36.9 C), Max:99.3 F (37.4 C)   Recent Labs Lab 03/18/16 0403  03/20/16 0350 03/21/16 0330 03/22/16 0845 03/23/16 0450 03/24/16 0943  WBC 14.0*  --  7.7  --   --  8.7 22.4*  CREATININE 1.26*  < > 0.93 0.80 0.74 0.79 5.27*  < > = values in this interval not displayed.  Estimated Creatinine Clearance: 10.6 mL/min (by C-G formula based on Cr of 5.27).    Not on File  Antimicrobials this admission: Unasyn 3/24>>4/3, resumed 4/8 >> Ancef 3/21 - 3/24 Clinda 3/21 (only 1 dose) Azith 3/18 - 3/19 Rocephin 3/18 - 3/20  Microbiology results: Tracheal Asp Ctx 3/25>>> ng Blood Ctx x2 3/18:  Negative   MRSA PCR 3/18:  Negative  Resp cx 4/8 >> normal flora  Thank you for allowing pharmacy to be a part of this patient's care.  Enzo BiNathan Destenee Osborne, PharmD, BCPS Clinical Pharmacist Pager [phone] 03/24/2016 12:18 PM

## 2016-03-24 NOTE — Progress Notes (Signed)
[place] TEAM 1 - Stepdown/ICU TEAM Progress Note  Kendra Osborne ZOX:[id number] DOB: Oct 16, 1946 DOA: 02/24/2016 PCP: No PCP Per Patient  Admit HPI / Brief Narrative: 70 y/o woman recently moved from [place] (speaks Urdu) who presents with respiratory distress. She presented to the ED on 3/17 with complaints of dyspnea, and lower extremity edema. History is limited due to language / culture barriers, but she has a PMHx of a known goiter that has been present for years, as well as possibly hypertension. It is not clear if the goiter was ever worked up or if she ever was on regular therapy for her hypertension. Her Nephew and son (who speaks little [place]) report that she been having a cough for a few days as well as some fevers and chills.   HPI/Subjective: 4/15 much more alert today. Dr. Isidoro Donning helped translate. Negative SOB, negative CP, positive headache. late was alert, but either could not understand or was unwilling to answer to translator speaking Urdu. Not following commands.     Assessment/Plan: Acute on Chronic respiratory failure with hypercapnia/CAP + Aspiration pneumonia 4/7 -Multifactorial to include element of pulmonary edema vs viral bronchitis/pneumonitis,extrinsic airway compression, and iatrogenic (oversedation). -Decrease Seroquel 50 mg  BID titrate medication down, -Fentanyl patch 100 g titrate these medications down -Minimize all pain medication, benzodiazepines, psychogenic medication. patient extremely sensitive -Have spoken to NP Florentina Addison Will for ICU #6 cuffed trach and placed back on mechanical ventilation -Continue antibiotics stop date per [facility] 4/18 -Patient much more awake today Dr. Isidoro Donning explained to patient that we would attempt to place passey [name] valve.  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  Altered mental status -See acute on chronic respiratory   HTN -Patient relatively hypotensive hold all scheduled BP  medication -Hydralazine IV PRN,   Chronic diastolic CHF -Strict in and out since admission - 7.6 L -Daily weight Filed Weights   03/22/16 2127 03/23/16 0500 03/24/16 0400  Weight: 94 kg (207 lb 3.7 oz) 94 kg (207 lb 3.7 oz) 96 kg (211 lb 10.3 oz)     Acute Renal Failure unspecified renal type - Resolved.  Dyspahgia -Patient much more awake will request swallow study for 4/16   Postoperative Hypothyroidism -S/P Total Thyroidectomy - Previously normal TSH. -Synthroid  50 g IV - Recheck TSH in 6 weeks    Code Status: FULL Family Communication: no family present at time of exam Disposition Plan: Transfer to ICU secondary aspiration    Consultants: Dr.Mitchell Gore ENT Dr.Arthur Hoss IR Dr.Daniel Daneil Dan PCCM   Procedure/Significant Events: 3/18 CTA of Chest >> mild mediastinal enlargement 3/18 CT soft tissue Neck with contrast >> multinodular goiter, meningioma 3/19 echocardiogram;- Left ventricle: moderate LVH. -LVEF=60%-65%. -(grade 1 diastolic dysfunction). - Left atrium:  moderately dilated. - Systemic veins: IVC measured 2.3 cm with < 50% respirophasic  variation, suggesting RA pressure 15 mmHg. 3/21 - Thyroidectomy PCXR 3/22: ETT & CVL in good position. Blunting bilateral costophrenic angles suggestive of effusions. Bilateral hilar fullness & interstitial prominence with low lung volumes.  3/24 - Extubation & reintubation within 2 hours due to hypoxia & secretions PCXR 3/25:. Silhouetting of the left hemidiaphragm. Platelike atelectasis right lower lung. Endotracheal tube in good position 4/6- 24 hr trach collar 4/6.IR GASTROSTOMY TUBE placed 4/7tracheostomy tube change ;old 6 cuffed proximal XLT shiley was removed with the old silk sutures, and a new 6 cuffless proximal XLT shiley was placed  4/8 - transferred back to icu for resp distress/asp pna.  Trache changed to #6 cuffed XLT at bedside. CXR 4/8: incr bibasilar atelectasis 4/12 pCXR: persistent  elevation of right hemidiaphragm, bibasilar atelectasis vs infltrate 4/12 trach collar   Culture Tracheal Asp Ctx 3/25>>> ng Blood Ctx x2 3/18: Negative  MRSA PCR 3/18: Negative  Trache asp 4/8 > candida   Antibiotics: Unasyn 3/24>>>4/3; 4/8>>stop date per PCCM 4/18 Ancef 3/21 - 3/24 Clindamycin 3/21 (only 1 dose) Azithromycin 3/18 - 3/19 Rocephin 3/18 - 3/20    DVT prophylaxis: SCD   Devices    LINES / TUBES:  OETT 7.5 3/17 - 3/24; 7.0 3/24>>>3/31 Trach (ENT) 3/31>>> L Subclavian CVL 3/21>>> L Port 3/21>>> OGT 3/21 - 3/24; 3/24>>> Foley 3/18>>> PIV x1 L Radial Art Line 3/21 - 3/23 R & L Neck Drains 3/21 - ???     Continuous Infusions: . sodium chloride 10 mL/hr at 03/23/16 0311  . feeding supplement (VITAL HIGH PROTEIN) 1,000 mL (03/24/16 0318)    Objective: VITAL SIGNS: Temp: 99.3 F (37.4 C) (04/15 0800) Temp Source: Axillary (04/15 0800) BP: 116/66 mmHg (04/15 0800) Pulse Rate: 81 (04/15 0800) SPO2; FIO2:   Intake/Output Summary (Last 24 hours) at 03/24/16 1026 Last data filed at 03/24/16 0916  Gross per 24 hour  Intake   1235 ml  Output   2030 ml  Net   -795 ml     Exam: General: Alert agitated but responded well to Dr. Isidoro Donningai was able to translate our intentions, positive headache, positive chronic respiratory distress  Eyes: Negative headache, negative scleral hemorrhage ENT: Negative Runny nose, negative gingival bleeding,  Neck:  Negative scars, masses, torticollis, lymphadenopathy, JVD, trach collar in place negative sign of infection Lungs: clear to ossification bilateral, negative wheezes or crackles Cardiovascular: Regular rhythm Tachycardic, Regular rhythm without murmur gallop or rub normal S1 and S2 Abdomen: Morbidly obese, negative abdominal pain, nondistended, positive soft, bowel sounds, no rebound, no ascites, no appreciable mass, PEG tube in place Extremities: No significant cyanosis, clubbing, or edema bilateral lower  extremities Psychiatric:  Somewhat agitated.  Neurologic:  Moves all extremities spontaneously, follows commands   Data Reviewed: Basic Metabolic Panel:  Recent Labs Lab 03/19/16 0437 03/20/16 0350 03/21/16 0330 03/22/16 0257 03/22/16 0845 03/23/16 0450 03/24/16 0418  NA 147* 147* 151*  --  147* 145  --   K 3.5 3.1* 3.5  --  3.8 3.5  --   CL 100* 100* 109  --  104 100*  --   CO2 34* 34* 32  --  29 35*  --   GLUCOSE 137* 164* 152*  --  173* 136*  --   BUN 44* 42* 36*  --  35* 32*  --   CREATININE 1.04* 0.93 0.80  --  0.74 0.79  --   CALCIUM 8.9 8.7* 8.5*  --  8.4* 8.4*  --   MG 2.0 2.1 1.9 1.9  --  1.8 1.8   Liver Function Tests:  Recent Labs Lab 03/18/16 0403 03/19/16 0437  AST 22 23  ALT 11* 10*  ALKPHOS 65 64  BILITOT 0.7 0.7  PROT 7.1 7.6  ALBUMIN 2.1* 2.3*   No results for input(s): LIPASE, AMYLASE in the last 168 hours. No results for input(s): AMMONIA in the last 168 hours. CBC:  Recent Labs Lab 03/18/16 0403 03/20/16 0350 03/23/16 0450  WBC 14.0* 7.7 8.7  NEUTROABS 10.4*  --   --   HGB 10.1* 9.5* 9.6*  HCT 33.1* 32.8* 33.7*  MCV 87.8 87.9 89.2  PLT 310  338 314   Cardiac Enzymes: No results for input(s): CKTOTAL, CKMB, CKMBINDEX, TROPONINI in the last 168 hours. BNP (last 3 results)  Recent Labs  02/24/16 2000  BNP 983.4*    ProBNP (last 3 results) No results for input(s): PROBNP in the last 8760 hours.  CBG:  Recent Labs Lab 03/23/16 1609 03/23/16 2034 03/24/16 0051 03/24/16 0411 03/24/16 0747  GLUCAP 122* 124* 142* 120* 122*    Recent Results (from the past 240 hour(s))  Culture, respiratory (NON-Expectorated)     Status: None   Collection Time: 03/17/16 12:48 PM  Result Value Ref Range Status   Specimen Description TRACHEAL ASPIRATE  Final   Special Requests NONE  Final   Gram Stain   Final    MODERATE WBC PRESENT, PREDOMINANTLY PMN RARE SQUAMOUS EPITHELIAL CELLS PRESENT NO ORGANISMS SEEN Performed at [facility]    Culture   Final    FEW YEAST CONSISTENT WITH CANDIDA SPECIES Performed at [facility]    Report Status 03/20/2016 FINAL  Final     Studies:  Recent x-ray studies have been reviewed in detail by the Attending Physician  Scheduled Meds:  Scheduled Meds: . ampicillin-sulbactam (UNASYN) IV  1.5 g Intravenous Q6H  . bacitracin   Topical TID  . chlorhexidine  15 mL Mouth Rinse BID  . docusate  100 mg Oral BID  . fentaNYL  100 mcg Transdermal Q72H  . free water  300 mL Per Tube Q4H  . furosemide  40 mg Intravenous Q12H  . heparin subcutaneous  5,000 Units Subcutaneous 3 times per day  . levothyroxine  50 mcg Intravenous QAC breakfast  . pantoprazole sodium  40 mg Per Tube Daily  . polyethylene glycol  17 g Oral Daily  . QUEtiapine  50 mg Oral BID  . sodium chloride flush  10-40 mL Intracatheter Q12H    Time spent on care of this patient: 40 mins   Keelee Yankey, Roselind Messier , MD  [facility] Office  [phone] Pager - [phone]  On-Call/Text Page:      [email or web address]      password [email or web address]  If 7PM-7AM, please contact night-coverage [email or web address] Password [email or web address] 03/24/2016, 10:26 AM   LOS: 28 days   Care during the described time interval was provided by me .  I have reviewed this patient's available data, including medical history, events of note, physical examination, and all test results as part of my evaluation. I have personally reviewed and interpreted all radiology studies.   Carolyne Littles, MD [phone] Pager

## 2016-03-25 ENCOUNTER — Inpatient Hospital Stay (HOSPITAL_COMMUNITY): Payer: Medicaid Other

## 2016-03-25 LAB — CBC WITH DIFFERENTIAL/PLATELET
BASOS PCT: 0 %
Basophils Absolute: 0 10*3/uL (ref 0.0–0.1)
EOS ABS: 0.1 10*3/uL (ref 0.0–0.7)
Eosinophils Relative: 1 %
HEMATOCRIT: 34 % — AB (ref 36.0–46.0)
HEMOGLOBIN: 10.1 g/dL — AB (ref 12.0–15.0)
LYMPHS ABS: 2.1 10*3/uL (ref 0.7–4.0)
Lymphocytes Relative: 21 %
MCH: 25.6 pg — AB (ref 26.0–34.0)
MCHC: 29.7 g/dL — AB (ref 30.0–36.0)
MCV: 86.3 fL (ref 78.0–100.0)
MONOS PCT: 8 %
Monocytes Absolute: 0.8 10*3/uL (ref 0.1–1.0)
NEUTROS ABS: 7 10*3/uL (ref 1.7–7.7)
NEUTROS PCT: 69 %
Platelets: 308 10*3/uL (ref 150–400)
RBC: 3.94 MIL/uL (ref 3.87–5.11)
RDW: 18.4 % — ABNORMAL HIGH (ref 11.5–15.5)
WBC: 10.1 10*3/uL (ref 4.0–10.5)

## 2016-03-25 LAB — COMPREHENSIVE METABOLIC PANEL
ALBUMIN: 2.3 g/dL — AB (ref 3.5–5.0)
ALK PHOS: 83 U/L (ref 38–126)
ALT: 23 U/L (ref 14–54)
AST: 32 U/L (ref 15–41)
Anion gap: 11 (ref 5–15)
BUN: 31 mg/dL — AB (ref 6–20)
CALCIUM: 8.5 mg/dL — AB (ref 8.9–10.3)
CHLORIDE: 90 mmol/L — AB (ref 101–111)
CO2: 37 mmol/L — AB (ref 22–32)
CREATININE: 0.81 mg/dL (ref 0.44–1.00)
GFR calc Af Amer: >60 mL/min (ref >60)
GFR calc non Af Amer: >60 mL/min (ref >60)
GLUCOSE: 142 mg/dL — AB (ref 65–99)
Potassium: 3.3 mmol/L — ABNORMAL LOW (ref 3.5–5.1)
SODIUM: 138 mmol/L (ref 135–145)
Total Bilirubin: 0.7 mg/dL (ref 0.3–1.2)
Total Protein: 7.7 g/dL (ref 6.5–8.1)

## 2016-03-25 LAB — GLUCOSE, CAPILLARY
GLUCOSE-CAPILLARY: 135 mg/dL — AB (ref 65–99)
GLUCOSE-CAPILLARY: 160 mg/dL — AB (ref 65–99)
GLUCOSE-CAPILLARY: 180 mg/dL — AB (ref 65–99)
GLUCOSE-CAPILLARY: 199 mg/dL — AB (ref 65–99)
Glucose-Capillary: 138 mg/dL — ABNORMAL HIGH (ref 65–99)
Glucose-Capillary: 146 mg/dL — ABNORMAL HIGH (ref 65–99)
Glucose-Capillary: 180 mg/dL — ABNORMAL HIGH (ref 65–99)

## 2016-03-25 LAB — MAGNESIUM
MAGNESIUM: 1.6 mg/dL — AB (ref 1.7–2.4)
Magnesium: 1.7 mg/dL (ref 1.7–2.4)

## 2016-03-25 MED ORDER — MORPHINE SULFATE (PF) 2 MG/ML IV SOLN
2.0000 mg | Freq: Once | INTRAVENOUS | Status: AC
  Administered 2016-03-25: 2 mg via INTRAVENOUS
  Filled 2016-03-25: qty 1

## 2016-03-25 MED ORDER — SODIUM CHLORIDE 0.9 % IV SOLN
1.5000 g | Freq: Four times a day (QID) | INTRAVENOUS | Status: AC
Start: 2016-03-25 — End: 2016-03-27
  Administered 2016-03-25 – 2016-03-27 (×10): 1.5 g via INTRAVENOUS
  Filled 2016-03-25 (×10): qty 1.5

## 2016-03-25 MED ORDER — MAGNESIUM CITRATE PO SOLN
0.5000 | Freq: Once | ORAL | Status: AC
  Administered 2016-03-25: 0.5
  Filled 2016-03-25: qty 296

## 2016-03-25 MED ORDER — MORPHINE SULFATE (PF) 2 MG/ML IV SOLN
1.0000 mg | INTRAVENOUS | Status: DC | PRN
  Administered 2016-03-25 – 2016-03-28 (×8): 2 mg via INTRAVENOUS
  Administered 2016-03-28: 1 mg via INTRAVENOUS
  Administered 2016-03-28 – 2016-04-04 (×7): 2 mg via INTRAVENOUS
  Filled 2016-03-25 (×16): qty 1

## 2016-03-25 MED ORDER — MAGNESIUM SULFATE 50 % IJ SOLN
3.0000 g | Freq: Once | INTRAVENOUS | Status: AC
  Administered 2016-03-25: 3 g via INTRAVENOUS
  Filled 2016-03-25: qty 6

## 2016-03-25 NOTE — Progress Notes (Signed)
[place] TEAM 1 - Stepdown/ICU TEAM Progress Note  Kendra Osborne TMH:[id number] DOB: 29-Aug-1946 DOA: 02/24/2016 PCP: No PCP Per Patient  Admit HPI / Brief Narrative: 70 y/o woman recently moved from [place] (speaks Urdu) who presents with respiratory distress. She presented to the ED on 3/17 with complaints of dyspnea, and lower extremity edema. History is limited due to language / culture barriers, but she has a PMHx of a known goiter that has been present for years, as well as possibly hypertension. It is not clear if the goiter was ever worked up or if she ever was on regular therapy for her hypertension. Her Nephew and son (who speaks little [place]) report that she been having a cough for a few days as well as some fevers and chills.   HPI/Subjective: 4/16 alert, calm however still restrained. Indicates negative pain. However when family arrived patient informed them that she was having RLQ abdominal pain.   Assessment/Plan: Acute on Chronic respiratory failure with hypercapnia/CAP + Aspiration pneumonia 4/7 -Multifactorial to include element of pulmonary edema vs viral bronchitis/pneumonitis,extrinsic airway compression, and iatrogenic (oversedation). -Seroquel 50 mg  BID, -Fentanyl patch 100 g titrate these medications down -PRN morphine for RLQ abdominal pain.  -Minimize all pain medication, benzodiazepines, psychogenic medication. patient extremely sensitive -Have spoken to NP Florentina Addison Will for ICU #6 cuffed trach and placed back on mechanical ventilation -Continue antibiotics stop date per [facility] 4/18 -4/16 spoke with Kendal Hymen from speech and she informed me that they had attempted to use [name] valve on patient's current #6 XLT cuffed trach and patient was unable to tolerate.  -Requested that trach team changed out the #6 XLT cuffed trach to a #4 XLT uncuffed morning of 4/17. Will then contact speech for [name] valve trial and swallow study.  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  Altered mental status -See acute on chronic respiratory   HTN -Patient relatively hypotensive hold all scheduled BP medication -Hydralazine IV PRN,   Chronic diastolic CHF -Strict in and out since admission - 4.4 L -Daily weight Filed Weights   03/23/16 0500 03/24/16 0400 03/25/16 0338  Weight: 94 kg (207 lb 3.7 oz) 96 kg (211 lb 10.3 oz) 93.9 kg (207 lb 0.2 oz)     Acute Renal Failure unspecified renal type - Resolved.  Dyspahgia -Swallow study for 4/17 pending  Postoperative Hypothyroidism -S/P Total Thyroidectomy - Previously normal TSH. -Synthroid  50 g IV - Recheck TSH in 6 weeks  Hypomagnesemia -Magnesium goal> 2 -Magnesium IV 3 gm  Abdominal pain -KUB negative, most likely secondary to PEG tube, moderate amount stool. -Start minimal amount of morphine PRN -Start mag citrate PRN    Code Status: FULL Family Communication: Spoke with son Kendra Osborne over phone.  Disposition Plan: CIR vs SNF once more stable    Consultants: Dr.Mitchell Gore ENT Dr.Arthur Hoss IR Dr.Daniel Daneil Dan PCCM   Procedure/Significant Events: 3/18 CTA of Chest >> mild mediastinal enlargement 3/18 CT soft tissue Neck with contrast >> multinodular goiter, meningioma 3/19 echocardiogram;- Left ventricle: moderate LVH. -LVEF=60%-65%. -(grade 1 diastolic dysfunction). - Left atrium:  moderately dilated. - Systemic veins: IVC measured 2.3 cm with < 50% respirophasic  variation, suggesting RA pressure 15 mmHg. 3/21 - Thyroidectomy PCXR 3/22: ETT & CVL in good position. Blunting bilateral costophrenic angles suggestive of effusions. Bilateral hilar fullness & interstitial prominence with low lung volumes.  3/24 - Extubation & reintubation within 2 hours due to hypoxia & secretions PCXR 3/25:. Silhouetting  of the left hemidiaphragm. Platelike atelectasis right lower lung. Endotracheal tube in good position 4/6- 24 hr trach  collar 4/6.IR GASTROSTOMY TUBE placed 4/7tracheostomy tube change ;old 6 cuffed proximal XLT shiley was removed with the old silk sutures, and a new 6 cuffless proximal XLT shiley was placed  4/8 - transferred back to icu for resp distress/asp pna. Trache changed to #6 cuffed XLT at bedside. CXR 4/8: incr bibasilar atelectasis 4/12 pCXR: persistent elevation of right hemidiaphragm, bibasilar atelectasis vs infltrate 4/12 trach collar   Culture Tracheal Asp Ctx 3/25>>> ng Blood Ctx x2 3/18: Negative  MRSA PCR 3/18: Negative  Trache asp 4/8 > candida   Antibiotics: Unasyn 3/24>>>4/3; 4/8>>stop date per PCCM 4/18 Ancef 3/21 - 3/24 Clindamycin 3/21 (only 1 dose) Azithromycin 3/18 - 3/19 Rocephin 3/18 - 3/20    DVT prophylaxis: SCD   Devices    LINES / TUBES:  OETT 7.5 3/17 - 3/24; 7.0 3/24>>>3/31 Trach (ENT) 3/31>>> L Subclavian CVL 3/21>>> L Port 3/21>>> OGT 3/21 - 3/24; 3/24>>> Foley 3/18>>> PIV x1 L Radial Art Line 3/21 - 3/23 R & L Neck Drains 3/21 - ???     Continuous Infusions: . sodium chloride 10 mL/hr at 03/24/16 1351  . feeding supplement (VITAL HIGH PROTEIN) 1,000 mL (03/24/16 2330)    Objective: VITAL SIGNS: Temp: 98 F (36.7 C) (04/16 0338) Temp Source: Axillary (04/16 0338) BP: 175/78 mmHg (04/16 0448) Pulse Rate: 93 (04/16 0349) SPO2; FIO2:   Intake/Output Summary (Last 24 hours) at 03/25/16 1610 Last data filed at 03/25/16 0600  Gross per 24 hour  Intake   3835 ml  Output   2380 ml  Net   1455 ml     Exam: General: Alert Comfortable, still does not have [employer] valve, positive chronic respiratory distress  Eyes: Negative headache, negative scleral hemorrhage ENT: Negative Runny nose, negative gingival bleeding,  Neck:  Negative scars, masses, torticollis, lymphadenopathy, JVD, trach collar in place negative sign of infection Lungs: clear to ossification bilateral, negative wheezes or crackles Cardiovascular: Regular  rhythm and rate, without murmur gallop or rub normal S1 and S2 Abdomen: Morbidly obese, negative abdominal pain, nondistended, positive soft, bowel sounds, no rebound, no ascites, no appreciable mass, PEG tube in place Extremities: No significant cyanosis, clubbing, or edema bilateral lower extremities Psychiatric:  Somewhat agitated.  Neurologic:  Moves all extremities spontaneously, follows some commands   Data Reviewed: Basic Metabolic Panel:  Recent Labs Lab 03/20/16 0350 03/21/16 0330 03/22/16 0257 03/22/16 0845 03/23/16 0450 03/24/16 0418 03/24/16 0943 03/25/16 0505  NA 147* 151*  --  147* 145  --  136  --   K 3.1* 3.5  --  3.8 3.5  --  4.7  --   CL 100* 109  --  104 100*  --  100*  --   CO2 34* 32  --  29 35*  --  24  --   GLUCOSE 164* 152*  --  173* 136*  --  105*  --   BUN 42* 36*  --  35* 32*  --  110*  --   CREATININE 0.93 0.80  --  0.74 0.79  --  5.27*  --   CALCIUM 8.7* 8.5*  --  8.4* 8.4*  --  8.2*  --   MG 2.1 1.9 1.9  --  1.8 1.8  --  1.6*   Liver Function Tests:  Recent Labs Lab 03/19/16 0437 03/24/16 0943  AST 23 15  ALT 10* 16  ALKPHOS 64 75  BILITOT 0.7 0.4  PROT 7.6 5.5*  ALBUMIN 2.3* 1.7*   No results for input(s): LIPASE, AMYLASE in the last 168 hours. No results for input(s): AMMONIA in the last 168 hours. CBC:  Recent Labs Lab 03/20/16 0350 03/23/16 0450 03/24/16 0943  WBC 7.7 8.7 22.4*  NEUTROABS  --   --  19.1*  HGB 9.5* 9.6* 9.6*  HCT 32.8* 33.7* 29.6*  MCV 87.9 89.2 93.1  PLT 338 314 157   Cardiac Enzymes: No results for input(s): CKTOTAL, CKMB, CKMBINDEX, TROPONINI in the last 168 hours. BNP (last 3 results)  Recent Labs  02/24/16 2000  BNP 983.4*    ProBNP (last 3 results) No results for input(s): PROBNP in the last 8760 hours.  CBG:  Recent Labs Lab 03/24/16 0747 03/24/16 1235 03/24/16 1545 03/24/16 1924 03/24/16 2331  GLUCAP 122* 118* 145* 159* 160*    Recent Results (from the past 240 hour(s))    Culture, respiratory (NON-Expectorated)     Status: None   Collection Time: 03/17/16 12:48 PM  Result Value Ref Range Status   Specimen Description TRACHEAL ASPIRATE  Final   Special Requests NONE  Final   Gram Stain   Final    MODERATE WBC PRESENT, PREDOMINANTLY PMN RARE SQUAMOUS EPITHELIAL CELLS PRESENT NO ORGANISMS SEEN Performed at [facility]    Culture   Final    FEW YEAST CONSISTENT WITH CANDIDA SPECIES Performed at [facility]    Report Status 03/20/2016 FINAL  Final     Studies:  Recent x-ray studies have been reviewed in detail by the Attending Physician  Scheduled Meds:  Scheduled Meds: . ampicillin-sulbactam (UNASYN) IV  1.5 g Intravenous Q12H  . bacitracin   Topical TID  . chlorhexidine  15 mL Mouth Rinse BID  . docusate  100 mg Oral BID  . fentaNYL  100 mcg Transdermal Q72H  . free water  300 mL Per Tube Q4H  . furosemide  40 mg Intravenous Q12H  . heparin subcutaneous  5,000 Units Subcutaneous 3 times per day  . levothyroxine  50 mcg Intravenous QAC breakfast  . pantoprazole sodium  40 mg Per Tube Daily  . polyethylene glycol  17 g Oral Daily  . QUEtiapine  50 mg Oral BID  . sodium chloride flush  10-40 mL Intracatheter Q12H    Time spent on care of this patient: 40 mins   WOODS, Roselind MessierURTIS J , MD  [facility] Office  [phone] Pager - [phone]  On-Call/Text Page:      [email or web address]      password [email or web address]  If 7PM-7AM, please contact night-coverage [email or web address] Password [email or web address] 03/25/2016, 6:39 AM   LOS: 29 days   Care during the described time interval was provided by me .  I have reviewed this patient's available data, including medical history, events of note, physical examination, and all test results as part of my evaluation. I have personally reviewed and interpreted all radiology studies.   Carolyne Littlesurtis Woods, MD [phone] Pager

## 2016-03-26 DIAGNOSIS — J9622 Acute and chronic respiratory failure with hypercapnia: Secondary | ICD-10-CM

## 2016-03-26 LAB — MAGNESIUM: MAGNESIUM: 2.9 mg/dL — AB (ref 1.7–2.4)

## 2016-03-26 LAB — BASIC METABOLIC PANEL
Anion gap: 9 (ref 5–15)
BUN: 35 mg/dL — AB (ref 6–20)
CHLORIDE: 89 mmol/L — AB (ref 101–111)
CO2: 40 mmol/L — AB (ref 22–32)
CREATININE: 0.85 mg/dL (ref 0.44–1.00)
Calcium: 8.2 mg/dL — ABNORMAL LOW (ref 8.9–10.3)
GFR calc Af Amer: >60 mL/min (ref >60)
GFR calc non Af Amer: >60 mL/min (ref >60)
Glucose, Bld: 143 mg/dL — ABNORMAL HIGH (ref 65–99)
Potassium: 3.2 mmol/L — ABNORMAL LOW (ref 3.5–5.1)
Sodium: 138 mmol/L (ref 135–145)

## 2016-03-26 LAB — GLUCOSE, CAPILLARY
GLUCOSE-CAPILLARY: 136 mg/dL — AB (ref 65–99)
GLUCOSE-CAPILLARY: 154 mg/dL — AB (ref 65–99)
Glucose-Capillary: 119 mg/dL — ABNORMAL HIGH (ref 65–99)
Glucose-Capillary: 158 mg/dL — ABNORMAL HIGH (ref 65–99)
Glucose-Capillary: 147 mg/dL — ABNORMAL HIGH (ref 65–99)

## 2016-03-26 MED ORDER — ALBUTEROL SULFATE (2.5 MG/3ML) 0.083% IN NEBU
2.5000 mg | INHALATION_SOLUTION | RESPIRATORY_TRACT | Status: DC | PRN
  Administered 2016-03-26 – 2016-03-28 (×5): 2.5 mg via RESPIRATORY_TRACT
  Filled 2016-03-26 (×5): qty 3

## 2016-03-26 MED ORDER — POTASSIUM CHLORIDE 20 MEQ/15ML (10%) PO SOLN
40.0000 meq | Freq: Once | ORAL | Status: AC
  Administered 2016-03-26: 40 meq via ORAL
  Filled 2016-03-26: qty 30

## 2016-03-26 NOTE — Progress Notes (Signed)
PULMONARY / CRITICAL CARE MEDICINE    Name: Kendra Osborne MRN: [id number] DOB: [birth year]    ADMISSION DATE:  02/24/2016  PRIMARY SERVICE: PCCM  CHIEF COMPLAINT:  Dyspnea  BRIEF PATIENT DESCRIPTION: 70 y/o woman recently moved from [place] who presents with respiratory distress. She presented to the ED on 3/17 with complaints of dyspnea, and lower extremity edema. History is limited due to language / culture barriers, but she has a PMHx of a known goiter that has been present for years, as well as possibly hypertension. It is not clear if the goiter was ever worked up or if she ever was on regular therapy for her hypertension. Her Nephew and son (who speaks little [place]) report that she been having a cough for a few days as well as some fevers and chills. Had aspiration event after trach change and was placed back on cuffed trach with vent, now out to SDU.   SUBJECTIVE:   Tolerating ATC since 4/13 early AM, still with moderate secretions white/tan   VITAL SIGNS: Temp:  [98.5 F (36.9 C)-99.3 F (37.4 C)] 99 F (37.2 C) (04/17 1133) Pulse Rate:  [66-90] 72 (04/17 1133) Resp:  [18-49] 34 (04/17 1133) BP: (105-173)/(47-92) 124/57 mmHg (04/17 1133) SpO2:  [91 %-100 %] 94 % (04/17 1133) FiO2 (%):  [28 %] 28 % (04/17 0713) Weight:  [96 kg (211 lb 10.3 oz)] 96 kg (211 lb 10.3 oz) (04/17 0432) HEMODYNAMICS:   VENTILATOR SETTINGS: Vent Mode:  [-]  FiO2 (%):  [28 %] 28 %  INTAKE / OUTPUT: Intake/Output      04/16 0701 - 04/17 0700 04/17 0701 - 04/18 0700   I.V. (mL/kg) 349 (3.6)    NG/GT 2206.8    IV Piggyback 250    Total Intake(mL/kg) 2805.8 (29.2)    Urine (mL/kg/hr) 850 (0.4) 500 (1.1)   Total Output 850 500   Net +1955.8 -500        Urine Occurrence 3 x     PHYSICAL EXAMINATION: General: comfortable.  Trach in place on tach collar.  Obese woman Integument:  Warm & dry. No rash on exposed skin. HEENT:  Trach in place, poor dentition.  Cardiovascular:  s1 s2  RRR Pulmonary:  Some coarse breath sounds in lower lobes Abdomen: Soft, Normal bowel sounds. Nondistended. PEG. Some facial grimace with abd palpation Neurological: Spontaneously moving all 4 extremities. Eyes open. Appears grossly nonfocal. Less agitated today, follows simple gestures ie. take a big breath with prompting  LABS:  CBC  Recent Labs Lab 03/23/16 0450 03/24/16 0943 03/25/16 1116  WBC 8.7 22.4* 10.1  HGB 9.6* 9.6* 10.1*  HCT 33.7* 29.6* 34.0*  PLT 314 157 308   Coag's No results for input(s): APTT, INR in the last 168 hours. BMET  Recent Labs Lab 03/23/16 0450 03/24/16 0943 03/25/16 0856  NA 145 136 138  K 3.5 4.7 3.3*  CL 100* 100* 90*  CO2 35* 24 37*  BUN 32* 110* 31*  CREATININE 0.79 5.27* 0.81  GLUCOSE 136* 105* 142*   Electrolytes  Recent Labs Lab 03/23/16 0450  03/24/16 0943 03/25/16 0505 03/25/16 0856 03/26/16 0552  CALCIUM 8.4*  --  8.2*  --  8.5*  --   MG 1.8  < >  --  1.6* 1.7 2.9*  < > = values in this interval not displayed. Sepsis Markers No results for input(s): LATICACIDVEN, PROCALCITON, O2SATVEN in the last 168 hours. ABG No results for input(s): PHART, PCO2ART, PO2ART in the last 168  hours. Liver Enzymes  Recent Labs Lab 03/24/16 0943 03/25/16 0856  AST 15 32  ALT 16 23  ALKPHOS 75 83  BILITOT 0.4 0.7  ALBUMIN 1.7* 2.3*   Cardiac Enzymes No results for input(s): TROPONINI, PROBNP in the last 168 hours. Glucose  Recent Labs Lab 03/25/16 1205 03/25/16 1532 03/25/16 1944 03/25/16 2357 03/26/16 0420 03/26/16 0721  GLUCAP 180* 135* 180* 146* 119* 136*   Imaging Dg Abd Portable 1v  03/25/2016  CLINICAL DATA:  Abdominal pain. EXAM: PORTABLE ABDOMEN - 1 VIEW COMPARISON:  Previous examinations. FINDINGS: Normal bowel gas pattern. The recently placed gastrostomy tube tip is now overlying the left mid abdomen. Mild to moderate scoliosis. IMPRESSION: No acute abnormality. Electronically Signed   By: Beckie SaltsSteven  Reid M.D.    On: 03/25/2016 13:54   SIGNIFICANT EVENTS: 3/17 - Admit 3/21 - Thyroidectomy 3/24 - Extubation & reintubation within 2 hours due to hypoxia & secretions 4/6 -  24 hr trach collar 4/7- Trach change by ENT to 6 uncuffed proximal XLT 4/8 - Transferred back to ICU for resp distress/asp pna. Trach changed to #6 cuffed XLT at bedside.  4/12 - Trach collar 4/17 - Plan to change trach to 6 cuffless  STUDIES:  CT-A of Chest >> mild mediastinal enlargement CT of the Neck with contrast >> multinodular goiter, meningioma Port CXR 3/22:  ETT & CVL in good position. Blunting bilateral costophrenic angles suggestive of effusions. Bilateral hilar fullness & interstitial prominence with low lung volumes.  Port CXR 3/25:. Silhouetting of the left hemidiaphragm. Platelike atelectasis right lower lung. Endotracheal tube in good position. Ort CXR 4/8: incr bibasilar atelectasis 4/12 pCXR: persistent elevation of right hemidiaphragm, bibasilar atelectasis vs infltrate  LINES / TUBES: OETT 7.5 3/17 - 3/24; 7.0 3/24>>>3/31 Trach (ENT) 3/31>>> L Subclavian CVL 3/21>>> L Port 3/21>>> OGT 3/21 - 3/24; 3/24>>> Foley 3/18>>> L Radial Art Line 3/21 - 3/23 R & L Neck Drains 3/21 - ???  MICROBIOLOGY: Tracheal Asp Ctx 3/25>>> ng Blood Ctx x2 3/18:  Negative  MRSA PCR 3/18:  Negative  Trache asp 4/8 > candida   ANTIBIOTICS: Unasyn 3/24>>> Ancef 3/21 - 3/24 Clindamycin 3/21 (only 1 dose) Azithromycin 3/18 - 3/19 Rocephin 3/18 - 3/20  ASSESSMENT / PLAN:   Acute on Chronic Hypercarbic Respiratory Failure Acute Hypoxic Respiratory Failure - Aspiration event with concern for aspiration PNA Extrinsic Airway Compression - Secondary to goiter. S/P total thyroidectomy 3/21. Elevated Right Hemidiaphragm  Dyspahgia  -Continue ATC -Advance TF, SLP following -Continue cuffed trach for now > plans to change to uncuffed #6 XLT today. Would not downsize to due secretions. -If tolerates trach change can re-assess  for PMV trials -Unasyn for aspiration stop 4/18  Acute metabolic encephalopathy Concern R sided Bias P:   -clonazepam, 0.5 bid -Seroquel 50 bid -Fentanyl patch 100 mcg  -Consider CT head to eval for stroke if neurologic deficit better appreciated with decreased sedation. -Continue PT/OT care  HTN - TTE 3/19 nml LV fn, gr 1 DD Hypernatremia - improving -Per primary  Joneen RoachPaul Hoffman, AGACNP-BC [facility]mon[facility]/Critical Care Pager [phone] or [phone]  03/26/2016 12:08 PM

## 2016-03-26 NOTE — Procedures (Signed)
Tracheostomy Change Note  Patient Details:   Name: Kendra Osborne DOB: 12/25/1945 MRN: [id number]    Airway Documentation:     Evaluation  O2 sats: stable throughout Complications: No apparent complications Patient did tolerate procedure well. Bilateral Breath Sounds: Expiratory wheezes Suctioning: Airway   Positive color change with the EZ cap CO2 detector, sat 99%.  Rayburn FeltJean S Yarden Hillis 03/26/2016, 12:55 PM

## 2016-03-26 NOTE — Progress Notes (Signed)
Speech Language Pathology Treatment: Kendra BowPassy Osborne Speaking valve  Patient Details Name: Kendra Osborne MRN: [id number] DOB: 07/03/1946 Today's Date: 03/26/2016 Time: 1030-1055 SLP Time Calculation (min) (ACUTE ONLY): 25 min  Assessment / Plan / Recommendation Clinical Impression  Son present for treatment session today, assisting with translation.  Pt continues to have difficulty tolerating PMV.  Uses for short intervals, and achieved low volume/hoarse phonation today, but removal was necessary due to excessive coughing and production of thick secretions.  RN provided tracheal suction.  Intermittent air trapping noted.  RR increased to mid-high 30s; SP02 remained 95% and above.   Educated son re: function of valve and showed him video of valve/trach function per PMV website.  Plan today is to change trach to #6 cuffless, which may allow for improved airflow around trach cannula.  Secretions may remain prohibitive, however.  SLP will follow for valve toleration and readiness for swallow study.  Plan discussed with pt's son and Dr. Joseph ArtWoods.   HPI HPI: 70 y.o. woman who recently moved from [name] ([place] known PMHx) who presented to ED with respiratory distress and lower extremity edema. Pt was intubated 3/17 and failed to wean, therefore trach placed. CXR 3/31 stable bibasilar atelectasis.       SLP Plan  Continue with current plan of care     Recommendations          PMV trials with SLP only.      Plan: Continue with current plan of care     GO              Kendra Osborne L. Samson Fredericouture, [place] CCC/SLP Pager [phone]   Kendra MountsCouture, Kendra Osborne 03/26/2016, 12:10 PM

## 2016-03-26 NOTE — Progress Notes (Signed)
Pt's foley was leaking around. RN changed the foley. Will cont.to monitor pt.

## 2016-03-26 NOTE — Progress Notes (Signed)
[place] TEAM 1 - Stepdown/ICU TEAM Progress Note  Alianis Trimmer ZOX:[id number] DOB: Sep 05, 1946 DOA: 02/24/2016 PCP: No PCP Per Patient  Admit HPI / Brief Narrative: 70 y/o woman recently moved from [place] (speaks Urdu) who presents with respiratory distress. She presented to the ED on 3/17 with complaints of dyspnea, and lower extremity edema. History is limited due to language / culture barriers, but she has a PMHx of a known goiter that has been present for years, as well as possibly hypertension. It is not clear if the goiter was ever worked up or if she ever was on regular therapy for her hypertension. Her Nephew and son (who speaks little [place]) report that she been having a cough for a few days as well as some fevers and chills.   HPI/Subjective: 4/17 alert, calm however still restrained. Indicates negative pain.     Assessment/Plan: Acute on Chronic respiratory failure with hypercapnia/CAP + Aspiration pneumonia 4/7 -Multifactorial to include element of pulmonary edema vs viral bronchitis/pneumonitis,extrinsic airway compression, and iatrogenic (oversedation). -Seroquel 50 mg  BID, -Fentanyl patch 100 g titrate these medications down -PRN morphine for RLQ abdominal pain.  -Minimize all pain medication, benzodiazepines, psychogenic medication. patient extremely sensitive -Have spoken to NP Florentina Addison Will for ICU #6 cuffed trach and placed back on mechanical ventilation -Continue antibiotics stop date per [facility] 4/18 -4/16 spoke with Kendal Hymen from speech and she informed me that they had attempted to use [name] valve on patient's current #6 XLT cuffed trach and patient was unable to tolerate.  -4/17 trach team changed out the #6 XLT cuffed trach to a #6 XLT uncuffed morning of 4/17. Unable to downsize to #4 secondary to copious secretions. Swallow study planned for a.m.  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  Altered mental  status -See acute on chronic respiratory   HTN -Patient relatively hypotensive hold all scheduled BP medication -Hydralazine IV PRN,   Chronic diastolic CHF -Strict in and out since admission -3.8 L -Daily weight Filed Weights   03/24/16 0400 03/25/16 0338 03/26/16 0432  Weight: 96 kg (211 lb 10.3 oz) 93.9 kg (207 lb 0.2 oz) 96 kg (211 lb 10.3 oz)     Acute Renal Failure unspecified renal type - Resolved.  Dyspahgia -Swallow study for 4/18 pending  Postoperative Hypothyroidism -S/P Total Thyroidectomy - Previously normal TSH. -Synthroid  50 g IV - Recheck TSH in 6 weeks  Hypomagnesemia -Magnesium goal> 2  Hypokalemia -Potassium goal> 4 -Potassium syrup 40 mEq  Abdominal pain -KUB negative, most likely secondary to PEG tube, moderate amount stool. -Start minimal amount of morphine PRN -Mag citrate PRN    Code Status: FULL Family Communication: No family available Disposition Plan: CIR vs SNF once more stable    Consultants: Dr.Mitchell Gore ENT Dr.Arthur Hoss IR Dr.Daniel Daneil Dan PCCM   Procedure/Significant Events: 3/18 CTA of Chest >> mild mediastinal enlargement 3/18 CT soft tissue Neck with contrast >> multinodular goiter, meningioma 3/19 echocardiogram;- Left ventricle: moderate LVH. -LVEF=60%-65%. -(grade 1 diastolic dysfunction). - Left atrium:  moderately dilated. - Systemic veins: IVC measured 2.3 cm with < 50% respirophasic  variation, suggesting RA pressure 15 mmHg. 3/21 - Thyroidectomy PCXR 3/22: ETT & CVL in good position. Blunting bilateral costophrenic angles suggestive of effusions. Bilateral hilar fullness & interstitial prominence with low lung volumes.  3/24 - Extubation & reintubation within 2 hours due to hypoxia & secretions PCXR 3/25:. Silhouetting of the left hemidiaphragm. Platelike atelectasis right lower lung. Endotracheal tube  in good position 4/6- 24 hr trach collar 4/6.IR GASTROSTOMY TUBE placed 4/7tracheostomy tube  change ;old 6 cuffed proximal XLT shiley was removed with the old silk sutures, and a new 6 cuffless proximal XLT shiley was placed  4/8 - transferred back to icu for resp distress/asp pna. Trache changed to #6 cuffed XLT at bedside. CXR 4/8: incr bibasilar atelectasis 4/12 pCXR: persistent elevation of right hemidiaphragm, bibasilar atelectasis vs infltrate 4/12 trach collar 4/17 #6 cuffed XLT change to #6 cuffless XLT    Culture Tracheal Asp Ctx 3/25>>> ng Blood Ctx x2 3/18: Negative  MRSA PCR 3/18: Negative  Trache asp 4/8 > candida   Antibiotics: Unasyn 3/24>>>4/3; 4/8>>stop date per PCCM 4/18 Ancef 3/21 - 3/24 Clindamycin 3/21 (only 1 dose) Azithromycin 3/18 - 3/19 Rocephin 3/18 - 3/20    DVT prophylaxis: SCD   Devices    LINES / TUBES:  OETT 7.5 3/17 - 3/24; 7.0 3/24>>>3/31 Trach (ENT) 3/31>>> L Subclavian CVL 3/21>>> L Port 3/21>>> OGT 3/21 - 3/24; 3/24>>> Foley 3/18>>> PIV x1 L Radial Art Line 3/21 - 3/23 R & L Neck Drains 3/21 - ???     Continuous Infusions: . sodium chloride 10 mL/hr at 03/24/16 1351  . feeding supplement (VITAL HIGH PROTEIN) 1,000 mL (03/25/16 1842)    Objective: VITAL SIGNS: Temp: 98.6 F (37 C) (04/17 0729) Temp Source: Oral (04/17 0729) BP: 140/75 mmHg (04/17 0729) Pulse Rate: 79 (04/17 0729) SPO2; FIO2:   Intake/Output Summary (Last 24 hours) at 03/26/16 0826 Last data filed at 03/26/16 [id number]  Gross per 24 hour  Intake 2805.75 ml  Output    850 ml  Net 1955.75 ml     Exam: General: Alert Comfortable, still does not have San[employer]-SCIPassey [name] valve, positive chronic respiratory distress  Eyes: Negative headache, negative scleral hemorrhage ENT: Negative Runny nose, negative gingival bleeding,  Neck:  Negative scars, masses, torticollis, lymphadenopathy, JVD, trach collar in place negative sign of infection Lungs: clear to ossification bilateral, Positive wheezes, negative crackles, discharge from  trachea Cardiovascular: Regular rhythm and rate, without murmur gallop or rub normal S1 and S2 Abdomen: Morbidly obese, negative abdominal pain, nondistended, positive soft, bowel sounds, no rebound, no ascites, no appreciable mass, PEG tube in place Extremities: No significant cyanosis, clubbing, or edema bilateral lower extremities Psychiatric:  Calm, allows physical evaluation.  Neurologic:  Moves all extremities spontaneously, follows some commands   Data Reviewed: Basic Metabolic Panel:  Recent Labs Lab 03/21/16 0330  03/22/16 0845 03/23/16 0450 03/24/16 0418 03/24/16 0943 03/25/16 0505 03/25/16 0856 03/26/16 0552  NA 151*  --  147* 145  --  136  --  138  --   K 3.5  --  3.8 3.5  --  4.7  --  3.3*  --   CL 109  --  104 100*  --  100*  --  90*  --   CO2 32  --  29 35*  --  24  --  37*  --   GLUCOSE 152*  --  173* 136*  --  105*  --  142*  --   BUN 36*  --  35* 32*  --  110*  --  31*  --   CREATININE 0.80  --  0.74 0.79  --  5.27*  --  0.81  --   CALCIUM 8.5*  --  8.4* 8.4*  --  8.2*  --  8.5*  --   MG 1.9  < >  --  1.8 1.8  --  1.6* 1.7 2.9*  < > = values in this interval not displayed. Liver Function Tests:  Recent Labs Lab 03/24/16 0943 03/25/16 0856  AST 15 32  ALT 16 23  ALKPHOS 75 83  BILITOT 0.4 0.7  PROT 5.5* 7.7  ALBUMIN 1.7* 2.3*   No results for input(s): LIPASE, AMYLASE in the last 168 hours. No results for input(s): AMMONIA in the last 168 hours. CBC:  Recent Labs Lab 03/20/16 0350 03/23/16 0450 03/24/16 0943 03/25/16 1116  WBC 7.7 8.7 22.4* 10.1  NEUTROABS  --   --  19.1* 7.0  HGB 9.5* 9.6* 9.6* 10.1*  HCT 32.8* 33.7* 29.6* 34.0*  MCV 87.9 89.2 93.1 86.3  PLT 338 314 157 308   Cardiac Enzymes: No results for input(s): CKTOTAL, CKMB, CKMBINDEX, TROPONINI in the last 168 hours. BNP (last 3 results)  Recent Labs  02/24/16 2000  BNP 983.4*    ProBNP (last 3 results) No results for input(s): PROBNP in the last 8760  hours.  CBG:  Recent Labs Lab 03/25/16 1532 03/25/16 1944 03/25/16 2357 03/26/16 0420 03/26/16 0721  GLUCAP 135* 180* 146* 119* 136*    Recent Results (from the past 240 hour(s))  Culture, respiratory (NON-Expectorated)     Status: None   Collection Time: 03/17/16 12:48 PM  Result Value Ref Range Status   Specimen Description TRACHEAL ASPIRATE  Final   Special Requests NONE  Final   Gram Stain   Final    MODERATE WBC PRESENT, PREDOMINANTLY PMN RARE SQUAMOUS EPITHELIAL CELLS PRESENT NO ORGANISMS SEEN Performed at [facility]    Culture   Final    FEW YEAST CONSISTENT WITH CANDIDA SPECIES Performed at [facility]    Report Status 03/20/2016 FINAL  Final     Studies:  Recent x-ray studies have been reviewed in detail by the Attending Physician  Scheduled Meds:  Scheduled Meds: . ampicillin-sulbactam (UNASYN) IV  1.5 g Intravenous Q6H  . bacitracin   Topical TID  . chlorhexidine  15 mL Mouth Rinse BID  . docusate  100 mg Oral BID  . fentaNYL  100 mcg Transdermal Q72H  . free water  300 mL Per Tube Q4H  . furosemide  40 mg Intravenous Q12H  . heparin subcutaneous  5,000 Units Subcutaneous 3 times per day  . levothyroxine  50 mcg Intravenous QAC breakfast  . pantoprazole sodium  40 mg Per Tube Daily  . polyethylene glycol  17 g Oral Daily  . QUEtiapine  50 mg Oral BID  . sodium chloride flush  10-40 mL Intracatheter Q12H    Time spent on care of this patient: 40 mins   Megan Hayduk, Roselind Messier , MD  [facility] Office  [phone] Pager - [phone]  On-Call/Text Page:      [email or web address]      password [email or web address]  If 7PM-7AM, please contact night-coverage [email or web address] Password [email or web address] 03/26/2016, 8:26 AM   LOS: 30 days   Care during the described time interval was provided by me .  I have reviewed this patient's available data, including medical history, events of note, physical examination, and all test results as part of my evaluation. I  have personally reviewed and interpreted all radiology studies.   Carolyne Littles, MD [phone] Pager

## 2016-03-26 NOTE — Progress Notes (Signed)
   03/26/16 1600  Clinical Encounter Type  Visited With Patient  Visit Type Spiritual support  Chaplain observed that patient had been here 30 days and thought she might need encouragement. Upon visiting, found that she speaks only Urdu and has a trach, so she can nod yes or no. Chaplain contacted speech pathologist, who advised that patient probably does need support and said that she had left the phone number for the translator in the room. Chaplain returned with prayer shawl, demonstrated its use, and ascertained via language line that patient is in fact Muslim but could not determine much more than that. It is apparently hoped that patient will soon be able to speak, so chaplain will return and try again at that point to visit. Melvia Matousek, Chaplain

## 2016-03-26 NOTE — Progress Notes (Signed)
Nutrition Follow-up  DOCUMENTATION CODES:   Morbid obesity  INTERVENTION:    Continue Vital High Protein formula at goal rate of 55 ml/hr  Above TF regimen providing 1320 kcals, 116 gm protein, 1104 ml free water daily  NUTRITION DIAGNOSIS:   Inadequate oral intake related to inability to eat as evidenced by NPO status, ongoing  GOAL:   Provide needs based on ASPEN/SCCM guidelines, met  MONITOR:   TF tolerance, Labs, Weight trends, I & O's  ASSESSMENT:   70 y/o woman recently moved from [place] who presents with respiratory distress. 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  S/p trach 3/31 and PEG 4/6. Transferred out of [facility] 4/13.  Patient on ATC - s/p trach change today. Currently receiving Vital High Protein formula at 55 ml/hr via PEG tube which is providing 1320 kcals, 116 gm protein, 1104 ml free water daily.  Free water flushes at 300 ml every 4 hours. Speech Path following for PMSV treatments.  Diet Order:   NPO  Skin:  Wound (see comment) (closed incision on neck)  Last BM:  4/14  Height:   Ht Readings from Last 1 Encounters:  03/22/16 5' (1.524 m)    Weight:   Wt Readings from Last 1 Encounters:  03/26/16 211 lb 10.3 oz (96 kg)    Ideal Body Weight:  45.45 kg  BMI:  Body mass index is 41.33 kg/(m^2).  Estimated Nutritional Needs:   Kcal:  1500-1600  Protein:  100-115 gm  Fluid:  >/= 1.5 L  EDUCATION NEEDS:   No education needs identified at this time  Arthur Holms, RD, LDN Pager #: [phone] After-Hours Pager #: [phone]

## 2016-03-26 NOTE — Progress Notes (Signed)
Pharmacy Antibiotic Note  Juliane Pootaseem Pricer is a 70 y.o. female admitted on 02/24/2016 with respiratory distress.  Pharmacy has been consulted for Unasyn dosing for aspiration PNA.  Patient's renal function has been stable (4/15 lab is likely an error).  Plan: - Continue Unasyn 1.5gm IV Q6H through 03/27/16 per CCM - Pharmacy will sign off as dosage adjustment is likely unnecessary until end date.  Thank you for the consult! - Consider discontinuing free water as Na+ has normalized.   Height: 5' (152.4 cm) Weight: 211 lb 10.3 oz (96 kg) IBW/kg (Calculated) : 45.5  Temp (24hrs), Avg:98.8 F (37.1 C), Min:98.5 F (36.9 C), Max:99.3 F (37.4 C)   Recent Labs Lab 03/20/16 0350 03/21/16 0330 03/22/16 0845 03/23/16 0450 03/24/16 0943 03/25/16 0856 03/25/16 1116  WBC 7.7  --   --  8.7 22.4*  --  10.1  CREATININE 0.93 0.80 0.74 0.79 5.27* 0.81  --     Estimated Creatinine Clearance: 68.9 mL/min (by C-G formula based on Cr of 0.81).    Not on File  Antimicrobials this admission: Unasyn 3/24>>4/3, resumed 4/8 >> [CCM rec through 4/18] Ancef 3/21 - 3/24 Clinda 3/21 (only 1 dose) Azith 3/18 - 3/19 Rocephin 3/18 - 3/20  Dose adjustments this admission: N/A  Microbiology results: Tracheal Asp Ctx 3/25>>> ng BCx x2 3/18:  Negative   MRSA PCR 3/18:  Negative  Resp cx 4/8 >> normal flora   Gilad Dugger D. Laney Potashang, PharmD, BCPS Pager:  [phone] 03/26/2016, 9:14 AM

## 2016-03-27 ENCOUNTER — Inpatient Hospital Stay (HOSPITAL_COMMUNITY): Payer: Medicaid Other

## 2016-03-27 DIAGNOSIS — R109 Unspecified abdominal pain: Secondary | ICD-10-CM | POA: Insufficient documentation

## 2016-03-27 LAB — CBC
HEMATOCRIT: 29.8 % — AB (ref 36.0–46.0)
HEMOGLOBIN: 9.1 g/dL — AB (ref 12.0–15.0)
MCH: 27 pg (ref 26.0–34.0)
MCHC: 30.5 g/dL (ref 30.0–36.0)
MCV: 88.4 fL (ref 78.0–100.0)
PLATELETS: 268 10*3/uL (ref 150–400)
RBC: 3.37 MIL/uL — ABNORMAL LOW (ref 3.87–5.11)
RDW: 19.6 % — ABNORMAL HIGH (ref 11.5–15.5)
WBC: 12 10*3/uL — AB (ref 4.0–10.5)

## 2016-03-27 LAB — BASIC METABOLIC PANEL
Anion gap: 11 (ref 5–15)
BUN: 41 mg/dL — AB (ref 6–20)
CHLORIDE: 87 mmol/L — AB (ref 101–111)
CO2: 39 mmol/L — AB (ref 22–32)
CREATININE: 0.84 mg/dL (ref 0.44–1.00)
Calcium: 8.1 mg/dL — ABNORMAL LOW (ref 8.9–10.3)
GFR calc Af Amer: >60 mL/min (ref >60)
GFR calc non Af Amer: >60 mL/min (ref >60)
Glucose, Bld: 129 mg/dL — ABNORMAL HIGH (ref 65–99)
Potassium: 3.5 mmol/L (ref 3.5–5.1)
Sodium: 137 mmol/L (ref 135–145)

## 2016-03-27 LAB — GLUCOSE, CAPILLARY
GLUCOSE-CAPILLARY: 111 mg/dL — AB (ref 65–99)
GLUCOSE-CAPILLARY: 126 mg/dL — AB (ref 65–99)
GLUCOSE-CAPILLARY: 129 mg/dL — AB (ref 65–99)
Glucose-Capillary: 149 mg/dL — ABNORMAL HIGH (ref 65–99)
Glucose-Capillary: 144 mg/dL — ABNORMAL HIGH (ref 65–99)
Glucose-Capillary: 139 mg/dL — ABNORMAL HIGH (ref 65–99)

## 2016-03-27 LAB — MAGNESIUM
Magnesium: 2.4 mg/dL (ref 1.7–2.4)
Magnesium: 2.5 mg/dL — ABNORMAL HIGH (ref 1.7–2.4)

## 2016-03-27 MED ORDER — SIMETHICONE 80 MG PO CHEW
160.0000 mg | CHEWABLE_TABLET | Freq: Four times a day (QID) | ORAL | Status: DC | PRN
  Administered 2016-03-29 – 2016-04-02 (×3): 160 mg via ORAL
  Filled 2016-03-27 (×4): qty 2

## 2016-03-27 MED ORDER — ATROPINE SULFATE 1 % OP SOLN
2.0000 [drp] | Freq: Four times a day (QID) | OPHTHALMIC | Status: DC
  Administered 2016-03-27 – 2016-03-28 (×2): 2 [drp] via SUBLINGUAL
  Filled 2016-03-27: qty 2

## 2016-03-27 NOTE — Progress Notes (Signed)
MBSS complete. Full report located under chart review in imaging section.  Jenafer Winterton Paiewonsky, M.A. CCC-SLP [phone]  

## 2016-03-27 NOTE — Progress Notes (Signed)
Speech Language Pathology Treatment: Kendra Osborne Speaking valve  Patient Details Name: Kendra Osborne MRN: [id number] DOB: 01/09/1946 Today's Date: 03/27/2016 Time: [id number] SLP Time Calculation (min) (ACUTE ONLY): 35 min  Assessment / Plan / Recommendation Clinical Impression  Pt has improved tolerance of PMSV today with new #6 cuffless trach. Valve was placed for 30 minutes under direct SLP supervision with VS stable. No evidence of air trapping until ~25 minutes into trial, at which time small burst of air noted. She is able to voice with rough vocal quality and low vocal intensity. Her son, who assisted with translating, provided cueing to count 1-8. He used pointed questions to ask about orientation, so unclear what her orientation level is at this time. Recommend to use the speaking valve intermittently with full supervision from staff only (son not yet trained in its use).   HPI HPI: 70 y.o. woman who recently moved from [name] (no known PMHx) who presented to ED with respiratory distress and lower extremity edema. Pt was intubated 3/17 and failed to wean, therefore trach placed. CXR 3/31 stable bibasilar atelectasis.       SLP Plan  MBS     Recommendations         Patient may use Passy-Osborne Speech Valve: Intermittently with supervision PMSV Supervision: Full MD: Please consider changing trach tube to : Smaller size      Oral Care Recommendations: Oral care QID Follow up Recommendations: LTACH Plan: MBS     GO               Maxcine HamLaura Paiewonsky, M.A. CCC-SLP [phone]  Maxcine Hamaiewonsky, Pat Sires 03/27/2016, 1:33 PM

## 2016-03-27 NOTE — Evaluation (Signed)
Clinical/Bedside Swallow Evaluation Patient Details  Name: Kendra Osborne MRN: [id number] Date of Birth: 03/18/1946  Today's Date: 03/27/2016 Time: SLP Start Time (ACUTE ONLY): 1007 SLP Stop Time (ACUTE ONLY): 1042 SLP Time Calculation (min) (ACUTE ONLY): 35 min  Past Medical History:  Past Medical History  Diagnosis Date  . Hypertension   . Chronic diastolic CHF (congestive heart failure) [facility](HCC)    Past Surgical History:  Past Surgical History  Procedure Laterality Date  . Thyroidectomy N/A 02/28/2016    Procedure:  TOTAL THYROIDECTOMY WITH NIMS MONITOR;  Surgeon: Melvenia BeamMitchell Gore, MD;  [facility];  Service: ENT;  Laterality: N/A;  . Tracheostomy tube placement N/A 03/09/2016    Procedure: TRACHEOSTOMY;  Surgeon: Melvenia BeamMitchell Gore, MD;  [facility];  Service: ENT;  Laterality: N/A;   HPI:  70 y.o. woman who recently moved from [name] (no known PMHx) who presented to ED with respiratory distress and lower extremity edema. Pt was intubated 3/17 and failed to wean, therefore trach placed. CXR 3/31 stable bibasilar atelectasis.    Assessment / Plan / Recommendation Clinical Impression  Pt has reduced labial closure and lingual movement for oral manipulation of ice chip boluses, but ultimately swallows them without overt s/s of aspiration; however, given her hospital course thus far, would recommend proceeding with MBS this afternoon to determine readiness for PO diet.    Aspiration Risk  Severe aspiration risk    Diet Recommendation NPO   Medication Administration: Via alternative means    Other  Recommendations Oral Care Recommendations: Oral care QID   Follow up Recommendations  LTACH    Frequency and Duration            Prognosis Prognosis for Safe Diet Advancement: Fair Barriers to Reach Goals: Time post onset;Severity of deficits      Swallow Study   General HPI: 70 y.o. woman who recently moved from [name] (no known PMHx) who presented to ED with respiratory  distress and lower extremity edema. Pt was intubated 3/17 and failed to wean, therefore trach placed. CXR 3/31 stable bibasilar atelectasis.  Type of Study: Bedside Swallow Evaluation Previous Swallow Assessment: BSE 4/5 recommending NPO Diet Prior to this Study: NPO Temperature Spikes Noted: No Respiratory Status: Trach Collar;Trach Trach Size and Type: #6;Uncuffed;With PMSV in place History of Recent Intubation: Yes Behavior/Cognition: Alert;Cooperative;Requires cueing Oral Cavity Assessment: Within Functional Limits Oral Care Completed by SLP: No Oral Cavity - Dentition: Poor condition;Missing dentition Self-Feeding Abilities: Needs assist Patient Positioning: Upright in chair Baseline Vocal Quality: Hoarse;Low vocal intensity Volitional Cough: Weak;Congested    Oral/Motor/Sensory Function Overall Oral Motor/Sensory Function: Within functional limits   Ice Chips Ice chips: Impaired Presentation: Spoon Oral Phase Impairments: Reduced lingual movement/coordination;Reduced labial seal Oral Phase Functional Implications: Prolonged oral transit Pharyngeal Phase Impairments: Suspected delayed Swallow;Decreased hyoid-laryngeal movement   Thin Liquid Thin Liquid: Not tested    Nectar Thick Nectar Thick Liquid: Not tested   Honey Thick Honey Thick Liquid: Not tested   Puree Puree: Not tested   Solid   GO   Solid: Not tested       Maxcine HamLaura Paiewonsky, M.A. CCC-SLP [phone]  Maxcine Hamaiewonsky, Emileo Semel 03/27/2016,2:14 PM

## 2016-03-27 NOTE — Progress Notes (Signed)
Kendra Osborne, Kendra Osborne DOA: 02/24/2016 PCP: No PCP Per Patient  Admit HPI / Brief Narrative: 71 y/o woman recently moved from [place] (speaks Urdu) who presents with respiratory distress. She presented to the ED on 3/17 with complaints of dyspnea, and lower extremity edema. History is limited due to language / culture barriers, but she has a PMHx of a known goiter that has been present for years, as well as possibly hypertension. It is not clear if the goiter was ever worked up or if she ever was on regular therapy for her hypertension. Her Nephew and son (who speaks little [place]) report that she been having a cough for a few days as well as some fevers and chills.   HPI/Subjective: 4/18 A/O 3 (does not know why). Son translated for [place]. Patient tolerating PMV. Negative pain, negative N/V.     Assessment/Plan: Acute on Chronic respiratory failure with hypercapnia/CAP + Aspiration pneumonia 4/7 -Multifactorial to include element of pulmonary edema vs viral bronchitis/pneumonitis,extrinsic airway compression, and iatrogenic (oversedation). -Seroquel 50 mg  BID, -Fentanyl patch 100 g titrate these medications down -PRN morphine for RLQ abdominal pain.  -Minimize all pain medication, benzodiazepines, psychogenic medication. patient extremely sensitive -Have spoken to NP Florentina Addison Will for ICU #6 cuffed trach and placed back on mechanical ventilation -Continue antibiotics stop date per [facility] 4/18 -4/17 trach team changed out the #6 XLT cuffed trach to a #6 XLT uncuffed morning of 4/17. Unable to downsize to #4 secondary to copious secretions. Swallow study planned for a.m. -4/18 patient tolerating PMV, able to converse slowly with son and [place] (son translating). Unfortunately was contacted later in the day patient failed swallow study. 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  Altered mental status -See acute on chronic respiratory   HTN -Patient relatively hypotensive hold all scheduled BP medication -Hydralazine IV PRN,   Chronic diastolic CHF -Strict in and out since admission -2.6 L -Daily weight Filed Weights   [name] 0338 [name] 0432 [name] 0500  Weight: 93.9 kg (207 lb 0.2 oz) 96 kg (211 lb 10.3 oz) 99.1 kg (218 lb 7.6 oz)     Acute Renal Failure unspecified renal type - Resolved.  Dyspahgia -failed Swallow study 4/18  Postoperative Hypothyroidism -S/P Total Thyroidectomy - Previously normal TSH. -Synthroid  50 g IV - Recheck TSH in 6 weeks  Hypomagnesemia -Magnesium goal> 2  Hypokalemia -Potassium goal> 4  Abdominal pain -KUB negative, most likely secondary to PEG tube, moderate amount stool. -Start minimal amount of morphine PRN -Mag citrate PRN    Code Status: FULL Family Communication:son at bedside helping to translate Disposition Plan: CIR vs home once more stable    Consultants: Dr.Mitchell Gore ENT Dr.Arthur Hoss IR Dr.Daniel Daneil Dan PCCM   Procedure/Significant Events: 3/18 CTA of Chest >> mild mediastinal enlargement 3/18 CT soft tissue Neck with contrast >> multinodular goiter, meningioma 3/19 echocardiogram;- Left ventricle: moderate LVH. -LVEF=60%-65%. -(grade 1 diastolic dysfunction). - Left atrium:  moderately dilated. - Systemic veins: IVC measured 2.3 cm with < 50% respirophasic  variation, suggesting RA pressure 15 mmHg. 3/21 - Thyroidectomy PCXR 3/22: ETT & CVL in good position. Blunting bilateral costophrenic angles suggestive of effusions. Bilateral hilar fullness & interstitial prominence with low lung volumes.  3/24 - Extubation & reintubation within 2 hours due to hypoxia & secretions PCXR 3/25:. Silhouetting  of the left hemidiaphragm. Platelike atelectasis right lower lung. Endotracheal tube in good position 4/6- 24 hr  trach collar 4/6.IR GASTROSTOMY TUBE placed 4/7tracheostomy tube change ;old 6 cuffed proximal XLT shiley was removed with the old silk sutures, and a new 6 cuffless proximal XLT shiley was placed  4/8 - transferred back to icu for resp distress/asp pna. Trache changed to #6 cuffed XLT at bedside. CXR 4/8: incr bibasilar atelectasis 4/12 pCXR: persistent elevation of right hemidiaphragm, bibasilar atelectasis vs infltrate 4/12 trach collar 4/17 #6 cuffed XLT change to #6 cuffless XLT    Culture Tracheal Asp Ctx 3/25>>> ng Blood Ctx x2 3/18: Negative  MRSA PCR 3/18: Negative  Trache asp 4/8 > candida   Antibiotics: Unasyn 3/24>>>4/3; 4/8>>stop date per PCCM 4/18 Ancef 3/21 - 3/24 Clindamycin 3/21 (only 1 dose) Azithromycin 3/18 - 3/19 Rocephin 3/18 - 3/20    DVT prophylaxis: SCD   Devices    LINES / TUBES:  OETT 7.5 3/17 - 3/24; 7.0 3/24>>>3/31 Trach (ENT) 3/31>>> L Subclavian CVL 3/21>>> L Port 3/21>>> OGT 3/21 - 3/24; 3/24>>> Foley 3/18>>> PIV x1 L Radial Art Line 3/21 - 3/23 R & L Neck Drains 3/21 - ???     Continuous Infusions: . sodium chloride 10 mL/hr at [name]  . feeding supplement (VITAL HIGH PROTEIN) 1,000 mL ([name])    Objective: VITAL SIGNS: Temp: 98 F (36.7 C) ([name]) Temp Source: Axillary ([name]) BP: 112/60 mmHg (Osborne/18 0812) Pulse Rate: 73 (Osborne/18 0812) SPO2; FIO2:   Intake/Output Summary (Last 24 hours) at [name] Last data filed at [name] 0800  Gross per 24 hour  Intake    615 ml  Output   1300 ml  Net   -685 ml     Exam: General: Alert Comfortable, tolerating [name] valve, positive chronic respiratory distress (improving) Eyes: Negative headache, negative scleral hemorrhage ENT: Negative Runny nose, negative gingival bleeding,  Neck:  Negative scars, masses, torticollis, lymphadenopathy, JVD, trach collar in place negative sign of infection Lungs: clear to ossification bilateral, Positive  very mildwheezes, negative crackles, negative discharge from trachea Cardiovascular: Regular rhythm and rate, without murmur gallop or rub normal S1 and S2 Abdomen: Morbidly obese, negative abdominal pain, nondistended, positive soft, bowel sounds, no rebound, no ascites, no appreciable mass, PEG tube in place Extremities: No significant cyanosis, clubbing, or edema bilateral lower extremities Psychiatric:  Calm, allows physical evaluation.  Neurologic:  Moves all extremities spontaneously, follows all commands when translated through son   Data Reviewed: Basic Metabolic Panel:  Recent Labs Lab Osborne/13/17 0845 Osborne/14/17 0450 Osborne/15/17 0418 Osborne/15/17 [id number] [name] 0505 [name] 0856 [name] 0552 [name] 1248 [name] 0520  NA 147* 145  --  136  --  138  --  138  --   K 3.8 3.5  --  4.7  --  3.3*  --  3.2*  --   CL 104 100*  --  100*  --  90*  --  89*  --   CO2 29 35*  --  24  --  37*  --  40*  --   GLUCOSE 173* 136*  --  105*  --  142*  --  143*  --   BUN 35* 32*  --  110*  --  31*  --  35*  --   CREATININE 0.74 0.79  --  5.27*  --  0.81  --  0.85  --   CALCIUM 8.4* 8.4*  --  8.2*  --  8.5*  --  8.2*  --   MG  --  1.8 1.8  --  1.6* 1.7 2.9*  --  2.4   Liver Function Tests:  Recent Labs Lab Osborne/15/17 0943 [name] 0856  AST 15 32  ALT 16 23  ALKPHOS 75 83  BILITOT 0.4 0.7  PROT 5.5* 7.7  ALBUMIN 1.7* 2.3*   No results for input(s): LIPASE, AMYLASE in the last 168 hours. No results for input(s): AMMONIA in the last 168 hours. CBC:  Recent Labs Lab Osborne/14/17 0450 Osborne/15/17 0943 [name] 1116  WBC 8.7 22.4* 10.1  NEUTROABS  --  19.1* 7.0  HGB 9.6* 9.6* 10.1*  HCT 33.7* 29.6* 34.0*  MCV 89.2 93.1 86.3  PLT 314 157 308   Cardiac Enzymes: No results for input(s): CKTOTAL, CKMB, CKMBINDEX, TROPONINI in the last 168 hours. BNP (last 3 results)  Recent Labs  02/24/16 2000  BNP 983.4*    ProBNP (last 3 results) No results for input(s): PROBNP in the last 8760  hours.  CBG:  Recent Labs Lab [name] 1604 [name] 2011 [name] 0005 [name] 0442 [name] 0814  GLUCAP 154* 147* 129* 149* 144*    Recent Results (from the past 240 hour(s))  Culture, respiratory (NON-Expectorated)     Status: None   Collection Time: Osborne/08/17 12:48 PM  Result Value Ref Range Status   Specimen Description TRACHEAL ASPIRATE  Final   Special Requests NONE  Final   Gram Stain   Final    MODERATE WBC PRESENT, PREDOMINANTLY PMN RARE SQUAMOUS EPITHELIAL CELLS PRESENT NO ORGANISMS SEEN Performed at [facility]    Culture   Final    FEW YEAST CONSISTENT WITH CANDIDA SPECIES Performed at [facility]    Report Status Osborne/10/2016 FINAL  Final     Studies:  Recent x-ray studies have been reviewed in detail by the Attending Physician  Scheduled Meds:  Scheduled Meds: . ampicillin-sulbactam (UNASYN) IV  1.5 g Intravenous Q6H  . bacitracin   Topical TID  . chlorhexidine  15 mL Mouth Rinse BID  . docusate  100 mg Oral BID  . fentaNYL  100 mcg Transdermal Q72H  . free water  300 mL Per Tube Q4H  . furosemide  40 mg Intravenous Q12H  . heparin subcutaneous  5,000 Units Subcutaneous 3 times per day  . levothyroxine  50 mcg Intravenous QAC breakfast  . pantoprazole sodium  40 mg Per Tube Daily  . polyethylene glycol  17 g Oral Daily  . QUEtiapine  50 mg Oral BID  . sodium chloride flush  10-40 mL Intracatheter Q12H    Time spent on care of this patient: 40 mins   Zalia Hautala, Roselind Messier , MD  [facility] Office  [phone] Pager - [phone]  On-Call/Text Page:      [email or web address]      password [email or web address]  If 7PM-7AM, please contact night-coverage [email or web address] Password [email or web address] 03/27/2016, 11:34 AM   LOS: 31 days   Care during the described time interval was provided by me .  I have reviewed this patient's available data, including medical history, events of Osborne, physical examination, and all test results as part of my evaluation. I  have personally reviewed and interpreted all radiology studies.   Carolyne Littles, MD [phone] Pager

## 2016-03-27 NOTE — Progress Notes (Signed)
Occupational Therapy Treatment Patient Details Name: Kendra Osborne MRN: [id number] DOB: 06-14-1946 Today's Date: 03/27/2016    History of present illness pt presents with Large thyroid mass/goiter and Respiratory Difficulties.  pt has underwent Total Thyroidectomy with goiter removal and has remained intubated with one failed extubation on 3/24.  pt recently came to the [place] from [place] and only known medical hx is HTN and Goiter.     OT comments  Discharge recommendations updated to LTACH due to trach dependence and CIR currently signed off on patient. Pt continues to require (A) level higher than CIR admission. Pt non compliant during session for bed mobility and language barrier present due to inability to fully utilize language line.   MD- Please order air mattress overlay due to skin break down. Pt could benefit from repositioning 2Q due to inability to reposition self without (A).   Follow Up Recommendations  LTACH;Supervision/Assistance - 24 hour    Equipment Recommendations  Hospital bed;Wheelchair cushion (measurements OT);Wheelchair (measurements OT)    Recommendations for Other Services Other (comment) (LTACH)    Precautions / Restrictions Precautions Precautions: Fall Precaution Comments: trach collar, peg Restrictions Weight Bearing Restrictions: No       Mobility Bed Mobility Overal bed mobility: Needs Assistance;+2 for physical assistance Bed Mobility: Supine to Sit;Sit to Supine     Supine to sit: +2 for physical assistance;Mod assist;HOB elevated Sit to supine: Total assist;+2 for physical assistance;+2 for safety/equipment;HOB elevated   General bed mobility comments: pt difficult to return to supine. pt pointing to hair and pulling braids out. Ot assisting with hair and pt continued to decline return to supine. pt declined repositioning for peri care. pt required total +3 to complete task due to pushing with BIL UE to prevent staff from assisting  initially  Transfers                 General transfer comment: no appropriate at this time    Balance   Sitting-balance support: Bilateral upper extremity supported;Feet unsupported Sitting balance-Leahy Scale: Poor Sitting balance - Comments: pt demonstrates core activation at eob.                            ADL Overall ADL's : Needs assistance/impaired                                       General ADL Comments: OT called language assist line this AM and unable to have patient follow commands. Operator reports bad connection and line disconnected. Ot used hand gestures to help patient participate. pt following gestures to progress to EOB> pt washing face appropriate. pt with facial grimance with AAROM for UE and fatigued. pt making expression as if breathing is difficult but RR and oxygen saturations remain stable. pt declined to return supine at the end of session. pt required total +3 (A) to shift weight onto R hip. At that time it was apparent patient with incontinence of bowel and total (A) care complete. pt noted to have stage 1 / 2 wounds on sacrum and skin folds. Pt provided foam pink dressing by RN Darien Ramus. Pt total +2 (A) for bed positioning. pt swatting UE at therapist at times during session attempting to keep therapist from assisting patient.       Vision  Perception     Praxis      Cognition   Behavior During Therapy: Flat affect Overall Cognitive Status: Difficult to assess                       Extremity/Trunk Assessment               Exercises     Shoulder Instructions       General Comments      Pertinent Vitals/ Pain       Pain Assessment: Faces Pain Descriptors / Indicators: Grimacing Pain Intervention(s): Monitored during session;Repositioned  Home Living                                          Prior Functioning/Environment              Frequency  Min 2X/week     Progress Toward Goals  OT Goals(current goals can now be found in the care plan section)  Progress towards OT goals: Progressing toward goals  Acute Rehab OT Goals Patient Stated Goal: Unable to state. OT Goal Formulation: Patient unable to participate in goal setting Time For Goal Achievement: 04/04/16 Potential to Achieve Goals: Good ADL Goals Pt Will Perform Grooming: with min assist;standing Pt Will Perform Upper Body Bathing: with supervision;sitting Pt Will Perform Lower Body Bathing: with min assist;sit to/from stand Pt Will Perform Upper Body Dressing: with min assist;sitting Pt Will Perform Lower Body Dressing: with min assist;sit to/from stand Pt Will Transfer to Toilet: with min assist;ambulating;regular height toilet;bedside commode;grab bars Pt Will Perform Toileting - Clothing Manipulation and hygiene: with min assist;sit to/from stand Pt/caregiver will Perform Home Exercise Program: Increased strength;Both right and left upper extremity;With theraband;With Supervision;With written HEP provided  Plan Discharge plan needs to be updated    Co-evaluation                 End of Session Equipment Utilized During Treatment: Oxygen   Activity Tolerance Patient tolerated treatment well   Patient Left in bed;with call bell/phone within reach;with nursing/sitter in room   Nurse Communication Mobility status;Precautions        Time: [id number] OT Time Calculation (min): 39 min  Charges: OT General Charges $OT Visit: 1 Procedure OT Treatments $Self Care/Home Management : 38-52 mins  Boone MasterJones, Kassadi Presswood B 03/27/2016, 11:47 AM  [name], [name]   OTR/L Pager: [phone] Office: [phone] .

## 2016-03-28 LAB — GLUCOSE, CAPILLARY
GLUCOSE-CAPILLARY: 144 mg/dL — AB (ref 65–99)
GLUCOSE-CAPILLARY: 124 mg/dL — AB (ref 65–99)
Glucose-Capillary: 115 mg/dL — ABNORMAL HIGH (ref 65–99)
Glucose-Capillary: 123 mg/dL — ABNORMAL HIGH (ref 65–99)
Glucose-Capillary: 126 mg/dL — ABNORMAL HIGH (ref 65–99)
Glucose-Capillary: 129 mg/dL — ABNORMAL HIGH (ref 65–99)

## 2016-03-28 LAB — MAGNESIUM: MAGNESIUM: 2.3 mg/dL (ref 1.7–2.4)

## 2016-03-28 MED ORDER — FENTANYL 25 MCG/HR TD PT72
50.0000 ug | MEDICATED_PATCH | TRANSDERMAL | Status: DC
  Administered 2016-03-29: 50 ug via TRANSDERMAL
  Filled 2016-03-28: qty 2

## 2016-03-28 MED ORDER — QUETIAPINE FUMARATE 25 MG PO TABS
12.5000 mg | ORAL_TABLET | Freq: Every day | ORAL | Status: DC
  Administered 2016-03-29 – 2016-04-03 (×6): 12.5 mg
  Filled 2016-03-28 (×6): qty 1

## 2016-03-28 MED ORDER — POLYETHYLENE GLYCOL 3350 17 G PO PACK
17.0000 g | PACK | Freq: Every day | ORAL | Status: DC
  Administered 2016-03-29 – 2016-04-08 (×9): 17 g
  Filled 2016-03-28 (×11): qty 1

## 2016-03-28 MED ORDER — ACETAMINOPHEN 160 MG/5ML PO SOLN: 650.0000 mg | Freq: Four times a day (QID) | ORAL | Status: DC | PRN

## 2016-03-28 MED ORDER — FREE WATER
200.0000 mL | Freq: Four times a day (QID) | Status: DC
  Administered 2016-03-28 – 2016-04-03 (×24): 200 mL

## 2016-03-28 MED ORDER — FUROSEMIDE 10 MG/ML IJ SOLN
60.0000 mg | Freq: Two times a day (BID) | INTRAMUSCULAR | Status: DC
Start: 2016-03-28 — End: 2016-04-01
  Administered 2016-03-28 – 2016-04-01 (×8): 60 mg via INTRAVENOUS
  Filled 2016-03-28 (×7): qty 6

## 2016-03-28 NOTE — Progress Notes (Signed)
Physical Therapy Treatment Patient Details Name: Kendra Osborne MRN: [id number] DOB: 11/26/1946 Today's Date: 03/28/2016    History of Present Illness pt presents with Large thyroid mass/goiter and Respiratory Difficulties.  pt has underwent Total Thyroidectomy with goiter removal and has remained intubated with one failed extubation on 3/24.  [name] and only known medical hx is HTN and Goiter.      PT Comments    Pt able to work on standing today with use of Maxisky to get to recliner and then Bariatric stedy to stand from recliner as pt is too short for ehr feet to safely reach the floor from the bed.  Interpreter line used and pt able to use PMSV to communicate with the interpreter, though at times hard for the interpreter to understand.  Continue to feel CIR is pt's best shot at rehab in order to eventually return to home.  Will continue to follow.    Follow Up Recommendations  CIR     Equipment Recommendations  None recommended by PT    Recommendations for Other Services       Precautions / Restrictions Precautions Precautions: Fall Precaution Comments:  (trach peg) Restrictions Weight Bearing Restrictions: No    Mobility  Bed Mobility Overal bed mobility: Needs Assistance Bed Mobility: Rolling Rolling: Mod assist         General bed mobility comments: pt log rolling R and L to place maxisky lift pad  Transfers Overall transfer level: Needs assistance   Transfers: Sit to/from Stand Sit to Stand: +2 physical assistance;Mod assist         General transfer comment: pt lifted to recliner with Maxisky then performed standing from recliner due to pt's short stature.  pt using bil Ue on bar to stedy and needed x2 attempts to help pt achieve static standing. pt with incr initiation to stand to return to sitting now with more understand of DME. pt with uncontrolled descend to chair.   Ambulation/Gait                 Stairs             Wheelchair Mobility    Modified Rankin (Stroke Patients Only)       Balance Overall balance assessment: Needs assistance         Standing balance support: Bilateral upper extremity supported;During functional activity Standing balance-Leahy Scale: Poor                      Cognition Arousal/Alertness: Awake/alert Behavior During Therapy: Flat affect Overall Cognitive Status: Impaired/Different from baseline Area of Impairment: Orientation;Attention;Memory;Safety/judgement;Awareness Orientation Level: Disoriented to;Place;Time;Situation Current Attention Level: Sustained Memory: Decreased short-term memory   Safety/Judgement: Decreased awareness of safety;Decreased awareness of deficits Awareness: Intellectual   General Comments: pt reports "i dont know what happen to me." pt asking for food several times over and over again to interpreter. Pt asking several times what is wrong with her voice. pt repeating statements that were undeteremined by interpreter to what pt was expressing    Exercises      General Comments        Pertinent Vitals/Pain Pain Assessment: No/denies pain    Home Living                      Prior Function            PT Goals (current goals can now be found in the  care plan section) Acute Rehab PT Goals Patient Stated Goal: requesting food PT Goal Formulation: Patient unable to participate in goal setting Time For Goal Achievement: 04/11/16 Potential to Achieve Goals: Good Progress towards PT goals: Progressing toward goals    Frequency  Min 3X/week    PT Plan Current plan remains appropriate    Co-evaluation PT/OT/SLP Co-Evaluation/Treatment: Yes Reason for Co-Treatment: Complexity of the patient's impairments (multi-system involvement);For patient/therapist safety;Necessary to address cognition/behavior during functional activity PT goals addressed during session: Mobility/safety with mobility;Balance        End of Session Equipment Utilized During Treatment: Gait belt;Oxygen Activity Tolerance: Patient tolerated treatment well Patient left: in chair;with call bell/phone within reach;with chair alarm set     Time: 1610-9604 PT Time Calculation (min) (ACUTE ONLY): 54 min  Charges:  $Therapeutic Activity: 8-22 mins                    G CodesSunny Schlein, [place] [phone] 03/28/2016, 3:23 PM

## 2016-03-28 NOTE — Progress Notes (Signed)
Occupational Therapy Treatment Patient Details Name: Kendra Osborne MRN: [id number] DOB: [birth year] Today's Date: 03/28/2016    History of present illness pt presents with Large thyroid mass/goiter and Respiratory Difficulties.  pt has underwent Total Thyroidectomy with goiter removal and has remained intubated with one failed extubation on 3/24.  pt recently came to the [place] from [place] and only known medical hx is HTN and Goiter.     OT comments  Pt completed maxisky transfer to chair with pad. Once in chair: pt completed sit<>stand in bariatric stedy (borrowed DME from another unit to help facilitate session). Pt remains oob to chair with maxisky pad placed for easy transfer for RN staff. RN Darien Ramus present in room and educated on need to transfer back to bed after 1 hour.    Follow Up Recommendations  CIR    Equipment Recommendations  Hospital bed;Wheelchair cushion (measurements OT);Wheelchair (measurements OT);3 in 1 bedside comode;Other (comment) (lift)    Recommendations for Other Services Rehab consult    Precautions / Restrictions Precautions Precautions: Fall Precaution Comments:  (trach peg)       Mobility Bed Mobility Overal bed mobility: Needs Assistance Bed Mobility: Rolling Rolling: Mod assist         General bed mobility comments: pt log rolling R and L to place maxisky lift pad  Transfers Overall transfer level: Needs assistance   Transfers: Sit to/from Stand Sit to Stand: +2 physical assistance;Mod assist         General transfer comment: pt using bil Ue on bar to stedy and needed x2 attempts to help pt achieve static standing. pt with incr initiation to stand to return to sitting now with more understand of DME. pt with uncontrolled descend to chair.     Balance                                   ADL Overall ADL's : Needs assistance/impaired Eating/Feeding: NPO                                     General ADL Comments:  Session focused on transfers. pt maxisky lift from bed to chair. From chair bari stedy used to (A) patient into standing. pt tolerated static standing with flaps down in stedy for several minutes. Pt leaning heavily on bar to stedy. pt reports incr comfort OOB in chair. RN ana educated on need to have reposition back ot bed in 1 hour due to skin risk break down. pt with lift pad remaining in chair for easy transfer      Vision                     Perception     Praxis      Cognition   Behavior During Therapy: Flat affect Overall Cognitive Status: Impaired/Different from baseline Area of Impairment: Orientation;Attention;Memory;Safety/judgement;Awareness Orientation Level: Disoriented to;Place;Time;Situation Current Attention Level: Sustained Memory: Decreased short-term memory    Safety/Judgement: Decreased awareness of safety;Decreased awareness of deficits Awareness: Intellectual   General Comments: pt reports "i dont know what happen to me." pt asking for food several times over and over again to interpreter. Pt asking several times what is wrong with her voice. pt repeating statements that were undeteremined by interpreter to what pt was expressing    Extremity/Trunk Assessment  Exercises     Shoulder Instructions       General Comments      Pertinent Vitals/ Pain       Pain Assessment: No/denies pain  Home Living                                          Prior Functioning/Environment              Frequency Min 2X/week     Progress Toward Goals  OT Goals(current goals can now be found in the care plan section)  Progress towards OT goals: Progressing toward goals  Acute Rehab OT Goals Patient Stated Goal: requesting food OT Goal Formulation: Patient unable to participate in goal setting Time For Goal Achievement: 04/04/16 Potential to Achieve Goals: Good ADL Goals Pt Will Perform Grooming: with min  assist;standing Pt Will Perform Upper Body Bathing: with supervision;sitting Pt Will Perform Lower Body Bathing: with min assist;sit to/from stand Pt Will Perform Upper Body Dressing: with min assist;sitting Pt Will Perform Lower Body Dressing: with min assist;sit to/from stand Pt Will Transfer to Toilet: with min assist;ambulating;regular height toilet;bedside commode;grab bars Pt Will Perform Toileting - Clothing Manipulation and hygiene: with min assist;sit to/from stand Pt/caregiver will Perform Home Exercise Program: Increased strength;Both right and left upper extremity;With theraband;With Supervision;With written HEP provided  Plan Discharge plan needs to be updated    Co-evaluation    PT/OT/SLP Co-Evaluation/Treatment: Yes Reason for Co-Treatment: Complexity of the patient's impairments (multi-system involvement);For patient/therapist safety          End of Session Equipment Utilized During Treatment: Gait belt;Oxygen   Activity Tolerance Patient tolerated treatment well   Patient Left in chair;with call bell/phone within reach;with chair alarm set;with nursing/sitter in room;with restraints reapplied   Nurse Communication Mobility status;Precautions;Need for lift equipment        Time: 1354-1448 OT Time Calculation (min): 54 min  Charges: OT General Charges $OT Visit: 1 Procedure OT Treatments $Therapeutic Activity: 23-37 mins  [name], [name] 03/28/2016, 3:09 PM    [name], [name]   OTR/L Pager: [phone] Office: [phone] .

## 2016-03-28 NOTE — Progress Notes (Signed)
Speech Language Pathology Treatment: Dysphagia;Passy [name] Speaking valve  Patient Details Name: Kendra Osborne MRN: [id number] DOB: 01/04/1946 Today's Date: 03/28/2016 Time: [id number] SLP Time Calculation (min) (ACUTE ONLY): 13 min  Assessment / Plan / Recommendation Clinical Impression  Pt with much improved toleration of PMV since trach was changed to a cuffless - VS stable, Sp02 remained >95%.  Educated son how to place/remove valve with gloved hands; he was able to demonstrate successfully.  Son verbalized understanding about parameters requiring removal of valve (change in VS, distress).  Provided trial POs with pt - limited boluses of puree; she required max verbal/tactile cues to attend to PO trials, engage in effortful swallow and purposeful cough to expel penetrates.  Recommend continued therapeutic PO trials with SLP only; pt may use PMV with full supervision by staff and her son.    HPI HPI: 70 y.o. woman who recently moved from [name] (no known PMHx) who presented to ED with respiratory distress and lower extremity edema. Pt was intubated 3/17 and failed to wean, therefore trach placed. CXR 3/31 stable bibasilar atelectasis.       SLP Plan  Continue with current plan of care     Recommendations  Diet recommendations: NPO      Patient may use [name] Speech Valve: Intermittently with supervision (with supervision from son) PMSV Supervision: Full      Oral Care Recommendations: Oral care QID Plan: Continue with current plan of care     GO                Kendra Osborne, Kendra Osborne 03/28/2016, 11:24 AM

## 2016-03-28 NOTE — Progress Notes (Addendum)
[facility] TEAM 1 - Stepdown/ICU TEAM PROGRESS NOTE  Kendra Osborne ZOX:[id number] DOB: 01/12/1946 DOA: 02/24/2016 PCP: No PCP Per Patient  Admit HPI / Brief Narrative: 70 y/o woman recently moved from [name] (speaks Urdu) who presented to the ED with respiratory distress and LE edmea on 3/17. She has a Hx of a goiter as well as hypertension. It is not clear if the goiter was ever worked up or if she was on regular therapy for her hypertension.   HPI/Subjective: Pt appears agitated and confused, but not in any distress.  There is no family present at the time of my visit.    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 -Multifactorial to include element of pulmonary edema vs viral bronchitis/pneumonitis, extrinsic airway compression, and oversedation -Minimize all pain medication, benzodiazepines, psychogenic medications  -4/17 trach team changed out the #6 XLT cuffed trach to a #6 XLT uncuffed - Unable to downsize to #4 secondary to copious secretions. -4/18 patient tolerating PMV -ongoing trach care per PCCM   Aspiration event  -Has been tx for aspiration pneumonitis - SLP following dysphagia    Extrinsic Airway Compression Secondary to goiter -Resolved S/P total thyroidectomy 3/21  Altered mental status -pt remains altered - metabolic w/u ordered   HTN  -BP currently well controlled  Chronic grade 1 diastolic CHF Filed Weights   03/26/16 0432 03/27/16 0500 03/28/16 0600  Weight: 96 kg (211 lb 10.3 oz) 99.1 kg (218 lb 7.6 oz) 101.9 kg (224 lb 10.4 oz)  mild LE edema on exam - increase diuretic and follow   Acute Renal Failure  -Resolved  Recent Labs Lab 03/23/16 0450 03/24/16 0943 03/25/16 0856 03/26/16 1248 03/27/16 1450  CREATININE 0.79 5.27* 0.81 0.85 0.84   Dyspahgia -failed swallow study - cont PEG feeds   Postoperative Hypothyroidism -S/P Total Thyroidectomy - previously normal TSH - cont synthroid - will need to check  TSH in 6-8 weeks as adjustment of dose likely to be required   Hypomagnesemia -corrected   Hypokalemia -recheck in AM  Code Status: FULL Family Communication: no family present at time of exam Disposition Plan: SDU   Consultants: ENT IR  PCCM  Procedures/Significant Events: 3/18 CTA of Chest > mild mediastinal enlargement 3/18 CT soft tissue Neck with contrast > multinodular goiter, meningioma 3/19 TTE EF 60%-65% - grade 1 diastolic dysfunction 3/21 Thyroidectomy 3/24 - Extubation & reintubation within 2 hours due to hypoxia & secretions 4/6  24 hr trach collar 4/6 IR GASTROSTOMY TUBE  4/7 tracheostomy tube change - old 6 cuffed proximal XLT shiley was removed with the old silk sutures, and a new 6 cuffless proximal XLT shiley was placed  4/8 transferred back to icu for resp distress/asp pna. Trach changed to #6 cuffed XLT at bedside. 4/12 trach collar 4/17 #6 cuffed XLT change to #6 cuffless XLT  Antibiotics: Unasyn 3/24 > 4/3;4/8 > 4/18 Ancef 3/21 > 3/24 Clindamycin 3/21  Azithromycin 3/18 > 3/19 Rocephin 3/18 > 3/20  DVT prophylaxis: SCDs  Objective: Blood pressure 125/57, pulse 74, temperature 99.2 F (37.3 C), temperature source Axillary, resp. rate 30, height 5' (1.524 m), weight 101.9 kg (224 lb 10.4 oz), SpO2 96 %.  Intake/Output Summary (Last 24 hours) at 03/28/16 1327 Last data filed at 03/28/16 1200  Gross per 24 hour  Intake   1230 ml  Output   1475 ml  Net   -245 ml   Exam: General: No acute respiratory distress - agitated  Lungs: Clear  to auscultation bilaterally without wheezes or crackles - poor air movement B bases Cardiovascular: Regular rate and rhythm without murmur gallop or rub normal S1 and S2 Abdomen: Nontender, nondistended, soft, bowel sounds positive, no rebound, no ascites, no appreciable mass Extremities: No significant cyanosis, or clubbing - trace edema bilateral lower extremities  Data Reviewed:  Basic Metabolic  Panel:  Recent Labs Lab 03/23/16 0450  03/24/16 0943  03/25/16 0856 03/26/16 0552 03/26/16 1248 03/27/16 0520 03/27/16 1450 03/28/16 0357  NA 145  --  136  --  138  --  138  --  137  --   K 3.5  --  4.7  --  3.3*  --  3.2*  --  3.5  --   CL 100*  --  100*  --  90*  --  89*  --  87*  --   CO2 35*  --  24  --  37*  --  40*  --  39*  --   GLUCOSE 136*  --  105*  --  142*  --  143*  --  129*  --   BUN 32*  --  110*  --  31*  --  35*  --  41*  --   CREATININE 0.79  --  5.27*  --  0.81  --  0.85  --  0.84  --   CALCIUM 8.4*  --  8.2*  --  8.5*  --  8.2*  --  8.1*  --   MG 1.8  < >  --   < > 1.7 2.9*  --  2.4 2.5* 2.3  < > = values in this interval not displayed.  CBC:  Recent Labs Lab 03/23/16 0450 03/24/16 0943 03/25/16 1116 03/27/16 1450  WBC 8.7 22.4* 10.1 12.0*  NEUTROABS  --  19.1* 7.0  --   HGB 9.6* 9.6* 10.1* 9.1*  HCT 33.7* 29.6* 34.0* 29.8*  MCV 89.2 93.1 86.3 88.4  PLT 314 157 308 268    Liver Function Tests:  Recent Labs Lab 03/24/16 0943 03/25/16 0856  AST 15 32  ALT 16 23  ALKPHOS 75 83  BILITOT 0.4 0.7  PROT 5.5* 7.7  ALBUMIN 1.7* 2.3*   CBG:  Recent Labs Lab 03/27/16 1555 03/27/16 2033 03/28/16 0022 03/28/16 0355 03/28/16 0724  GLUCAP 111* 126* 123* 126* 129*   Studies:   Recent x-ray studies have been reviewed in detail by the Attending Physician  Scheduled Meds:  Scheduled Meds: . atropine  2 drop Sublingual QID  . bacitracin   Topical TID  . chlorhexidine  15 mL Mouth Rinse BID  . docusate  100 mg Oral BID  . fentaNYL  100 mcg Transdermal Q72H  . free water  300 mL Per Tube Q4H  . furosemide  40 mg Intravenous Q12H  . heparin subcutaneous  5,000 Units Subcutaneous 3 times per day  . levothyroxine  50 mcg Intravenous QAC breakfast  . pantoprazole sodium  40 mg Per Tube Daily  . polyethylene glycol  17 g Oral Daily  . QUEtiapine  50 mg Oral BID  . sodium chloride flush  10-40 mL Intracatheter Q12H    Time spent on care of  this patient: 25 mins   Keigan Girten T , MD   [facility] Office  [phone] Pager - Text Page per Loretha Stapler as per below:  On-Call/Text Page:      [email or web address]      password [email or web address]  If 7PM-7AM, please contact night-coverage  [email or web address] Password [email or web address] 03/28/2016, 1:27 PM   LOS: 32 days

## 2016-03-29 DIAGNOSIS — Z430 Encounter for attention to tracheostomy: Secondary | ICD-10-CM

## 2016-03-29 LAB — COMPREHENSIVE METABOLIC PANEL
ALT: 22 U/L (ref 14–54)
ANION GAP: 11 (ref 5–15)
AST: 30 U/L (ref 15–41)
Albumin: 2 g/dL — ABNORMAL LOW (ref 3.5–5.0)
Alkaline Phosphatase: 60 U/L (ref 38–126)
BILIRUBIN TOTAL: 0.5 mg/dL (ref 0.3–1.2)
BUN: 29 mg/dL — AB (ref 6–20)
CALCIUM: 8.3 mg/dL — AB (ref 8.9–10.3)
CO2: 38 mmol/L — ABNORMAL HIGH (ref 22–32)
Chloride: 86 mmol/L — ABNORMAL LOW (ref 101–111)
Creatinine, Ser: 0.75 mg/dL (ref 0.44–1.00)
GFR calc Af Amer: >60 mL/min (ref >60)
Glucose, Bld: 138 mg/dL — ABNORMAL HIGH (ref 65–99)
POTASSIUM: 3.5 mmol/L (ref 3.5–5.1)
Sodium: 135 mmol/L (ref 135–145)
TOTAL PROTEIN: 6.9 g/dL (ref 6.5–8.1)

## 2016-03-29 LAB — VITAMIN B12: VITAMIN B 12: 1163 pg/mL — AB (ref 180–914)

## 2016-03-29 LAB — FERRITIN: Ferritin: 70 ng/mL (ref 11–307)

## 2016-03-29 LAB — IRON AND TIBC
Iron: 37 ug/dL (ref 28–170)
SATURATION RATIOS: 14 % (ref 10.4–31.8)
TIBC: 259 ug/dL (ref 250–450)
UIBC: 222 ug/dL

## 2016-03-29 LAB — FOLATE: FOLATE: 22.2 ng/mL (ref >5.9)

## 2016-03-29 LAB — GLUCOSE, CAPILLARY
GLUCOSE-CAPILLARY: 132 mg/dL — AB (ref 65–99)
GLUCOSE-CAPILLARY: 142 mg/dL — AB (ref 65–99)
GLUCOSE-CAPILLARY: 147 mg/dL — AB (ref 65–99)
Glucose-Capillary: 137 mg/dL — ABNORMAL HIGH (ref 65–99)
Glucose-Capillary: 155 mg/dL — ABNORMAL HIGH (ref 65–99)
Glucose-Capillary: 147 mg/dL — ABNORMAL HIGH (ref 65–99)

## 2016-03-29 LAB — CBC
HEMATOCRIT: 30.7 % — AB (ref 36.0–46.0)
Hemoglobin: 9.2 g/dL — ABNORMAL LOW (ref 12.0–15.0)
MCH: 26 pg (ref 26.0–34.0)
MCHC: 30 g/dL (ref 30.0–36.0)
MCV: 86.7 fL (ref 78.0–100.0)
Platelets: 290 10*3/uL (ref 150–400)
RBC: 3.54 MIL/uL — ABNORMAL LOW (ref 3.87–5.11)
RDW: 19.3 % — AB (ref 11.5–15.5)
WBC: 13.6 10*3/uL — AB (ref 4.0–10.5)

## 2016-03-29 LAB — RETICULOCYTES
RBC.: 3.54 MIL/uL — AB (ref 3.87–5.11)
RETIC COUNT ABSOLUTE: 81.4 10*3/uL (ref 19.0–186.0)
Retic Ct Pct: 2.3 % (ref 0.4–3.1)

## 2016-03-29 LAB — HIV ANTIBODY (ROUTINE TESTING W REFLEX): HIV SCREEN 4TH GENERATION: NONREACTIVE

## 2016-03-29 LAB — AMMONIA: AMMONIA: 41 umol/L — AB (ref 9–35)

## 2016-03-29 LAB — RPR: RPR: NONREACTIVE

## 2016-03-29 MED ORDER — LORAZEPAM 2 MG/ML IJ SOLN
0.5000 mg | Freq: Once | INTRAMUSCULAR | Status: AC
  Administered 2016-03-29: 0.5 mg via INTRAVENOUS
  Filled 2016-03-29: qty 1

## 2016-03-29 MED ORDER — LORAZEPAM 2 MG/ML IJ SOLN: 1.0000 mg | Freq: Once | INTRAMUSCULAR | Status: DC

## 2016-03-29 MED ORDER — FENTANYL 25 MCG/HR TD PT72
25.0000 ug | MEDICATED_PATCH | TRANSDERMAL | Status: DC
  Administered 2016-04-01: 25 ug via TRANSDERMAL
  Filled 2016-03-29: qty 1

## 2016-03-29 MED ORDER — ACETAMINOPHEN 160 MG/5ML PO SOLN
500.0000 mg | Freq: Four times a day (QID) | ORAL | Status: DC | PRN
  Administered 2016-04-02 – 2016-04-08 (×4): 500 mg
  Filled 2016-03-29 (×4): qty 20.3

## 2016-03-29 NOTE — Progress Notes (Signed)
O2 weaned off, pt placed on 21% ATC. Pt tolerating at this time. RT will continue to monitor.

## 2016-03-29 NOTE — Progress Notes (Signed)
PULMONARY / CRITICAL CARE MEDICINE    Name: Kendra Osborne MRN: [id number] DOB: 11-Aug-1946    ADMISSION DATE:  02/24/2016  PRIMARY SERVICE: PCCM  CHIEF COMPLAINT:  Dyspnea  BRIEF PATIENT DESCRIPTION: 70 y/o woman recently moved from [place] who presents with respiratory distress. She presented to the ED on 3/17 with complaints of dyspnea, and lower extremity edema. History is limited due to language / culture barriers, but she has a PMHx of a known goiter that has been present for years, as well as possibly hypertension. It is not clear if the goiter was ever worked up or if she ever was on regular therapy for her hypertension. Her Nephew and son (who speaks little [place]) report that she been having a cough for a few days as well as some fevers and chills. Had aspiration event after trach change and was placed back on cuffed trach with vent, now out to SDU.   SUBJECTIVE:   On TC 28% > tolerating well. No need for vent. Less secretions   VITAL SIGNS: Temp:  [97.9 F (36.6 C)-99.4 F (37.4 C)] 99 F (37.2 C) (04/20 1205) Pulse Rate:  [71-79] 79 (04/20 1205) Resp:  [16-32] 21 (04/20 1205) BP: (85-156)/(39-87) 143/69 mmHg (04/20 1205) SpO2:  [93 %-100 %] 100 % (04/20 1205) FiO2 (%):  [28 %] 28 % (04/20 1003) Weight:  [223 lb 15.8 oz (101.6 kg)] 223 lb 15.8 oz (101.6 kg) (04/20 0500) HEMODYNAMICS:   VENTILATOR SETTINGS: Vent Mode:  [-]  FiO2 (%):  [28 %] 28 %  INTAKE / OUTPUT: Intake/Output      04/19 0701 - 04/20 0700 04/20 0701 - 04/21 0700   Other     NG/GT 330 220   IV Piggyback     Total Intake(mL/kg) 330 (3.2) 220 (2.2)   Urine (mL/kg/hr) 1850 (0.8) 1450 (2.4)   Total Output 1850 1450   Net -1520 -1230         PHYSICAL EXAMINATION: General: comfortable.  Trach in place on tach collar.  Obese woman Integument:  Warm & dry. No rash on exposed skin. HEENT:  Trach in place, poor dentition.  Cardiovascular:  s1 s2 RRR Pulmonary:  Some coarse breath sounds in lower  lobes Abdomen: Soft, Normal bowel sounds. Nondistended. PEG. Some facial grimace with abd palpation Neurological: Spontaneously moving all 4 extremities. Eyes open. Appears grossly nonfocal. Less agitated today, follows simple gestures ie. take a big breath with prompting  LABS:  CBC  Recent Labs Lab 03/25/16 1116 03/27/16 1450 03/29/16 0343  WBC 10.1 12.0* 13.6*  HGB 10.1* 9.1* 9.2*  HCT 34.0* 29.8* 30.7*  PLT 308 268 290   Coag's No results for input(s): APTT, INR in the last 168 hours. BMET  Recent Labs Lab 03/26/16 1248 03/27/16 1450 03/29/16 0343  NA 138 137 135  K 3.2* 3.5 3.5  CL 89* 87* 86*  CO2 40* 39* 38*  BUN 35* 41* 29*  CREATININE 0.85 0.84 0.75  GLUCOSE 143* 129* 138*   Electrolytes  Recent Labs Lab 03/26/16 1248 03/27/16 0520 03/27/16 1450 03/28/16 0357 03/29/16 0343  CALCIUM 8.2*  --  8.1*  --  8.3*  MG  --  2.4 2.5* 2.3  --    Sepsis Markers No results for input(s): LATICACIDVEN, PROCALCITON, O2SATVEN in the last 168 hours. ABG No results for input(s): PHART, PCO2ART, PO2ART in the last 168 hours. Liver Enzymes  Recent Labs Lab 03/24/16 0943 03/25/16 0856 03/29/16 0343  AST 15 32 30  ALT  16 23 22   ALKPHOS 75 83 60  BILITOT 0.4 0.7 0.5  ALBUMIN 1.7* 2.3* 2.0*   Cardiac Enzymes No results for input(s): TROPONINI, PROBNP in the last 168 hours. Glucose  Recent Labs Lab 03/28/16 1234 03/28/16 1659 03/28/16 1935 03/28/16 2355 03/29/16 0400 03/29/16 0742  GLUCAP 144* 115* 124* 132* 137* 155*   Imaging Dg Swallowing Func-speech Pathology  03/27/2016  Objective Swallowing Evaluation: Type of Study: MBS-Modified Barium Swallow Study Patient Details Name: Kendra Osborne MRN: [id number] Date of Birth: 01/12/1946 Today's Date: 03/27/2016 Time: SLP Start Time (ACUTE ONLY): 1335-SLP Stop Time (ACUTE ONLY): 1400 SLP Time Calculation (min) (ACUTE ONLY): 25 min Past Medical History: Past Medical History Diagnosis Date . Hypertension  . Chronic  diastolic CHF (congestive heart failure) [facility](HCC)  Past Surgical History: Past Surgical History Procedure Laterality Date . Thyroidectomy N/A 02/28/2016   Procedure:  TOTAL THYROIDECTOMY WITH NIMS MONITOR;  Surgeon: Melvenia BeamMitchell Gore, MD;  [facility];  Service: ENT;  Laterality: N/A; . Tracheostomy tube placement N/A 03/09/2016   Procedure: TRACHEOSTOMY;  Surgeon: Melvenia BeamMitchell Gore, MD;  [facility];  Service: ENT;  Laterality: N/A; HPI: 70 y.o. woman who recently moved from [name] (no known PMHx) who presented to ED with respiratory distress and lower extremity edema. Pt was intubated 3/17 and failed to wean, therefore trach placed. CXR 3/31 stable bibasilar atelectasis.  Subjective: pt alert, with PMSV in place Assessment / Plan / Recommendation CHL IP CLINICAL IMPRESSIONS 03/27/2016 Therapy Diagnosis Mild oral phase dysphagia;Moderate pharyngeal phase dysphagia Clinical Impression Pt has a mild oral and moderate pharyngeal dysphagia with silent penetration of solids and liquids. She has oral holding followed by slow a/p transit with boluses then reaching the valleculae prior to swallow trigger. She has limited hyolaryngeal movement and incomplete airway closure that leads to silent penetration and intermittent aspiration of nectar thick liquids by tsp. Pureed solids are also silently penetrated with most but not all penetrates cleared with cued cough. SLP provided cueing to attempt chin tuck, but ROM was limited and pt was not able to maintain this positioning. Recommend that pt remain NPO at this time with additional f/u by SLP for PO readiness. Education provided via son, who assisted with translation. Impact on safety and function Severe aspiration risk   CHL IP TREATMENT RECOMMENDATION 03/27/2016 Treatment Recommendations Therapy as outlined in treatment plan below   Prognosis 03/27/2016 Prognosis for Safe Diet Advancement Fair Barriers to Reach Goals Severity of deficits Barriers/Prognosis Comment -- CHL IP DIET  RECOMMENDATION 03/27/2016 SLP Diet Recommendations NPO Liquid Administration via -- Medication Administration Via alternative means Compensations -- Postural Changes --   CHL IP OTHER RECOMMENDATIONS 03/27/2016 Recommended Consults -- Oral Care Recommendations Oral care QID Other Recommendations --   CHL IP FOLLOW UP RECOMMENDATIONS 03/27/2016 Follow up Recommendations LTACH   CHL IP FREQUENCY AND DURATION 03/27/2016 Speech Therapy Frequency (ACUTE ONLY) min 2x/week Treatment Duration 2 weeks      CHL IP ORAL PHASE 03/27/2016 Oral Phase Impaired Oral - Pudding Teaspoon -- Oral - Pudding Cup -- Oral - Honey Teaspoon -- Oral - Honey Cup -- Oral - Nectar Teaspoon Holding of bolus;Delayed oral transit Oral - Nectar Cup -- Oral - Nectar Straw -- Oral - Thin Teaspoon -- Oral - Thin Cup -- Oral - Thin Straw -- Oral - Puree Holding of bolus;Delayed oral transit Oral - Mech Soft -- Oral - Regular -- Oral - Multi-Consistency -- Oral - Pill -- Oral Phase - Comment --  CHL IP PHARYNGEAL  PHASE 03/27/2016 Pharyngeal Phase Impaired Pharyngeal- Pudding Teaspoon -- Pharyngeal -- Pharyngeal- Pudding Cup -- Pharyngeal -- Pharyngeal- Honey Teaspoon -- Pharyngeal -- Pharyngeal- Honey Cup -- Pharyngeal -- Pharyngeal- Nectar Teaspoon Delayed swallow initiation-vallecula;Reduced anterior laryngeal mobility;Reduced laryngeal elevation;Reduced airway/laryngeal closure;Penetration/Aspiration during swallow Pharyngeal Material enters airway, passes BELOW cords without attempt by patient to eject out (silent aspiration) Pharyngeal- Nectar Cup -- Pharyngeal -- Pharyngeal- Nectar Straw -- Pharyngeal -- Pharyngeal- Thin Teaspoon -- Pharyngeal -- Pharyngeal- Thin Cup -- Pharyngeal -- Pharyngeal- Thin Straw -- Pharyngeal -- Pharyngeal- Puree Delayed swallow initiation-vallecula;Reduced anterior laryngeal mobility;Reduced laryngeal elevation;Reduced airway/laryngeal closure;Penetration/Aspiration during swallow Pharyngeal Material enters airway, remains  ABOVE vocal cords and not ejected out Pharyngeal- Mechanical Soft -- Pharyngeal -- Pharyngeal- Regular -- Pharyngeal -- Pharyngeal- Multi-consistency -- Pharyngeal -- Pharyngeal- Pill -- Pharyngeal -- Pharyngeal Comment --  CHL IP CERVICAL ESOPHAGEAL PHASE 03/27/2016 Cervical Esophageal Phase (No Data) Pudding Teaspoon -- Pudding Cup -- Honey Teaspoon -- Honey Cup -- Nectar Teaspoon -- Nectar Cup -- Nectar Straw -- Thin Teaspoon -- Thin Cup -- Thin Straw -- Puree -- Mechanical Soft -- Regular -- Multi-consistency -- Pill -- Cervical Esophageal Comment -- No flowsheet data found. Maxcine Ham, M.A. CCC-SLP [phone] Maxcine Ham 03/27/2016, 2:40 PM              SIGNIFICANT EVENTS: 3/17 - Admit 3/21 - Thyroidectomy 3/24 - Extubation & reintubation within 2 hours due to hypoxia & secretions 4/6 -  24 hr trach collar 4/7- Trach change by ENT to 6 uncuffed proximal XLT 4/8 - Transferred back to ICU for resp distress/asp pna. Trach changed to #6 cuffed XLT at bedside.  4/12 - Trach collar 4/17 - Plan to change trach to 6 cuffless  STUDIES:  CT-A of Chest >> mild mediastinal enlargement CT of the Neck with contrast >> multinodular goiter, meningioma Port CXR 3/22:  ETT & CVL in good position. Blunting bilateral costophrenic angles suggestive of effusions. Bilateral hilar fullness & interstitial prominence with low lung volumes.  Port CXR 3/25:. Silhouetting of the left hemidiaphragm. Platelike atelectasis right lower lung. Endotracheal tube in good position. Ort CXR 4/8: incr bibasilar atelectasis 4/12 pCXR: persistent elevation of right hemidiaphragm, bibasilar atelectasis vs infltrate  LINES / TUBES: OETT 7.5 3/17 - 3/24; 7.0 3/24>>>3/31 Trach (ENT) 3/31>>> L Subclavian CVL 3/21>>> L Port 3/21>>> OGT 3/21 - 3/24; 3/24>>> Foley 3/18>>> L Radial Art Line 3/21 - 3/23 R & L Neck Drains 3/21 - ???  MICROBIOLOGY: Tracheal Asp Ctx 3/25>>> ng Blood Ctx x2 3/18:  Negative  MRSA PCR  3/18:  Negative  Trache asp 4/8 > candida   ANTIBIOTICS: Unasyn 3/24>>> Ancef 3/21 - 3/24 Clindamycin 3/21 (only 1 dose) Azithromycin 3/18 - 3/19 Rocephin 3/18 - 3/20  ASSESSMENT / PLAN:   Acute on Chronic Hypercarbic Respiratory Failure Acute Hypoxic Respiratory Failure - Aspiration event with concern for aspiration PNA Extrinsic Airway Compression - Secondary to goiter. S/P total thyroidectomy 3/21. Elevated Right Hemidiaphragm  Dyspahgia  Pt now has a 6 cuffless trache since 4/17.  - told nurse and RT to teach family re: trache care. Pt will need to go home as she cant be placed. - plan to leave the trache for now. No plans for decannulating.  -trache collar 28% at daytime > try to wean off to RA as long as sats > 91%.  -Unasyn for aspiration stopped on  4/18   J. Alexis Frock, MD 03/29/2016, 12:56 PM [facility] Pager [phone]  1310 After 3 pm or if no answer, call [phone]

## 2016-03-29 NOTE — Progress Notes (Signed)
[facility] TEAM 1 - Stepdown/ICU TEAM PROGRESS NOTE  Kennah Hehr ZOX:[id number] DOB: [birth year] DOA: 02/24/2016 PCP: No PCP Per Patient  Admit HPI / Brief Narrative: 70 y/o woman recently moved from [place] (speaks Urdu) who presented to the ED with respiratory distress and LE edmea on 3/17. She has a Hx of a goiter as well as hypertension. It is not clear if the goiter was ever worked up or if she was on regular therapy for her hypertension.   HPI/Subjective: No acute distress.  Remains somewhat confused and agitated.    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 -Multifactorial to include element of pulmonary edema vs viral bronchitis/pneumonitis, extrinsic airway compression, and oversedation -Minimize all pain medication, benzodiazepines, psychogenic medications  -4/17 trach team changed out the #6 XLT cuffed trach to a #6 XLT uncuffed - unable to downsize to #4 secondary to copious secretions -4/18 patient tolerating PMV -ongoing trach care per PCCM   Aspiration event  -Has been tx for aspiration pneumonitis - SLP following dysphagia - remains NPO at this time   Extrinsic Airway Compression Secondary to goiter -resolved S/P total thyroidectomy 3/21  Altered mental status -pt remains altered - B12 + folate normal - RPR and HIV negative - ammonia not signif elevated - ?ICU psychosis / sundowning - seroquel low dose QHS only   HTN  -BP currently well controlled  Chronic grade 1 diastolic CHF Filed Weights   03/27/16 0500 03/28/16 0600 03/29/16 0500  Weight: 99.1 kg (218 lb 7.6 oz) 101.9 kg (224 lb 10.4 oz) 101.6 kg (223 lb 15.8 oz)  mild LE edema on exam - cont diuretic and follow   Acute Renal Failure  -Resolved  Recent Labs Lab 03/24/16 0943 03/25/16 0856 03/26/16 1248 03/27/16 1450 03/29/16 0343  CREATININE 5.27* 0.81 0.85 0.84 0.75   Dyspahgia -failed swallow study - cont PEG feeds - SLP following w/ [place]   Postoperative  Hypothyroidism -S/P Total Thyroidectomy - previously normal TSH - cont synthroid - will need to check TSH in 6-8 weeks as adjustment of dose likely to be required   Hypomagnesemia -corrected   Hypokalemia -corrected  Obesity - Body mass index is 43.74 kg/(m^2).  Code Status: FULL Family Communication: no family present at time of exam Disposition Plan: SDU   Consultants: ENT IR  PCCM  Procedures/Significant Events: 3/18 CTA of Chest > mild mediastinal enlargement 3/18 CT soft tissue Neck with contrast > multinodular goiter, meningioma 3/19 TTE EF 60%-65% - grade 1 diastolic dysfunction 3/21 Thyroidectomy 3/24 - Extubation & reintubation within 2 hours due to hypoxia & secretions 4/6  24 hr trach collar 4/6 IR GASTROSTOMY TUBE  4/7 tracheostomy tube change - old 6 cuffed proximal XLT shiley was removed with the old silk sutures, and a new 6 cuffless proximal XLT shiley was placed  4/8 transferred back to icu for resp distress/asp pna. Trach changed to #6 cuffed XLT at bedside. 4/12 trach collar 4/17 #6 cuffed XLT change to #6 cuffless XLT  Antibiotics: Unasyn 3/24 > 4/3;4/8 > 4/18 Ancef 3/21 > 3/24 Clindamycin 3/21  Azithromycin 3/18 > 3/19 Rocephin 3/18 > 3/20  DVT prophylaxis: SCDs  Objective: Blood pressure 143/69, pulse 76, temperature 99 F (37.2 C), temperature source Oral, resp. rate 23, height 5' (1.524 m), weight 101.6 kg (223 lb 15.8 oz), SpO2 98 %.  Intake/Output Summary (Last 24 hours) at 03/29/16 1449 Last data filed at 03/29/16 1206  Gross per 24 hour  Intake  275 ml  Output   3300 ml  Net  -3025 ml   Exam: General: No acute respiratory distress Lungs: Clear to auscultation bilaterally without wheezes  Cardiovascular: Regular rate and rhythm  Abdomen: Nontender, obese, soft, bowel sounds positive, no rebound, no ascites, no appreciable mass Extremities: No significant cyanosis, clubbing; trace edema bilateral lower extremities  Data  Reviewed:  Basic Metabolic Panel:  Recent Labs Lab 03/24/16 0943  03/25/16 0856 03/26/16 0552 03/26/16 1248 03/27/16 0520 03/27/16 1450 03/28/16 0357 03/29/16 0343  NA 136  --  138  --  138  --  137  --  135  K 4.7  --  3.3*  --  3.2*  --  3.5  --  3.5  CL 100*  --  90*  --  89*  --  87*  --  86*  CO2 24  --  37*  --  40*  --  39*  --  38*  GLUCOSE 105*  --  142*  --  143*  --  129*  --  138*  BUN 110*  --  31*  --  35*  --  41*  --  29*  CREATININE 5.27*  --  0.81  --  0.85  --  0.84  --  0.75  CALCIUM 8.2*  --  8.5*  --  8.2*  --  8.1*  --  8.3*  MG  --   < > 1.7 2.9*  --  2.4 2.5* 2.3  --   < > = values in this interval not displayed.  CBC:  Recent Labs Lab 03/23/16 0450 03/24/16 0943 03/25/16 1116 03/27/16 1450 03/29/16 0343  WBC 8.7 22.4* 10.1 12.0* 13.6*  NEUTROABS  --  19.1* 7.0  --   --   HGB 9.6* 9.6* 10.1* 9.1* 9.2*  HCT 33.7* 29.6* 34.0* 29.8* 30.7*  MCV 89.2 93.1 86.3 88.4 86.7  PLT 314 157 308 268 290    Liver Function Tests:  Recent Labs Lab 03/24/16 0943 03/25/16 0856 03/29/16 0343  AST 15 32 30  ALT 16 23 22   ALKPHOS 75 83 60  BILITOT 0.4 0.7 0.5  PROT 5.5* 7.7 6.9  ALBUMIN 1.7* 2.3* 2.0*   CBG:  Recent Labs Lab 03/28/16 1935 03/28/16 2355 03/29/16 0400 03/29/16 0742 03/29/16 1202  GLUCAP 124* 132* 137* 155* 147*   Studies:   Recent x-ray studies have been reviewed in detail by the Attending Physician  Scheduled Meds:  Scheduled Meds: . bacitracin   Topical TID  . chlorhexidine  15 mL Mouth Rinse BID  . docusate  100 mg Oral BID  . fentaNYL  50 mcg Transdermal Q72H  . free water  200 mL Per Tube Q6H  . furosemide  60 mg Intravenous Q12H  . heparin subcutaneous  5,000 Units Subcutaneous 3 times per day  . levothyroxine  50 mcg Intravenous QAC breakfast  . pantoprazole sodium  40 mg Per Tube Daily  . polyethylene glycol  17 g Per Tube Daily  . QUEtiapine  12.5 mg Per Tube QHS    Time spent on care of this patient: 25  mins   Mont[facility] Memorial HospitalMCCLUNG,Buna Cuppett T , MD   [facility] Office  [phone] Pager - Text Page per Loretha StaplerAmion as per below:  On-Call/Text Page:      [email or web address]      password [email or web address]  If 7PM-7AM, please contact night-coverage [email or web address] Password [email or web address] 03/29/2016, 2:49 PM   LOS: 33 days

## 2016-03-29 NOTE — Progress Notes (Signed)
Speech Language Pathology Treatment: [name] Speaking valve  Patient Details Name: Kendra Osborne MRN: [id number] DOB: 08/17/1946 Today's Date: 03/29/2016 Time: [id number] SLP Time Calculation (min) (ACUTE ONLY): 13 min  Assessment / Plan / Recommendation Clinical Impression  With assistance from phone interpreter, pt was seen for PMV and dysphagia treatment.  She was slightly agitated today; the interpreter relayed that pt c/o feeling nauseated and that she might vomit; basin provided but symptoms subsided; however, she refused PO trials.  Pt speaking with continued hoarse, low pitch but improved volume.  Talking about God watching over her, then getting drowsy and interpretor unable to understand speech.  Pt used valve for duration of session with excellent toleration - SP02, RR, HR remained stable throughout.  Valve removed when pt began to fall asleep.  Will continue efforts at PO trials and increased use of PMV (pt should use valve whenever staff is present to supervise.)   HPI HPI: 70 y.o. woman who recently moved from [name] (no known PMHx) who presented to ED with respiratory distress and lower extremity edema. Pt was intubated 3/17 and failed to wean, therefore trach placed. CXR 3/31 stable bibasilar atelectasis.       SLP Plan  Continue with current plan of care     Recommendations  Diet recommendations: NPO      Patient may use [name] Speech Valve: Intermittently with supervision PMSV Supervision: Full      Plan: Continue with current plan of care     GO              Missie Gehrig L. Samson Fredericouture, [place]MA CCC/SLP Pager [phone]   Blenda MountsCouture, Aydien Majette Laurice 03/29/2016, 1:03 PM

## 2016-03-30 LAB — GLUCOSE, CAPILLARY
GLUCOSE-CAPILLARY: 126 mg/dL — AB (ref 65–99)
GLUCOSE-CAPILLARY: 152 mg/dL — AB (ref 65–99)
GLUCOSE-CAPILLARY: 137 mg/dL — AB (ref 65–99)
GLUCOSE-CAPILLARY: 133 mg/dL — AB (ref 65–99)
Glucose-Capillary: 137 mg/dL — ABNORMAL HIGH (ref 65–99)
Glucose-Capillary: 138 mg/dL — ABNORMAL HIGH (ref 65–99)

## 2016-03-30 NOTE — Progress Notes (Signed)
Occupational Therapy Treatment Patient Details Name: Kendra Osborne MRN: [id number] DOB: 03/06/1946 Today's Date: 03/30/2016    History of present illness pt presents with Large thyroid mass/goiter and Respiratory Difficulties.  pt has underwent Total Thyroidectomy with goiter removal and has remained intubated with one failed extubation on 3/24.  [name] and only known medical hx is HTN and Goiter.     OT comments  Per son patient appears to be at or near baseline cognition. Pt demonstrates cognitive deficits by lack of awareness to admission reason and repeating the same statements over and over again. Pt demonstrates incr voice quality during session compared to 4/19 session. Son expressed concerns with Doff of PMV this session. Son reports "it feels stuck".   Follow Up Recommendations  CIR    Equipment Recommendations  Hospital bed;Wheelchair cushion (measurements OT);Wheelchair (measurements OT);3 in 1 bedside comode;Other (comment)    Recommendations for Other Services Rehab consult    Precautions / Restrictions Precautions Precautions: Fall Precaution Comments: trach collar, peg Restrictions Weight Bearing Restrictions: No       Mobility Bed Mobility Overal bed mobility: Needs Assistance Bed Mobility: Rolling;Sidelying to Sit Rolling: Min assist Sidelying to sit: Mod assist;+2 for physical assistance;+2 for safety/equipment;HOB elevated   Sit to supine: Max assist;+2 for physical assistance   General bed mobility comments: Pt required (A) to sequence task . pt (A) with log rolling at MOD (A) level  Transfers Overall transfer level: Needs assistance   Transfers: Sit to/from Stand Sit to Stand: +2 physical assistance;Mod assist         General transfer comment: pt pulling up on cross bar of stedy with pad used to extend hips for static standing. pt static standing with heavy anterior lean on bar for peri care    Balance    Sitting-balance support: Single extremity supported Sitting balance-Leahy Scale: Poor     Standing balance support: Bilateral upper extremity supported Standing balance-Leahy Scale: Poor                     ADL Overall ADL's : Needs assistance/impaired Eating/Feeding: NPO                             Toileting - Clothing Manipulation Details (indicate cue type and reason): total (A) for peri care. pt voiding bowel and unaware. Pt noted to have skin break down and what appears redness due to incontinence.      Functional mobility during ADLs: Moderate assistance;+2 for physical assistance General ADL Comments: Pt in chair on arrival and closing eyes at times. pt touching neck and needed cues not to grab at trach collar      Vision                     Perception     Praxis      Cognition   Behavior During Therapy: Flat affect Overall Cognitive Status: Difficult to assess Area of Impairment: Orientation;Attention;Memory;Safety/judgement;Awareness Orientation Level: Disoriented to;Place;Time;Situation Current Attention Level: Sustained Memory: Decreased short-term memory    Safety/Judgement: Decreased awareness of safety;Decreased awareness of deficits Awareness: Intellectual   General Comments: son reports pt appears to be close to cognition base line. Son reports she gets confused with phone interpreter . pt uncertain reason for admission. pt repeating statements over and over. OT asking son what pt states and son reports "she is just talking" Son at times  not directly translating to therapist. Son speaking with patient but does not fully disclose to staff direct translation.     Extremity/Trunk Assessment               Exercises General Exercises - Lower Extremity Ankle Circles/Pumps: AROM;Right;Left;10 reps Long Arc Quad: AROM;Strengthening;Both;10 reps Heel Slides: AROM;Strengthening;Both;5 reps   Shoulder Instructions        General Comments      Pertinent Vitals/ Pain       Pain Assessment: Faces Pain Score: 0-No pain Faces Pain Scale: Hurts even more Pain Location: pointing to lower quadrants Pain Intervention(s): Limited activity within patient's tolerance;Repositioned;Monitored during session  Home Living                                          Prior Functioning/Environment              Frequency Min 2X/week     Progress Toward Goals  OT Goals(current goals can now be found in the care plan section)  Progress towards OT goals: Progressing toward goals  Acute Rehab OT Goals Patient Stated Goal: unable to state (distracted, off topic) OT Goal Formulation: Patient unable to participate in goal setting Time For Goal Achievement: 04/04/16 Potential to Achieve Goals: Good ADL Goals Pt Will Perform Grooming: with min assist;standing Pt Will Perform Upper Body Bathing: with supervision;sitting Pt Will Perform Lower Body Bathing: with min assist;sit to/from stand Pt Will Perform Upper Body Dressing: with min assist;sitting Pt Will Perform Lower Body Dressing: with min assist;sit to/from stand Pt Will Transfer to Toilet: with min assist;ambulating;regular height toilet;bedside commode;grab bars Pt Will Perform Toileting - Clothing Manipulation and hygiene: with min assist;sit to/from stand Pt/caregiver will Perform Home Exercise Program: Increased strength;Both right and left upper extremity;With theraband;With Supervision;With written HEP provided  Plan Discharge plan needs to be updated    Co-evaluation                 End of Session Equipment Utilized During Treatment: Gait belt;Oxygen   Activity Tolerance Patient tolerated treatment well   Patient Left in bed;with call bell/phone within reach;with nursing/sitter in room;with restraints reapplied   Nurse Communication Mobility status;Precautions        Time: 1137-1207 OT Time Calculation (min): 30  min  Charges: OT General Charges $OT Visit: 1 Procedure OT Treatments $Self Care/Home Management : 8-22 mins $Therapeutic Activity: 8-22 mins  Boone Master B 03/30/2016, 1:38 PM  Mateo Flow   OTR/L Pager: [phone] Office: [phone] .

## 2016-03-30 NOTE — Progress Notes (Signed)
Instructed the son on trach suctioning. He was attentive and open to learning. However he did say that he hoped her trach would be removed before discharge.

## 2016-03-30 NOTE — Progress Notes (Signed)
Physical Therapy Treatment Patient Details Name: Kendra Osborne MRN: [id number] DOB: 05/07/1946 Today's Date: 03/30/2016    History of Present Illness pt presents with Large thyroid mass/goiter and Respiratory Difficulties.  pt has underwent Total Thyroidectomy with goiter removal and has remained intubated with one failed extubation on 3/24.  [name] and only known medical hx is HTN and Goiter.      PT Comments    Patient able to pivot OOB to recliner with use of bariatric stedy. Incr productive coughing with pt able to clear secretions orally (wearing PMV). Improving stamina and strength.  Follow Up Recommendations  CIR     Equipment Recommendations   (TBA--likely wheelchair)    Recommendations for Other Services       Precautions / Restrictions Precautions Precautions: Fall Precaution Comments:  (trach peg) Restrictions Weight Bearing Restrictions: No    Mobility  Bed Mobility Overal bed mobility: Needs Assistance Bed Mobility: Rolling;Sidelying to Sit Rolling: Min assist Sidelying to sit: Mod assist;+2 for physical assistance;+2 for safety/equipment;HOB elevated       General bed mobility comments: max cues (verbal by son and tactile by PT) for sequencing to roll to her Rt; +rail; incr time and cues for scoot to EOB--ultimately pt able to grab stedy and pull herself to EOB with minguard assist  Transfers Overall transfer level: Needs assistance   Transfers: Sit to/from Stand Sit to Stand: +2 physical assistance;Mod assist         General transfer comment: used bariatric stedy from EOB--pt wanting to prop her Rt forearm on "seat"/flap and required incr assist to weight shift to her left and allow seat to move under her for pivot; better stand from stedy and required control to sit in recliner (quick descent)  Ambulation/Gait                 Stairs            Wheelchair Mobility    Modified Rankin (Stroke Patients  Only)       Balance   Sitting-balance support: Single extremity supported Sitting balance-Leahy Scale: Poor     Standing balance support: Bilateral upper extremity supported Standing balance-Leahy Scale: Poor                      Cognition Arousal/Alertness: Awake/alert Behavior During Therapy: Flat affect Overall Cognitive Status: Difficult to assess     Current Attention Level: Sustained     Safety/Judgement: Decreased awareness of safety;Decreased awareness of deficits Awareness: Intellectual        Exercises General Exercises - Lower Extremity Ankle Circles/Pumps: AROM;Right;Left;10 reps Long Arc Quad: AROM;Strengthening;Both;10 reps Heel Slides: AROM;Strengthening;Both;5 reps    General Comments General comments (skin integrity, edema, etc.): Son present       Pertinent Vitals/Pain Pain Assessment: Faces Pain Score: 0-No pain Faces Pain Scale: Hurts even more Pain Location: pointing to lower quadrants Pain Intervention(s): Limited activity within patient's tolerance;Repositioned;Monitored during session    Home Living                      Prior Function            PT Goals (current goals can now be found in the care plan section) Acute Rehab PT Goals Patient Stated Goal: unable to state (distracted, off topic) Time For Goal Achievement: 04/11/16 Progress towards PT goals: Progressing toward goals    Frequency  Min 3X/week    PT  Plan Current plan remains appropriate    Co-evaluation             End of Session Equipment Utilized During Treatment: Gait belt;Oxygen Activity Tolerance: Patient tolerated treatment well Patient left: in chair;with call bell/phone within reach;with chair alarm set;with family/visitor present;with restraints reapplied (bil wrist restraints)     Time: 1610-9604 PT Time Calculation (min) (ACUTE ONLY): 47 min  Charges:  $Therapeutic Exercise: 8-22 mins $Therapeutic Activity: 23-37 mins                     G Codes:      Obie Kallenbach 2016/04/16, 11:54 AM Pager [phone]

## 2016-03-30 NOTE — Progress Notes (Signed)
[facility] TEAM 1 - Stepdown/ICU TEAM PROGRESS NOTE  Kendra Osborne ZSW:[id number] DOB: 07/10/1946 DOA: 02/24/2016 PCP: No PCP Per Patient  Admit HPI / Brief Narrative: 70 y/o woman recently moved from [place] (speaks Urdu) who presented to the ED with respiratory distress and LE edmea on 3/17. She has a Hx of a goiter as well as hypertension. It is not clear if the goiter was ever worked up or if she was on regular therapy for her hypertension.   HPI/Subjective: No acute distress.  Remains confused/agitated.  No family present at time of exam.  Having signif tracheal secretions which she appears to be having some difficulty coughing out.  Much improved after suctioned by MD.    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 - s/p tracheostomy  -Multifactorial to include element of pulmonary edema, viral bronchitis/pneumonitis, extrinsic airway compression, and oversedation -Minimize all pain medication, benzodiazepines, psychogenic medications  -4/17 trach team changed out the #6 XLT cuffed trach to a #6 XLT uncuffed - unable to downsize to #4 secondary to copious secretions -4/18 patient tolerating PMV -ongoing trach care per PCCM, who plan to leave trach in place for foreseeable future   Aspiration event  -Has been tx for aspiration pneumonitis - SLP following dysphagia - remains NPO at this time   Extrinsic Airway Compression Secondary to goiter -resolved S/P total thyroidectomy 3/21  Altered mental status -pt remains altered - B12 + folate normal - RPR and HIV negative - ammonia not signif elevated - ?ICU psychosis / sundowning - seroquel low dose QHS only - in that sx do not seem to be improving, I will obtain CT head to r/o subacute infarct/etc (MRI would require heavy sedation, which I wish to avoid)  HTN  -BP currently well controlled  Chronic grade 1 diastolic CHF Filed Weights   03/28/16 0600 03/29/16 0500 03/30/16 0500  Weight: 101.9 kg  (224 lb 10.4 oz) 101.6 kg (223 lb 15.8 oz) 97.3 kg (214 lb 8.1 oz)  net negative ~5L - appears to be stable - cont current medical tx - BP controlled   Acute Renal Failure  -Resolved  Recent Labs Lab 03/24/16 0943 03/25/16 0856 03/26/16 1248 03/27/16 1450 03/29/16 0343  CREATININE 5.27* 0.81 0.85 0.84 0.75   Dyspahgia -failed swallow study - cont PEG feeds - SLP following w/ [place]   Postoperative Hypothyroidism -S/P Total Thyroidectomy - previously normal TSH - cont synthroid - will need to check TSH in 6-8 weeks as adjustment of dose likely to be required   Obesity - Body mass index is 41.89 kg/(m^2).  Code Status: FULL Family Communication: no family present at time of exam Disposition Plan: SDU - presently no safe option for d/c as pt can't get 24hr trach care at home (she is very high risk for trach occlusion w/ secretions)  Consultants: ENT IR  PCCM  Procedures/Significant Events: 3/18 CTA of Chest > mild mediastinal enlargement 3/18 CT soft tissue Neck with contrast > multinodular goiter, meningioma 3/19 TTE EF 60%-65% - grade 1 diastolic dysfunction 3/21 Thyroidectomy 3/24 - Extubation & reintubation within 2 hours due to hypoxia & secretions 4/6  24 hr trach collar 4/6 IR GASTROSTOMY TUBE  4/7 tracheostomy tube change - old 6 cuffed proximal XLT shiley was removed with the old silk sutures, and a new 6 cuffless proximal XLT shiley was placed  4/8 transferred back to icu for resp distress/asp pna. Trach changed to #6 cuffed XLT at bedside. 4/12 trach collar 4/17 #  6 cuffed XLT change to #6 cuffless XLT  Antibiotics: Unasyn 3/24 > 4/3;4/8 > 4/18 Ancef 3/21 > 3/24 Clindamycin 3/21  Azithromycin 3/18 > 3/19 Rocephin 3/18 > 3/20  DVT prophylaxis: SCDs  Objective: Blood pressure 121/99, pulse 73, temperature 98.7 F (37.1 C), temperature source Oral, resp. rate 16, height 5' (1.524 m), weight 97.3 kg (214 lb 8.1 oz), SpO2 91 %.  Intake/Output Summary (Last  24 hours) at 03/30/16 1354 Last data filed at 03/30/16 0900  Gross per 24 hour  Intake   1850 ml  Output    350 ml  Net   1500 ml   Exam: General: No acute respiratory distress - alert but somewhat agitated  Lungs: course upper airway sounds - thick tan secretions suctioned from trach   Cardiovascular: Regular rate and rhythm - no M Abdomen: Nontender, obese, soft, bowel sounds positive, no rebound, no ascites, no appreciable mass Extremities: No significant cyanosis, clubbing, or edema bilateral lower extremities  Data Reviewed:  Basic Metabolic Panel:  Recent Labs Lab 03/24/16 0943  03/25/16 0856 03/26/16 0552 03/26/16 1248 03/27/16 0520 03/27/16 1450 03/28/16 0357 03/29/16 0343  NA 136  --  138  --  138  --  137  --  135  K 4.7  --  3.3*  --  3.2*  --  3.5  --  3.5  CL 100*  --  90*  --  89*  --  87*  --  86*  CO2 24  --  37*  --  40*  --  39*  --  38*  GLUCOSE 105*  --  142*  --  143*  --  129*  --  138*  BUN 110*  --  31*  --  35*  --  41*  --  29*  CREATININE 5.27*  --  0.81  --  0.85  --  0.84  --  0.75  CALCIUM 8.2*  --  8.5*  --  8.2*  --  8.1*  --  8.3*  MG  --   < > 1.7 2.9*  --  2.4 2.5* 2.3  --   < > = values in this interval not displayed.  CBC:  Recent Labs Lab 03/24/16 0943 03/25/16 1116 03/27/16 1450 03/29/16 0343  WBC 22.4* 10.1 12.0* 13.6*  NEUTROABS 19.1* 7.0  --   --   HGB 9.6* 10.1* 9.1* 9.2*  HCT 29.6* 34.0* 29.8* 30.7*  MCV 93.1 86.3 88.4 86.7  PLT 157 308 268 290    Liver Function Tests:  Recent Labs Lab 03/24/16 0943 03/25/16 0856 03/29/16 0343  AST 15 32 30  ALT 16 23 22   ALKPHOS 75 83 60  BILITOT 0.4 0.7 0.5  PROT 5.5* 7.7 6.9  ALBUMIN 1.7* 2.3* 2.0*   CBG:  Recent Labs Lab 03/29/16 1605 03/29/16 2052 03/30/16 0052 03/30/16 0357 03/30/16 0752  GLUCAP 147* 142* 137* 126* 152*   Studies:   Recent x-ray studies have been reviewed in detail by the Attending Physician  Scheduled Meds:  Scheduled Meds: .  bacitracin   Topical TID  . chlorhexidine  15 mL Mouth Rinse BID  . docusate  100 mg Oral BID  . [START ON 04/01/2016] fentaNYL  25 mcg Transdermal Q72H  . free water  200 mL Per Tube Q6H  . furosemide  60 mg Intravenous Q12H  . heparin subcutaneous  5,000 Units Subcutaneous 3 times per day  . levothyroxine  50 mcg Intravenous QAC breakfast  . pantoprazole  sodium  40 mg Per Tube Daily  . polyethylene glycol  17 g Per Tube Daily  . QUEtiapine  12.5 mg Per Tube QHS    Time spent on care of this patient: 25 mins   [facility] T , MD   [facility] Office  [phone] Pager - Text Page per Loretha Stapler as per below:  On-Call/Text Page:      [email or web address]      password [email or web address]  If 7PM-7AM, please contact night-coverage [email or web address] Password [email or web address] 03/30/2016, 1:54 PM   LOS: 34 days

## 2016-03-30 NOTE — Progress Notes (Signed)
Speech Language Pathology Treatment: Kendra Osborne Speaking valve  Patient Details Name: Kendra Osborne MRN: [id number] DOB: 05/24/1946 Today's Date: 03/30/2016 Time: 1035-1050 SLP Time Calculation (min) (ACUTE ONLY): 15 min  Assessment / Plan / Recommendation Clinical Impression  ST follow up for education re: passy Osborne care and use. Son present. SLP updated signs in room regarding need for removal of PMV if SOB, lethargic, has increased secretions, is sleeping, or is receiving breathing treatments. Education provided regarding cleaning of valve with mild soap and warm water, rinsing well, and allowing to air dry overnight (while pt is sleeping). Written information regarding cleaning valve posted in room. Tether was attached to valve and trach collar to reduce risk of losing valve, and son was educated regarding this addition. Son was provided with opportunity to ask questions. ST will continue to follow for education with son until discharge.   HPI HPI: 70 y.o. woman who recently moved from [name] (no known PMHx) who presented to ED with respiratory distress and lower extremity edema. Pt was intubated 3/17 and failed to wean, therefore trach placed. CXR 3/31 stable bibasilar atelectasis.       SLP Plan  Continue with current plan of care     Recommendations        Patient may use Passy-Osborne Speech Valve: During all waking hours (remove during sleep) PMSV Supervision: Intermittent      Oral Care Recommendations: Oral care QID Follow up Recommendations: 24 hour supervision/assistance Plan: Continue with current plan of care     GO                Leigh AuroraBueche, Lorelei Heikkila Brown 03/30/2016, 10:59 AM  Merlene Laughterelia B. Luciann Gossett, MSP, CCC-SLP [phone]

## 2016-03-30 NOTE — Care Management Note (Signed)
Case Management Note  Patient Details  Name: Kendra Osborne MRN: [id number] Date of Birth: 05/25/1946  Subjective/Objective:     Pt was living with son after arriving here from [name].  Son agreeable to caring for pt upon discharge, currently learning trach and PEG care.  Pt has no insurance and son will be responsible for trach supplies and tube feeding, is hopeful that pt will need neither when she is discharged.  Therapists are recommending CIR.                         Expected Discharge Plan:  IP Rehab Facility  Discharge planning Services  CM Consult  Status of Service:  In process, will continue to follow  Magdalene RiverMayo, Tabitha Riggins T, RN 03/30/2016, 3:08 PM

## 2016-03-30 NOTE — Progress Notes (Signed)
Respiratory care note- Attempted trach teaching with patient.  Pt unable to understand.  Booklet for trach teaching left at patient bedside.  No son in room at time of visit.  Will attempt to follow-up for trach teaching.  Do not feel that patient is capable of self care of trach.

## 2016-03-31 LAB — GLUCOSE, CAPILLARY
GLUCOSE-CAPILLARY: 117 mg/dL — AB (ref 65–99)
GLUCOSE-CAPILLARY: 125 mg/dL — AB (ref 65–99)
Glucose-Capillary: 123 mg/dL — ABNORMAL HIGH (ref 65–99)
Glucose-Capillary: 127 mg/dL — ABNORMAL HIGH (ref 65–99)
Glucose-Capillary: 134 mg/dL — ABNORMAL HIGH (ref 65–99)
Glucose-Capillary: 134 mg/dL — ABNORMAL HIGH (ref 65–99)

## 2016-03-31 NOTE — Progress Notes (Signed)
TEAM 1 - Stepdown/ICU TEAM PROGRESS NOTE  Lashann Hagg EAV:[id number] DOB: 08/17/1946 DOA: 02/24/2016 PCP: No PCP Per Patient  Admit HPI / Brief Narrative: 70 y/o woman recently moved from [place] (speaks Urdu) who presented to the ED with respiratory distress and LE edmea on 3/17. She has a Hx of a goiter as well as hypertension. It is not clear if the goiter was ever worked up or if she was on regular therapy for her hypertension.   HPI/Subjective: Pt is much more calm and interactive today.  I was able to speak w/ her via a telephone interpreter.  She denied pain or sob, but is quite distressed over her inability to speak w/o the PMV valve, and her inability to eat at present.    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 - s/p tracheostomy  -Multifactorial to include element of pulmonary edema, viral bronchitis/pneumonitis, extrinsic airway compression, and oversedation -Minimized all pain medication, benzodiazepines, psychogenic medications  -4/17 trach team changed out the #6 XLT cuffed trach to a #6 XLT uncuffed - unable to downsize to #4 secondary to copious secretions -4/18 patient tolerating PMV -ongoing trach care per PCCM, who plan to leave trach in place for foreseeable future   Aspiration event  -Has been tx for aspiration pneumonitis - SLP following dysphagia - remains NPO at this time w/ feeding via PEG   Extrinsic Airway Compression Secondary to goiter -resolved S/P total thyroidectomy 3/21  Altered mental status -appears to be clearing at this time - B12 + folate normal - RPR and HIV negative - ammonia not signif elevated - ?ICU psychosis / sundowning - seroquel low dose QHS only  HTN  -no change in tx plan today   Chronic grade 1 diastolic CHF Filed Weights   03/29/16 0500 03/30/16 0500 03/31/16 0520  Weight: 101.6 kg (223 lb 15.8 oz) 97.3 kg (214 lb 8.1 oz) 96.5 kg (212 lb 11.9 oz)  net negative ~6L - appears to  be stable - cont current medical tx   Acute Renal Failure (NO CKD) -Resolved  Dyspahgia -failed swallow study - cont PEG feeds - SLP following w/ [place] - hopeful that she may be able to begin diet soon   Postoperative Hypothyroidism -S/P Total Thyroidectomy - previously normal TSH - cont synthroid - will need to check TSH in 6-8 weeks as adjustment of dose likely to be required   Obesity - Body mass index is 41.55 kg/(m^2).  Code Status: FULL Family Communication: no family present at time of exam Disposition Plan: SDU - presently no safe option for d/c as pt can't get 24hr trach care at home (she is very high risk for trach occlusion w/ secretions)  Consultants: ENT IR  PCCM  Procedures/Significant Events: 3/18 CTA of Chest > mild mediastinal enlargement 3/18 CT soft tissue Neck with contrast > multinodular goiter, meningioma 3/19 TTE EF 60%-65% - grade 1 diastolic dysfunction 3/21 Thyroidectomy 3/24 - Extubation & reintubation within 2 hours due to hypoxia & secretions 4/6  24 hr trach collar 4/6 IR GASTROSTOMY TUBE  4/7 tracheostomy tube change - old 6 cuffed proximal XLT shiley was removed with the old silk sutures, and a new 6 cuffless proximal XLT shiley was placed  4/8 transferred back to icu for resp distress/asp pna. Trach changed to #6 cuffed XLT at bedside. 4/12 trach collar 4/17 #6 cuffed XLT change to #6 cuffless XLT  Antibiotics: Unasyn 3/24 > 4/3;4/8 > 4/18 Ancef 3/21 > 3/24 Clindamycin  3/21  Azithromycin 3/18 > 3/19 Rocephin 3/18 > 3/20  DVT prophylaxis: SCDs  Objective: Blood pressure 165/90, pulse 69, temperature 97.8 F (36.6 C), temperature source Axillary, resp. rate 18, height 5' (1.524 m), weight 96.5 kg (212 lb 11.9 oz), SpO2 100 %.  Intake/Output Summary (Last 24 hours) at 03/31/16 1551 Last data filed at 03/31/16 1100  Gross per 24 hour  Intake   1570 ml  Output   3350 ml  Net  -1780 ml   Exam: General: No acute respiratory distress  - more alert today  Lungs: course upper airway sounds - less secretions noted today  Cardiovascular: Regular rate and rhythm - no M Abdomen: Nontender, obese, soft, bowel sounds positive, no rebound, no ascites, no appreciable mass Extremities: No significant cyanosis, clubbing, edema bilateral lower extremities  Data Reviewed:  Basic Metabolic Panel:  Recent Labs Lab 03/25/16 0856 03/26/16 0552 03/26/16 1248 03/27/16 0520 03/27/16 1450 03/28/16 0357 03/29/16 0343  NA 138  --  138  --  137  --  135  K 3.3*  --  3.2*  --  3.5  --  3.5  CL 90*  --  89*  --  87*  --  86*  CO2 37*  --  40*  --  39*  --  38*  GLUCOSE 142*  --  143*  --  129*  --  138*  BUN 31*  --  35*  --  41*  --  29*  CREATININE 0.81  --  0.85  --  0.84  --  0.75  CALCIUM 8.5*  --  8.2*  --  8.1*  --  8.3*  MG 1.7 2.9*  --  2.4 2.5* 2.3  --     CBC:  Recent Labs Lab 03/25/16 1116 03/27/16 1450 03/29/16 0343  WBC 10.1 12.0* 13.6*  NEUTROABS 7.0  --   --   HGB 10.1* 9.1* 9.2*  HCT 34.0* 29.8* 30.7*  MCV 86.3 88.4 86.7  PLT 308 268 290    Liver Function Tests:  Recent Labs Lab 03/25/16 0856 03/29/16 0343  AST 32 30  ALT 23 22  ALKPHOS 83 60  BILITOT 0.7 0.5  PROT 7.7 6.9  ALBUMIN 2.3* 2.0*   CBG:  Recent Labs Lab 03/30/16 2001 03/31/16 03/31/16 0402 03/31/16 0740 03/31/16 1159  GLUCAP 133* 134* 117* 125* 134*   Studies:   Recent x-ray studies have been reviewed in detail by the Attending Physician  Scheduled Meds:  Scheduled Meds: . bacitracin   Topical TID  . chlorhexidine  15 mL Mouth Rinse BID  . docusate  100 mg Oral BID  . [START ON 04/01/2016] fentaNYL  25 mcg Transdermal Q72H  . free water  200 mL Per Tube Q6H  . furosemide  60 mg Intravenous Q12H  . heparin subcutaneous  5,000 Units Subcutaneous 3 times per day  . levothyroxine  50 mcg Intravenous QAC breakfast  . pantoprazole sodium  40 mg Per Tube Daily  . polyethylene glycol  17 g Per Tube Daily  . QUEtiapine   12.5 mg Per Tube QHS    Time spent on care of this patient: 35 mins   MCCLUNG,JEFFREY T , MD   [facility] Office  [phone] Pager - Text Page per Loretha StaplerAmion as per below:  On-Call/Text Page:      [email or web address]      password [email or web address]  If 7PM-7AM, please contact night-coverage [email or web address] Password [email or web address] 03/31/2016, 3:51 PM   LOS: 35 days

## 2016-04-01 LAB — COMPREHENSIVE METABOLIC PANEL
ALT: 22 U/L (ref 14–54)
ANION GAP: 12 (ref 5–15)
AST: 33 U/L (ref 15–41)
Albumin: 2.1 g/dL — ABNORMAL LOW (ref 3.5–5.0)
Alkaline Phosphatase: 65 U/L (ref 38–126)
BILIRUBIN TOTAL: 0.5 mg/dL (ref 0.3–1.2)
BUN: 41 mg/dL — AB (ref 6–20)
CHLORIDE: 87 mmol/L — AB (ref 101–111)
CO2: 36 mmol/L — ABNORMAL HIGH (ref 22–32)
Calcium: 8.5 mg/dL — ABNORMAL LOW (ref 8.9–10.3)
Creatinine, Ser: 0.87 mg/dL (ref 0.44–1.00)
GFR calc Af Amer: >60 mL/min (ref >60)
GFR calc non Af Amer: >60 mL/min (ref >60)
GLUCOSE: 154 mg/dL — AB (ref 65–99)
Potassium: 3.3 mmol/L — ABNORMAL LOW (ref 3.5–5.1)
SODIUM: 135 mmol/L (ref 135–145)
TOTAL PROTEIN: 7.3 g/dL (ref 6.5–8.1)

## 2016-04-01 LAB — GLUCOSE, CAPILLARY
GLUCOSE-CAPILLARY: 163 mg/dL — AB (ref 65–99)
GLUCOSE-CAPILLARY: 110 mg/dL — AB (ref 65–99)
Glucose-Capillary: 113 mg/dL — ABNORMAL HIGH (ref 65–99)
Glucose-Capillary: 132 mg/dL — ABNORMAL HIGH (ref 65–99)
Glucose-Capillary: 148 mg/dL — ABNORMAL HIGH (ref 65–99)

## 2016-04-01 LAB — CBC
HEMATOCRIT: 32.8 % — AB (ref 36.0–46.0)
HEMOGLOBIN: 9.7 g/dL — AB (ref 12.0–15.0)
MCH: 25.9 pg — ABNORMAL LOW (ref 26.0–34.0)
MCHC: 29.6 g/dL — AB (ref 30.0–36.0)
MCV: 87.5 fL (ref 78.0–100.0)
Platelets: 335 10*3/uL (ref 150–400)
RBC: 3.75 MIL/uL — ABNORMAL LOW (ref 3.87–5.11)
RDW: 20.1 % — AB (ref 11.5–15.5)
WBC: 12.9 10*3/uL — ABNORMAL HIGH (ref 4.0–10.5)

## 2016-04-01 MED ORDER — POTASSIUM CHLORIDE 20 MEQ/15ML (10%) PO SOLN
40.0000 meq | Freq: Once | ORAL | Status: AC
  Administered 2016-04-01: 40 meq
  Filled 2016-04-01: qty 30

## 2016-04-01 MED ORDER — FUROSEMIDE 10 MG/ML IJ SOLN
40.0000 mg | Freq: Two times a day (BID) | INTRAMUSCULAR | Status: DC
  Administered 2016-04-01 – 2016-04-02 (×2): 40 mg via INTRAVENOUS
  Filled 2016-04-01 (×2): qty 4

## 2016-04-01 NOTE — Progress Notes (Signed)
[facility] TEAM 1 - Stepdown/ICU TEAM PROGRESS NOTE  Analisa Sledd ZOX:[id number] DOB: 12-20-1945 DOA: 02/24/2016 PCP: No PCP Per Patient  Admit HPI / Brief Narrative: 70 y/o woman recently moved from [place] (speaks Urdu) who presented to the ED with respiratory distress and LE edmea on 3/17. She has a Hx of a goiter as well as hypertension. It is not clear if the goiter was ever worked up or if she was on regular therapy for her hypertension.   HPI/Subjective: Pt continues to improve in regard to MS.  She is interactive and oriented today.  Her son is at the bedside and translates for me.  She denies cp, n/v, or abdom pain.  She is anxious to be allowed to eat, and to have her trach removed.    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 - s/p tracheostomy  -Multifactorial to include element of pulmonary edema, viral bronchitis/pneumonitis, extrinsic airway compression, and oversedation -Minimized all pain medication, benzodiazepines, psychogenic medications  -4/17 trach team changed out the #6 XLT cuffed trach to a #6 XLT uncuffed - unable to downsize to #4 secondary to copious secretions -4/18 patient tolerating PMV -ongoing trach care per PCCM, who plan to leave trach in place for foreseeable future   Aspiration event  -Has been tx for aspiration pneumonitis - SLP following dysphagia - remains NPO at this time w/ feeding via PEG - hopeful for SLP re-eval tomorrow    Extrinsic Airway Compression Secondary to goiter -resolved S/P total thyroidectomy 3/21  Altered mental status -appears to be approaching baseline MS - B12 + folate normal - RPR and HIV negative - ammonia not signif elevated - ?ICU psychosis / sundowning - seroquel low dose QHS only  HTN  -no change in tx plan today   Chronic grade 1 diastolic CHF Filed Weights   03/30/16 0500 03/31/16 0520 04/01/16 0401  Weight: 97.3 kg (214 lb 8.1 oz) 96.5 kg (212 lb 11.9 oz) 96.9 kg (213 lb 10  oz)  net negative ~7L - appears to be stable - cont current medical tx   Acute Renal Failure (NO CKD) -Resolved  Dyspahgia -failed swallow study - cont PEG feeds - SLP following w/ [place] - hopeful that she may be able to begin diet soon   Postoperative Hypothyroidism -S/P Total Thyroidectomy - previously normal TSH - cont synthroid - will need to check TSH in 6-8 weeks as adjustment of dose likely to be required   Obesity - Body mass index is 41.72 kg/(m^2).  Code Status: FULL Family Communication: son present at time of exam Disposition Plan: SDU - presently no safe option for d/c as pt can't get 24hr trach care at home (she is very high risk for trach occlusion w/ secretions)  Consultants: ENT IR  PCCM  Procedures/Significant Events: 3/18 CTA of Chest > mild mediastinal enlargement 3/18 CT soft tissue Neck with contrast > multinodular goiter, meningioma 3/19 TTE EF 60%-65% - grade 1 diastolic dysfunction 3/21 Thyroidectomy 3/24 - Extubation & reintubation within 2 hours due to hypoxia & secretions 4/6  24 hr trach collar 4/6 IR GASTROSTOMY TUBE  4/7 tracheostomy tube change - old 6 cuffed proximal XLT shiley was removed with the old silk sutures, and a new 6 cuffless proximal XLT shiley was placed  4/8 transferred back to icu for resp distress/asp pna. Trach changed to #6 cuffed XLT at bedside. 4/12 trach collar 4/17 #6 cuffed XLT change to #6 cuffless XLT  Antibiotics: Unasyn 3/24 >  4/3;4/8 > 4/18 Ancef 3/21 > 3/24 Clindamycin 3/21  Azithromycin 3/18 > 3/19 Rocephin 3/18 > 3/20  DVT prophylaxis: SCDs  Objective: Blood pressure 138/65, pulse 71, temperature 98.7 F (37.1 C), temperature source Axillary, resp. rate 7, height 5' (1.524 m), weight 96.9 kg (213 lb 10 oz), SpO2 96 %.  Intake/Output Summary (Last 24 hours) at 04/01/16 1151 Last data filed at 04/01/16 0600  Gross per 24 hour  Intake   1445 ml  Output   1250 ml  Net    195 ml   Exam: General: No  acute respiratory distress - alert and appropriate Lungs: course upper airway sounds - CTA w/o wheeze or crackles   Cardiovascular: Regular rate and rhythm - no M or gallup  Abdomen: Nontender, obese, soft, bowel sounds positive, no rebound, no ascites, no appreciable mass Extremities: No significant cyanosis, clubbing, or edema bilateral lower extremities  Data Reviewed:  Basic Metabolic Panel:  Recent Labs Lab 03/26/16 0552 03/26/16 1248 03/27/16 0520 03/27/16 1450 03/28/16 0357 03/29/16 0343 04/01/16 0552  NA  --  138  --  137  --  135 135  K  --  3.2*  --  3.5  --  3.5 3.3*  CL  --  89*  --  87*  --  86* 87*  CO2  --  40*  --  39*  --  38* 36*  GLUCOSE  --  143*  --  129*  --  138* 154*  BUN  --  35*  --  41*  --  29* 41*  CREATININE  --  0.85  --  0.84  --  0.75 0.87  CALCIUM  --  8.2*  --  8.1*  --  8.3* 8.5*  MG 2.9*  --  2.4 2.5* 2.3  --   --     CBC:  Recent Labs Lab 03/27/16 1450 03/29/16 0343 04/01/16 0552  WBC 12.0* 13.6* 12.9*  HGB 9.1* 9.2* 9.7*  HCT 29.8* 30.7* 32.8*  MCV 88.4 86.7 87.5  PLT 268 290 335    Liver Function Tests:  Recent Labs Lab 03/29/16 0343 04/01/16 0552  AST 30 33  ALT 22 22  ALKPHOS 60 65  BILITOT 0.5 0.5  PROT 6.9 7.3  ALBUMIN 2.0* 2.1*   CBG:  Recent Labs Lab 03/31/16 1645 03/31/16 1938 04/01/16 0101 04/01/16 0404 04/01/16 0850  GLUCAP 127* 123* 113* 132* 148*   Studies:   Recent x-ray studies have been reviewed in detail by the Attending Physician  Scheduled Meds:  Scheduled Meds: . bacitracin   Topical TID  . chlorhexidine  15 mL Mouth Rinse BID  . docusate  100 mg Oral BID  . fentaNYL  25 mcg Transdermal Q72H  . free water  200 mL Per Tube Q6H  . furosemide  60 mg Intravenous Q12H  . heparin subcutaneous  5,000 Units Subcutaneous 3 times per day  . levothyroxine  50 mcg Intravenous QAC breakfast  . pantoprazole sodium  40 mg Per Tube Daily  . polyethylene glycol  17 g Per Tube Daily  .  QUEtiapine  12.5 mg Per Tube QHS    Time spent on care of this patient: 35 mins   MCCLUNG,JEFFREY T , MD   [facility] Office  [phone] Pager - Text Page per Loretha Stapler as per below:  On-Call/Text Page:      [email or web address]      password [email or web address]  If 7PM-7AM, please contact night-coverage [email or web address] Password [facility] 04/01/2016, 11:51 AM  LOS: 36 days

## 2016-04-01 NOTE — Plan of Care (Signed)
Problem: Pain Managment: Goal: General experience of comfort will improve Outcome: Progressing Discussed methods of communication for pain with the family and patient to assist her throughout the night.

## 2016-04-02 LAB — BASIC METABOLIC PANEL
ANION GAP: 10 (ref 5–15)
BUN: 40 mg/dL — ABNORMAL HIGH (ref 6–20)
CO2: 37 mmol/L — AB (ref 22–32)
Calcium: 8.6 mg/dL — ABNORMAL LOW (ref 8.9–10.3)
Chloride: 92 mmol/L — ABNORMAL LOW (ref 101–111)
Creatinine, Ser: 0.74 mg/dL (ref 0.44–1.00)
GFR calc Af Amer: >60 mL/min (ref >60)
GFR calc non Af Amer: >60 mL/min (ref >60)
GLUCOSE: 114 mg/dL — AB (ref 65–99)
POTASSIUM: 3.7 mmol/L (ref 3.5–5.1)
Sodium: 139 mmol/L (ref 135–145)

## 2016-04-02 LAB — GLUCOSE, CAPILLARY
GLUCOSE-CAPILLARY: 102 mg/dL — AB (ref 65–99)
GLUCOSE-CAPILLARY: 134 mg/dL — AB (ref 65–99)
GLUCOSE-CAPILLARY: 113 mg/dL — AB (ref 65–99)
GLUCOSE-CAPILLARY: 106 mg/dL — AB (ref 65–99)
Glucose-Capillary: 112 mg/dL — ABNORMAL HIGH (ref 65–99)
Glucose-Capillary: 147 mg/dL — ABNORMAL HIGH (ref 65–99)
Glucose-Capillary: 148 mg/dL — ABNORMAL HIGH (ref 65–99)

## 2016-04-02 MED ORDER — WHITE PETROLATUM GEL
Status: AC
  Administered 2016-04-02: 0.2
  Filled 2016-04-02: qty 1

## 2016-04-02 MED ORDER — FUROSEMIDE 40 MG PO TABS
40.0000 mg | ORAL_TABLET | Freq: Two times a day (BID) | ORAL | Status: DC
  Administered 2016-04-02 – 2016-04-09 (×15): 40 mg
  Filled 2016-04-02 (×15): qty 1

## 2016-04-02 NOTE — Progress Notes (Signed)
Speech Pathology  SLP following for speech and swallowing.  Will schedule pt for repeat MBS Tues, 4/25.  Kapri Nero L. Samson Fredericouture, [place] CCC/SLP Pager [phone]

## 2016-04-02 NOTE — Progress Notes (Signed)
Kendra Osborne is a 70 y.o. female patient who transferred  from [facility]  awake, alert  & orientated  X 3, Full Code, VSS - Blood pressure 145/87, pulse 72, temperature 98.6 F (37 C), temperature source Oral, resp. rate 18, height 5' (1.524 m), weight 97.6 kg (215 lb 2.7 oz), SpO2 92 %. 28% 5L trach collar, no c/o shortness of breath, no c/o chest pain, no distress noted.   IV site JXB:[id number]:with a transparent dsg that's clean dry and intact.  Allergies:  Not on File   Past Medical History  Diagnosis Date  . Hypertension   . Chronic diastolic CHF (congestive heart failure) (HCC)     Son aided in assessment translation.  Pt orientation to unit, room and routine. SR up x 2, fall risk assessment complete with Patient and family verbalizing understanding of risks associated with falls. Pt verbalizes an understanding of how to use the call bell and to call for help before getting out of bed.  Skin, clean-dry- intact without evidence of bruising, or skin tears.   No evidence of skin break down noted on exam. Sacral foam PTA is CD&I.     Will cont to monitor and assist as needed.  Elisha PonderFaucette, [name], RN 04/02/2016 10:06 PM

## 2016-04-02 NOTE — Progress Notes (Signed)
Nutrition Follow-up  DOCUMENTATION CODES:   Morbid obesity  INTERVENTION:    Continue Vital High Protein formula at goal rate of 55 ml/hr  Above TF regimen providing 1320 kcals, 116 gm protein, 1104 ml free water daily  NUTRITION DIAGNOSIS:   Inadequate oral intake related to inability to eat as evidenced by NPO status, ongoing  GOAL:   Provide needs based on ASPEN/SCCM guidelines, met  MONITOR:   TF tolerance, Labs, Weight trends, I & O's  ASSESSMENT:   70 y/o woman recently moved from [place] who presents with respiratory distress. 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  S/p trach 3/31 and PEG 4/6. Transferred out of 62M-[facility]U 4/13.  Patient continues on ATC. Currently receiving Vital High Protein formula at 55 ml/hr via PEG tube which is providing 1320 kcals, 116 gm protein, 1104 ml free water daily.  Free water flushes at 200 ml every 6 hours. S/p bedside swallow evaluation 4/18 -- SLP recommending continued NPO status.  Diet Order:   NPO  Skin:  Wound (see comment) (closed incision on neck)  Last BM:  4/14  Height:   Ht Readings from Last 1 Encounters:  03/22/16 5' (1.524 m)    Weight:   Wt Readings from Last 1 Encounters:  04/02/16 215 lb 2.7 oz (97.6 kg)    Ideal Body Weight:  45.45 kg  BMI:  Body mass index is 42.02 kg/(m^2).  Estimated Nutritional Needs:   Kcal:  1500-1600  Protein:  100-115 gm  Fluid:  >/= 1.5 L  EDUCATION NEEDS:   No education needs identified at this time  Arthur Holms, RD, LDN Pager #: [phone] After-Hours Pager #: [phone]

## 2016-04-02 NOTE — Progress Notes (Signed)
Physical Therapy Treatment Patient Details Name: Kendra Osborne MRN: [id number] DOB: [birth year] Today's Date: 04/02/2016    History of Present Illness pt presents with Large thyroid mass/goiter and Respiratory Difficulties.  pt has underwent Total Thyroidectomy with goiter removal and has remained intubated with one failed extubation on 3/24.  pt recently came to the [place] from [place] and only known medical hx is HTN and Goiter.      PT Comments    Patient very alert and following gestures/visual cues for bed exercises and then sitting EOB. Pt with incr coughing and unable to move secretions out. Put on her PMV and pt immediately able to cough secretions up to her mouth and self-suction them out using yaunkers. Pt with large BM while sitting and returned to bed for pericare.    Follow Up Recommendations  CIR (have signed off, ? Reassess)     Equipment Recommendations   (TBA--likely wheelchair)    Recommendations for Other Services       Precautions / Restrictions Precautions Precautions: Fall Precaution Comments:  (trach peg) Restrictions Weight Bearing Restrictions: No    Mobility  Bed Mobility Overal bed mobility: Needs Assistance Bed Mobility: Rolling;Sidelying to Sit;Sit to Supine Rolling: Min assist Sidelying to sit: Mod assist;HOB elevated   Sit to supine: Mod assist;+2 for physical assistance;+2 for safety/equipment   General bed mobility comments: pt following gestural/visual cues; using rails  Transfers                    Ambulation/Gait                 Stairs            Wheelchair Mobility    Modified Rankin (Stroke Patients Only)       Balance     Sitting balance-Leahy Scale: Fair Sitting balance - Comments: sat EOB ~10 minutes; pt using PMV and able to cough secretions into her mouth and suction herself                            Cognition Arousal/Alertness: Awake/alert Behavior During Therapy: WFL for tasks  assessed/performed Overall Cognitive Status: Difficult to assess                      Exercises General Exercises - Upper Extremity Elbow Flexion: Strengthening;Both;5 reps (PT manually resisting) Elbow Extension: Strengthening;Both;5 reps Digit Composite Flexion: AROM;Both;5 reps General Exercises - Lower Extremity Ankle Circles/Pumps: AROM;Right;Left;10 reps Quad Sets: AROM;Both;5 reps Long Arc Quad: AROM;Both;10 reps Heel Slides: AROM;Strengthening;Both;5 reps    General Comments        Pertinent Vitals/Pain Pain Assessment: Faces Faces Pain Scale: Hurts little more Pain Location: Unable to determine, but pt grimaces occasionally. Pain Intervention(s): Limited activity within patient's tolerance;Monitored during session;Repositioned    Home Living                      Prior Function            PT Goals (current goals can now be found in the care plan section) Acute Rehab PT Goals Patient Stated Goal: unable to state (distracted, off topic) Time For Goal Achievement: 04/11/16 Progress towards PT goals: Progressing toward goals    Frequency  Min 3X/week    PT Plan Current plan remains appropriate    Co-evaluation             End of Session Equipment Utilized  During Treatment: Oxygen Activity Tolerance: Patient tolerated treatment well Patient left: with call bell/phone within reach;in bed;with nursing/sitter in room;with SCD's reapplied (RN providing meds)     Time: [id number] PT Time Calculation (min) (ACUTE ONLY): 42 min  Charges:  $Therapeutic Exercise: 8-22 mins $Therapeutic Activity: 23-37 mins                    G Codes:      Gerome Kokesh 04/02/2016, 5:37 PM Pager [phone]

## 2016-04-02 NOTE — Progress Notes (Signed)
Kendra Osborne - Stepdown/ICU TEAM PROGRESS NOTE  Kendra Osborne ZOX:[id number] DOB: May 05, 1946 DOA: 02/24/2016 PCP: No PCP Per Patient  Admit HPI / Brief Narrative: 70 y/o woman recently moved from [place] (speaks Urdu) who presented to the ED with respiratory distress and LE edmea on 3/17. She has a Hx of a goiter as well as hypertension. It is not clear if the goiter was ever worked up or if she was on regular therapy for her hypertension.   HPI/Subjective: Pt has no now complaints today.  She is resting comfortably in bed.  She is asking for something to drink, and is motivated to be allowed to eat.   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 - s/p tracheostomy  -Multifactorial to include element of pulmonary edema, viral bronchitis/pneumonitis, extrinsic airway compression, and oversedation -minimized all pain medication, benzodiazepines, psychogenic medications  -4/17 trach team changed out the #6 XLT cuffed trach to a #6 XLT uncuffed - unable to downsize to #4 secondary to copious secretions -4/18 patient tolerating PMV -ongoing trach care per PCCM  Aspiration event  -Has been tx for aspiration pneumonitis - SLP following dysphagia - remains NPO at this time w/ feeding via PEG - I have asked for SLP to re-eval as I suspect she may be ready for oral intake  Extrinsic Airway Compression Secondary to goiter -resolved S/P total thyroidectomy 3/21  Altered mental status -appears to have recovered to baseline MS - B12 + folate normal - RPR and HIV negative - ammonia not signif elevated - ?ICU psychosis / sundowning - seroquel low dose QHS only for now   HTN  -BP reasonably well controlled  Chronic grade Osborne diastolic CHF Filed Weights   03/31/16 0520 04/01/16 0401 04/02/16 0320  Weight: 96.5 kg (212 lb 11.9 oz) 96.9 kg (213 lb 10 oz) 97.6 kg (215 lb 2.7 oz)  net negative ~6L - appears to be stable - cont current medical tx   Acute Renal  Failure (NO CKD) -Resolved  Dyspahgia -failed swallow study - cont PEG feeds - SLP consulted for re-eval - hopeful that she may be able to begin diet soon   Postoperative Hypothyroidism -S/P Total Thyroidectomy - previously normal TSH - cont synthroid - will need to check TSH in 6-8 weeks as adjustment of dose likely to be required   Obesity - Body mass index is 42.02 kg/(m^2).  Code Status: FULL Family Communication: no family present at time of exam today  Disposition Plan: stable for transfer to medical bed - ongong trach care per PCCM - only option for d/c is home as pt has no insurance coverage - PT/OT - must get up out of bed - hope to advance diet soon   Consultants: ENT IR  PCCM  Procedures/Significant Events: 3/18 CTA of Chest > mild mediastinal enlargement 3/18 CT soft tissue Neck with contrast > multinodular goiter, meningioma 3/19 TTE EF 60%-65% - grade Osborne diastolic dysfunction 3/21 Thyroidectomy 3/24 - Extubation & reintubation within 2 hours due to hypoxia & secretions 4/6  24 hr trach collar 4/6 IR GASTROSTOMY TUBE  4/7 tracheostomy tube change - old 6 cuffed proximal XLT shiley was removed with the old silk sutures, and a new 6 cuffless proximal XLT shiley was placed  4/8 transferred back to icu for resp distress/asp pna. Trach changed to #6 cuffed XLT at bedside. 4/12 trach collar 4/17 #6 cuffed XLT change to #6 cuffless XLT  Antibiotics: Unasyn 3/24 > 4/3;4/8 > 4/18 Ancef  3/21 > 3/24 Clindamycin 3/21  Azithromycin 3/18 > 3/19 Rocephin 3/18 > 3/20  DVT prophylaxis: SCDs  Objective: Blood pressure 139/75, pulse 67, temperature 97.4 F (36.3 C), temperature source Axillary, resp. rate 33, height 5' (Osborne.524 m), weight 97.6 kg (215 lb 2.7 oz), SpO2 99 %.  Intake/Output Summary (Last 24 hours) at 04/02/16 1425 Last data filed at 04/02/16 1408  Gross per 24 hour  Intake   2680 ml  Output   1651 ml  Net   1029 ml   Exam: General: No acute respiratory  distress - alert and communicative  Lungs: CTA th/o w/o wheeze or crackles   Cardiovascular: Regular rate and rhythm - no M  Abdomen: Nontender, obese, soft, bowel sounds positive, no rebound, no ascites, no appreciable mass Extremities: No significant cyanosis, clubbing, edema bilateral lower extremities  Data Reviewed:  Basic Metabolic Panel:  Recent Labs Lab 03/27/16 0520 03/27/16 1450 03/28/16 0357 03/29/16 0343 04/01/16 0552 04/02/16 0417  NA  --  137  --  135 135 139  K  --  3.5  --  3.5 3.3* 3.7  CL  --  87*  --  86* 87* 92*  CO2  --  39*  --  38* 36* 37*  GLUCOSE  --  129*  --  138* 154* 114*  BUN  --  41*  --  29* 41* 40*  CREATININE  --  0.84  --  0.75 0.87 0.74  CALCIUM  --  8.Osborne*  --  8.3* 8.5* 8.6*  MG 2.4 2.5* 2.3  --   --   --     CBC:  Recent Labs Lab 03/27/16 1450 03/29/16 0343 04/01/16 0552  WBC 12.0* 13.6* 12.9*  HGB 9.Osborne* 9.2* 9.7*  HCT 29.8* 30.7* 32.8*  MCV 88.4 86.7 87.5  PLT 268 290 335    Liver Function Tests:  Recent Labs Lab 03/29/16 0343 04/01/16 0552  AST 30 33  ALT 22 22  ALKPHOS 60 65  BILITOT 0.5 0.5  PROT 6.9 7.3  ALBUMIN 2.0* 2.Osborne*   CBG:  Recent Labs Lab 04/01/16 1950 04/02/16 0039 04/02/16 0352 04/02/16 0727 04/02/16 1142  GLUCAP 147* 112* 102* 134* 148*   Studies:   Recent x-ray studies have been reviewed in detail by the Attending Physician  Scheduled Meds:  Scheduled Meds: . bacitracin   Topical TID  . chlorhexidine  15 mL Mouth Rinse BID  . docusate  100 mg Oral BID  . fentaNYL  25 mcg Transdermal Q72H  . free water  200 mL Per Tube Q6H  . furosemide  40 mg Intravenous Q12H  . heparin subcutaneous  5,000 Units Subcutaneous 3 times per day  . levothyroxine  50 mcg Intravenous QAC breakfast  . pantoprazole sodium  40 mg Per Tube Daily  . polyethylene glycol  17 g Per Tube Daily  . QUEtiapine  12.5 mg Per Tube QHS    Time spent on care of this patient: 25 mins   [facility] Long Term CareMCCLUNG,Melanie Pellot T , MD   [facility] Office  [phone] Pager - Text Page per Loretha StaplerAmion as per below:  On-Call/Text Page:      [email or web address]      password [email or web address]  If 7PM-7AM, please contact night-coverage [email or web address] Password [email or web address] 04/02/2016, 2:25 PM   LOS: 37 days

## 2016-04-02 NOTE — Progress Notes (Signed)
Patient ID: Kendra Osborne, female   DOB: 05/04/1946, 70 y.o.   MRN: [id number] Trach care per ENT Call if needed  Mcarthur RossettiDaniel J. Tyson AliasFeinstein, MD, FACP Pgr: [phone] [facility]

## 2016-04-02 NOTE — Progress Notes (Signed)
PULMONARY / CRITICAL CARE MEDICINE    Name: Kendra Osborne MRN: [id number] DOB: 07/02/1946    ADMISSION DATE:  02/24/2016  PRIMARY SERVICE: PCCM  CHIEF COMPLAINT:  Dyspnea  BRIEF PATIENT DESCRIPTION: 70 y/o woman [name] who presents with respiratory distress. She presented to the ED on 3/17 with complaints of dyspnea, and lower extremity edema. History is limited due to language / culture barriers, but she has a PMHx of a known goiter that has been present for years, as well as possibly hypertension. It is not clear if the goiter was ever worked up or if she ever was on regular therapy for her hypertension. Her Nephew and son (who speaks little [place]) report that she been having a cough for a few days as well as some fevers and chills. Had aspiration event after trach change and was placed back on cuffed trach with vent, now out to SDU.    SUBJECTIVE:   On 28% ATC, tolerating PMV with SLP.  Pending MBS on 4/25 per SLP.  #6 XLT uncuffed trach.     VITAL SIGNS: Temp:  [97.4 F (36.3 C)-98.7 F (37.1 C)] 97.4 F (36.3 C) (04/24 1144) Pulse Rate:  [63-76] 63 (04/24 1500) Resp:  [11-33] 15 (04/24 1500) BP: (110-177)/(59-98) 139/75 mmHg (04/24 1500) SpO2:  [92 %-100 %] 98 % (04/24 1500) FiO2 (%):  [28 %] 28 % (04/24 1500) Weight:  [215 lb 2.7 oz (97.6 kg)] 215 lb 2.7 oz (97.6 kg) (04/24 0320)   HEMODYNAMICS:     VENTILATOR SETTINGS: Vent Mode:  [-]  FiO2 (%):  [28 %] 28 %  INTAKE / OUTPUT: Intake/Output      04/23 0701 - 04/24 0700 04/24 0701 - 04/25 0700   P.O. 0    I.V. (mL/kg) 450 (4.6) 70 (0.7)   NG/GT 1775 385   Total Intake(mL/kg) 2225 (22.8) 455 (4.7)   Urine (mL/kg/hr) 1775 (0.8) 400 (0.5)   Stool 0 (0) 1 (0)   Total Output 1775 401   Net +450 +54        Stool Occurrence  1 x    PHYSICAL EXAMINATION: General:  Obese female in NAD on ATC Integument:  Warm & dry. No rash on exposed skin. HEENT:  #6 XLT uncuffed trach in place, + creamy white  secretions, poor dentition.  Cardiovascular:  s1s2 RRR Pulmonary:  Non-labored, lungs bilaterally coarse, scattered rhonchi Abdomen: Soft, Normal bowel sounds. Nondistended. PEG. Some facial grimace with abd palpation Neurological: Spontaneously moving all 4 extremities. Eyes open. Appears grossly nonfocal. Follows simple gestures ie. take a big breath with prompting.  Some broken english  LABS:  CBC  Recent Labs Lab 03/27/16 1450 03/29/16 0343 04/01/16 0552  WBC 12.0* 13.6* 12.9*  HGB 9.1* 9.2* 9.7*  HCT 29.8* 30.7* 32.8*  PLT 268 290 335   BMET  Recent Labs Lab 03/29/16 0343 04/01/16 0552 04/02/16 0417  NA 135 135 139  K 3.5 3.3* 3.7  CL 86* 87* 92*  CO2 38* 36* 37*  BUN 29* 41* 40*  CREATININE 0.75 0.87 0.74  GLUCOSE 138* 154* 114*   Electrolytes  Recent Labs Lab 03/27/16 0520  03/27/16 1450 03/28/16 0357 03/29/16 0343 04/01/16 0552 04/02/16 0417  CALCIUM  --   < > 8.1*  --  8.3* 8.5* 8.6*  MG 2.4  --  2.5* 2.3  --   --   --   < > = values in this interval not displayed.   Imaging No results  found.    SIGNIFICANT EVENTS: 3/17 - Admit 3/21 - Thyroidectomy 3/24 - Extubation & reintubation within 2 hours due to hypoxia & secretions 4/06 -  24 hr trach collar 4/07- Trach change by ENT to 6 uncuffed proximal XLT 4/08 - Transferred back to ICU for resp distress/asp pna. Trach changed to #6 cuffed XLT at bedside.  4/12 - Trach collar 4/17 - Changed trach to 6 cuffless 4/24 - Tolerating 28% ATC  STUDIES:  CT-A of Chest >> mild mediastinal enlargement CT of the Neck with contrast >> multinodular goiter, meningioma Port CXR 3/22 >>   ETT & CVL in good position. Blunting bilateral costophrenic angles suggestive of effusions. Bilateral hilar fullness & interstitial prominence with low lung volumes.  Port CXR 3/25 >> Silhouetting of the left hemidiaphragm. Platelike atelectasis right lower lung. Endotracheal tube in good position. Ort CXR 4/8 >> incr  bibasilar atelectasis 4/12 pCXR >> persistent elevation of right hemidiaphragm, bibasilar atelectasis vs infltrate  LINES / TUBES: OETT 7.5 3/17 - 3/24; 7.0 3/24>>>3/31 Trach (ENT) 3/31 >> L Subclavian CVL 3/21 >> L Port 3/21>>> OGT 3/21 - 3/24; 3/24 >> Foley 3/18 >> L Radial Art Line 3/21 - 3/23 R & L Neck Drains 3/21 - ???  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 S/P total thyroidectomy 3/21. Elevated Right Hemidiaphragm  Dyspahgia  Tracheostomy Status - #6 cuffless trach since 4/17  Plan: RN/RT to teach family re: trach care. Patient will not be able to be placed in a facility.   Work toward decannulation once more mobile - PATIENT NEEDS TO BE OUT OF BED TWICE DAILY!!! Push SLP efforts for PMV valve MBS pending 4/25 >> Consider downsize to #4 cuffless trach 4/26 pending review of secretions / status ATC 28%, wean to room air for sats > 91%  Push pulmonary hygiene - mobilize, deep breathing etc   Canary Brim, NP-C [facility] Pgr: [phone] or if no answer [phone] 04/02/2016, 3:32 PM      STAFF NOTE: I, Rory Percy, MD FACP have personally reviewed patient's available data, including medical history, events of note, physical examination and test results as part of my evaluation. I have discussed with resident/NP and other care providers such as pharmacist, RN and RRT. In addition, I personally evaluated patient and elicited key findings AV:[id number], calm, mild coarse BS, strong cough, trach clean, no distress, no new  films, trach was placed post goiter / thyroidectomy, she is on 6 , secretions were high 48 hrs ago , but improved now, if secretions are improved on wed , would changer to 4 cuffless which would facilitate swallowng and pmv improvement, need more PT efforts, would discuss with Dr Emeline Darling about decannulation candidacy, keep pushing more PMV all day, await swallow evaluation that wil be done today  Mcarthur Rossetti. Tyson Alias, MD, FACP Pgr: [phone] [facility] 04/02/2016 4:12 PM

## 2016-04-03 ENCOUNTER — Inpatient Hospital Stay (HOSPITAL_COMMUNITY): Payer: Medicaid Other

## 2016-04-03 LAB — GLUCOSE, CAPILLARY
GLUCOSE-CAPILLARY: 111 mg/dL — AB (ref 65–99)
GLUCOSE-CAPILLARY: 127 mg/dL — AB (ref 65–99)
GLUCOSE-CAPILLARY: 128 mg/dL — AB (ref 65–99)
GLUCOSE-CAPILLARY: 116 mg/dL — AB (ref 65–99)
GLUCOSE-CAPILLARY: 164 mg/dL — AB (ref 65–99)

## 2016-04-03 MED ORDER — SIMETHICONE 40 MG/0.6ML PO SUSP
40.0000 mg | Freq: Four times a day (QID) | ORAL | Status: DC
  Administered 2016-04-03 – 2016-04-04 (×7): 40 mg
  Filled 2016-04-03 (×9): qty 0.6

## 2016-04-03 MED ORDER — RESOURCE THICKENUP CLEAR PO POWD
ORAL | Status: DC | PRN
  Filled 2016-04-03 (×2): qty 125

## 2016-04-03 NOTE — Progress Notes (Signed)
MBSS complete. Full report located under chart review in imaging section. Leyna Vanderkolk, MA CCC-SLP [phone]  

## 2016-04-03 NOTE — Progress Notes (Signed)
PULMONARY / CRITICAL CARE MEDICINE    Name: Kendra Osborne MRN: [id number] DOB: 03/23/1946    ADMISSION DATE:  02/24/2016  PRIMARY SERVICE: PCCM  CHIEF COMPLAINT:  Dyspnea  BRIEF PATIENT DESCRIPTION: 70 y/o woman recently moved from [place] who presents with respiratory distress. She presented to the ED on 3/17 with complaints of dyspnea, and lower extremity edema. History is limited due to language / culture barriers, but she has a PMHx of a known goiter that has been present for years, as well as possibly hypertension. It is not clear if the goiter was ever worked up or if she ever was on regular therapy for her hypertension. Her Nephew and son (who speaks little [place]) report that she been having a cough for a few days as well as some fevers and chills. Had aspiration event after trach change and was placed back on cuffed trach with vent, now out to SDU.    SUBJECTIVE:   On 28% ATC, tolerating PMV with SLP.  MBS on 4/25 per SLP.  #6 XLT uncuffed trach.  No events overnight.   VITAL SIGNS: Temp:  [97.8 F (36.6 C)-98.6 F (37 C)] 97.8 F (36.6 C) (04/25 0656) Pulse Rate:  [62-72] 62 (04/25 0812) Resp:  [12-33] 18 (04/25 0812) BP: (139-149)/(56-91) 140/56 mmHg (04/25 0503) SpO2:  [92 %-100 %] 96 % (04/25 0812) FiO2 (%):  [28 %] 28 % (04/25 0812) Weight:  [93.8 kg (206 lb 12.7 oz)] 93.8 kg (206 lb 12.7 oz) (04/25 0500)   HEMODYNAMICS:     VENTILATOR SETTINGS: Vent Mode:  [-]  FiO2 (%):  [28 %] 28 %  INTAKE / OUTPUT: Intake/Output      04/24 0701 - 04/25 0700 04/25 0701 - 04/26 0700   P.O.     I.V. (mL/kg) 130 (1.4)    NG/GT 1115    Total Intake(mL/kg) 1245 (13.3)    Urine (mL/kg/hr) 1550 (0.7) 550 (0.9)   Stool 3 (0) 1 (0)   Total Output 1553 551   Net -308 -551        Stool Occurrence 1 x     PHYSICAL EXAMINATION: General:  Obese female in NAD on ATC Integument:  Warm & dry. No rash on exposed skin. HEENT:  #6 XLT uncuffed trach in place, poor dentition.   Cardiovascular:  s1s2 RRR Pulmonary:  Non-labored, lungs bilaterally coarse, scattered rhonchi Abdomen: Soft, Normal bowel sounds. Nondistended. PEG. Some facial grimace with abd palpation Neurological: Spontaneously moving all 4 extremities. Eyes open. Appears grossly nonfocal. Follows simple gestures ie. take a big breath with prompting.  Some broken english  LABS:  CBC  Recent Labs Lab 03/27/16 1450 03/29/16 0343 04/01/16 0552  WBC 12.0* 13.6* 12.9*  HGB 9.1* 9.2* 9.7*  HCT 29.8* 30.7* 32.8*  PLT 268 290 335   BMET  Recent Labs Lab 03/29/16 0343 04/01/16 0552 04/02/16 0417  NA 135 135 139  K 3.5 3.3* 3.7  CL 86* 87* 92*  CO2 38* 36* 37*  BUN 29* 41* 40*  CREATININE 0.75 0.87 0.74  GLUCOSE 138* 154* 114*   Electrolytes  Recent Labs Lab 03/27/16 1450 03/28/16 0357 03/29/16 0343 04/01/16 0552 04/02/16 0417  CALCIUM 8.1*  --  8.3* 8.5* 8.6*  MG 2.5* 2.3  --   --   --      Imaging No results found.    SIGNIFICANT EVENTS: 3/17 - Admit 3/21 - Thyroidectomy 3/24 - Extubation & reintubation within 2 hours due to hypoxia & secretions 4/06 -  24 hr trach collar 4/07- Trach change by ENT to 6 uncuffed proximal XLT 4/08 - Transferred back to ICU for resp distress/asp pna. Trach changed to #6 cuffed XLT at bedside.  4/12 - Trach collar 4/17 - Changed trach to 6 cuffless 4/24 - Tolerating 28% ATC  STUDIES:  CT-A of Chest >> mild mediastinal enlargement CT of the Neck with contrast >> multinodular goiter, meningioma Port CXR 3/22 >>   ETT & CVL in good position. Blunting bilateral costophrenic angles suggestive of effusions. Bilateral hilar fullness & interstitial prominence with low lung volumes.  Port CXR 3/25 >> Silhouetting of the left hemidiaphragm. Platelike atelectasis right lower lung. Endotracheal tube in good position. Ort CXR 4/8 >> incr bibasilar atelectasis 4/12 pCXR >> persistent elevation of right hemidiaphragm, bibasilar atelectasis vs  infltrate  LINES / TUBES: OETT 7.5 3/17 - 3/24; 7.0 3/24>>>3/31 Trach (ENT) 3/31 >> L Subclavian CVL 3/21 >> L Port 3/21>>> OGT 3/21 - 3/24; 3/24 >> Foley 3/18 >> L Radial Art Line 3/21 - 3/23 R & L Neck Drains 3/21 - ???  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 S/P total thyroidectomy 3/21. Elevated Right Hemidiaphragm  Dyspahgia  Tracheostomy Status - #6 cuffless trach since 4/17  Plan: RN/RT to teach family re: trach care. Patient will not be able to be placed in a facility.   Discussed with respiratory, only option for downsize is 4 cuffless shiley (no XLT present) in AM if passes swallow evaluation 4/25. Push SLP efforts for PMV valve MBS pending 4/25 >> ATC 28%, wean to room air for sats > 91%  Push pulmonary hygiene - mobilize, deep breathing etc  PCCM will evaluate again AM regarding trach change.  Alyson ReedyWesam G. Yacoub, M.D. [facility]. Pager: [phone]. After hours pager: [phone].

## 2016-04-03 NOTE — Progress Notes (Signed)
Progress Note    Kendra Osborne  ZOX:[id number] DOB: 01-08-1946  DOA: 02/24/2016 PCP: No PCP Per Patient   Outpatient Specialists:   None.   Brief Narrative:   Kendra Osborne is an 70 y.o. female with a PMH of goiter and hypertension who recently moved here from [place] presented to the hospital on 02/24/16 with respiratory distress in the setting of a few day history of cough, fever and chills. She was intubated on admission. 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 She was extubated and reintubated within 2 hours on 03/02/16 secondary to hypoxia and increased secretions. She underwent placement of a tracheostomy tube 03/09/16 and remained on the ventilator until 03/15/16 when she was weaned to a 24-hour trach collar. Her tracheostomy was changed by ENT 03/16/16. She developed worsening respiratory distress and aspiration pneumonia on 03/17/16 and required ongoing ventilator support. Transition to a trach collar on 03/21/16.  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 Sedating medications have been minimized. She completed a course of antibiotics on 03/02/16. She continues on a trach collar and pulmonology continues to follow. On 28% ATC, tolerating PMV with SLP.  Aspiration event  Has been tx for aspiration pneumonitis - SLP following dysphagia - remains NPO at this time w/ feeding via PEG - SLP to reevaluate for readiness of oral feeding.  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  Altered mental status Back to baseline. B12 + folate normal - RPR and HIV negative - ammonia not signif elevated - ?ICU psychosis / sundowning - seroquel  low dose QHS only for now.   HTN  BP reasonably well controlled.  Chronic grade 1 diastolic CHF Continue to monitor fluid volume status. Continue Lasix 40 mg twice a day.  Acute Renal Failure (NO CKD) Resolved.  Dyspahgia PEG tube placed 03/15/16. Has been managed with tube feeding via PEG. Speech therapy to reevaluate.  Postoperative Hypothyroidism S/P Total Thyroidectomy - previously normal TSH. Continue Synthroid. Recheck TSH in 6-8 weeks as adjustment of dose likely to be required .  Obesity - Body mass index is 42.02 kg/(m^2).      Family Communication/Anticipated D/C date and plan/Code Status   DVT prophylaxis: Heparin ordered. Code Status: Full Code.  Family Communication: No family currently at the bedside. Disposition Plan: ? SNF versus home with family in a few days.   Medical Consultants:    ENT  Pulmonology  Interventional Radiology   Procedures:   02/26/16 TTE EF 60%-65% - grade 1 diastolic dysfunction 02/28/16 Thyroidectomy 3/24 17 - Extubation & reintubation within 2 hours due to hypoxia & secretions 03/15/16 24 hr trach collar 03/15/16 IR GASTROSTOMY TUBE  03/16/16 tracheostomy tube change - old 6 cuffed proximal XLT shiley was removed with the old silk sutures, and a new 6 cuffless proximal XLT shiley was placed  03/17/16 transferred back to icu for resp distress/asp pna. Trach changed to #6 cuffed XLT at bedside. 03/21/16 trach collar 03/26/16 #6 cuffed XLT change to #6 cuffless XLT  Anti-Infectives:    Unasyn 3/24 > 4/3;4/8 > 4/18  Ancef 3/21 > 3/24  Clindamycin 3/21   Azithromycin 3/18 > 3/19  Rocephin 3/18 > 3/20  Subjective:    Kendra Osborne reports "gas".  Has a moist cough  but breathing is unlabored.  No complaints of pain.  No vomiting.  Objective:    Filed Vitals:   04/03/16 0500 04/03/16 0503 04/03/16 0656 04/03/16 0812  BP:  140/56    Pulse:  64  62  Temp:   97.8 F (36.6 C)   TempSrc:   Axillary   Resp:  18  18    Height:      Weight: 93.8 kg (206 lb 12.7 oz)     SpO2:  100%  96%    Intake/Output Summary (Last 24 hours) at 04/03/16 0841 Last data filed at 04/03/16 0504  Gross per 24 hour  Intake   1180 ml  Output   1553 ml  Net   -373 ml   Filed Weights   04/01/16 0401 04/02/16 0320 04/03/16 0500  Weight: 96.9 kg (213 lb 10 oz) 97.6 kg (215 lb 2.7 oz) 93.8 kg (206 lb 12.7 oz)    Exam: General exam: Appears calm and comfortable.  Respiratory system: Occasional rhonchi. Respiratory effort normal. Trach in place with trach collar on. Cardiovascular system: S1 & S2 heard, RRR. No JVD, murmurs, rubs, gallops or clicks. No pedal edema. Gastrointestinal system: Abdomen is nondistended, soft and nontender. No organomegaly or masses felt. Normal bowel sounds heard. Central nervous system: Alert and oriented. No focal neurological deficits. Extremities: Symmetric 4 x 5 power. No clubbing, edema, or cyanosis. Skin: No rashes, lesions or ulcers Psychiatry: Judgement and insight appear normal. Mood & affect appropriate.   Data Reviewed:   I have personally reviewed following labs and imaging studies:  Labs: Basic Metabolic Panel:  Recent Labs Lab 03/27/16 1450 03/28/16 0357 03/29/16 0343 04/01/16 0552 04/02/16 0417  NA 137  --  135 135 139  K 3.5  --  3.5 3.3* 3.7  CL 87*  --  86* 87* 92*  CO2 39*  --  38* 36* 37*  GLUCOSE 129*  --  138* 154* 114*  BUN 41*  --  29* 41* 40*  CREATININE 0.84  --  0.75 0.87 0.74  CALCIUM 8.1*  --  8.3* 8.5* 8.6*  MG 2.5* 2.3  --   --   --    GFR Estimated Creatinine Clearance: 68.9 mL/min (by C-G formula based on Cr of 0.74). Liver Function Tests:  Recent Labs Lab 03/29/16 0343 04/01/16 0552  AST 30 33  ALT 22 22  ALKPHOS 60 65  BILITOT 0.5 0.5  PROT 6.9 7.3  ALBUMIN 2.0* 2.1*    Recent Labs Lab 03/29/16 0345  AMMONIA 41*   CBC:  Recent Labs Lab 03/27/16 1450 03/29/16 0343 04/01/16 0552  WBC 12.0* 13.6* 12.9*  HGB 9.1* 9.2*  9.7*  HCT 29.8* 30.7* 32.8*  MCV 88.4 86.7 87.5  PLT 268 290 335   CBG:  Recent Labs Lab 04/02/16 1142 04/02/16 1633 04/02/16 1940 04/03/16 0153 04/03/16 0750  GLUCAP 148* 113* 106* 111* 127*   Urine analysis:    Component Value Date/Time   COLORURINE YELLOW 02/25/2016 0124   APPEARANCEUR CLEAR 02/25/2016 0124   LABSPEC 1.046* 02/25/2016 0124   PHURINE 5.5 02/25/2016 0124   GLUCOSEU NEGATIVE 02/25/2016 0124   HGBUR NEGATIVE 02/25/2016 0124   BILIRUBINUR NEGATIVE 02/25/2016 0124   KETONESUR NEGATIVE 02/25/2016 0124   PROTEINUR 100* 02/25/2016 0124   NITRITE NEGATIVE 02/25/2016 0124   LEUKOCYTESUR NEGATIVE 02/25/2016 0124   Microbiology No results found for this or any previous visit (from the past 240 hour(s)).  Radiology: No results found.  Medications:   .  bacitracin   Topical TID  . chlorhexidine  15 mL Mouth Rinse BID  . docusate  100 mg Oral BID  . free water  200 mL Per Tube Q6H  . furosemide  40 mg Per Tube BID  . heparin subcutaneous  5,000 Units Subcutaneous 3 times per day  . levothyroxine  50 mcg Intravenous QAC breakfast  . pantoprazole sodium  40 mg Per Tube Daily  . polyethylene glycol  17 g Per Tube Daily  . QUEtiapine  12.5 mg Per Tube QHS   Continuous Infusions: . sodium chloride 10 mL/hr at 04/02/16 2158  . feeding supplement (VITAL HIGH PROTEIN) 1,000 mL (04/02/16 1539)    Time spent: 35 minutes.  The patient is medically complex with multiple co-morbidities and is at high risk for clinical deterioration and requires high complexity decision making.    LOS: 38 days   Kendra Osborne  [facility] Pager [phone]. If unable to reach me by pager, please call my cell phone at [phone].  *Please refer to [email or web address], password [email or web address] to get updated schedule on who will round on this patient, as hospitalists switch teams weekly. If 7PM-7AM, please contact night-coverage at [email or web address], password [email or web address] for any overnight needs.  04/03/2016,  8:41 AM

## 2016-04-04 LAB — GLUCOSE, CAPILLARY
GLUCOSE-CAPILLARY: 106 mg/dL — AB (ref 65–99)
GLUCOSE-CAPILLARY: 168 mg/dL — AB (ref 65–99)
Glucose-Capillary: 117 mg/dL — ABNORMAL HIGH (ref 65–99)
Glucose-Capillary: 119 mg/dL — ABNORMAL HIGH (ref 65–99)
Glucose-Capillary: 153 mg/dL — ABNORMAL HIGH (ref 65–99)
Glucose-Capillary: 118 mg/dL — ABNORMAL HIGH (ref 65–99)

## 2016-04-04 MED ORDER — LEVOTHYROXINE SODIUM 100 MCG PO TABS
100.0000 ug | ORAL_TABLET | Freq: Every day | ORAL | Status: DC
  Administered 2016-04-05 – 2016-04-09 (×5): 100 ug via ORAL
  Filled 2016-04-04 (×5): qty 1

## 2016-04-04 MED ORDER — SODIUM CHLORIDE 0.9% FLUSH
10.0000 mL | INTRAVENOUS | Status: DC | PRN
  Administered 2016-04-06: 10 mL
  Filled 2016-04-04: qty 40

## 2016-04-04 MED ORDER — ZOLPIDEM TARTRATE 5 MG PO TABS
5.0000 mg | ORAL_TABLET | Freq: Every evening | ORAL | Status: DC | PRN
Start: 2016-04-04 — End: 2016-04-09

## 2016-04-04 MED ORDER — GLUCERNA SHAKE PO LIQD
237.0000 mL | Freq: Three times a day (TID) | ORAL | Status: DC
  Administered 2016-04-04 – 2016-04-09 (×14): 237 mL via ORAL

## 2016-04-04 NOTE — Progress Notes (Signed)
Speech Language Pathology Treatment: Dysphagia  Patient Details Name: Kendra Osborne MRN: [id number] DOB: 07/12/1946 Today's Date: 04/04/2016 Time: 0950-1010 SLP Time Calculation (min) (ACUTE ONLY): 20 min  Assessment / Plan / Recommendation Clinical Impression  Pt seen to reinforce swallowing precautions following MBS yesterday. The pt was observed with nectar thick liquids, not wearing PMSV, reinforced the importance of this with staff. Pt did have some coughing if she took overlarge sips. With visual cues she return demosntrated small sips with adequate success over 4 oz of intake. Continue current diet. Will return to reinforce precautions with son. Pt iwll need f/u SLP, home health if she goes home.    HPI HPI: 70 y.o. woman who recently moved from [name] (no known PMHx) who presented to ED with respiratory distress and lower extremity edema. Pt was intubated 3/17 and failed to wean, therefore trach placed. CXR 3/31 stable bibasilar atelectasis.       SLP Plan  Continue with current plan of care     Recommendations  Diet recommendations: Dysphagia 3 (mechanical soft);Nectar-thick liquid Liquids provided via: Cup Medication Administration: Via alternative means Supervision: Intermittent supervision to cue for compensatory strategies Compensations: Slow rate;Small sips/bites Postural Changes and/or Swallow Maneuvers: Seated upright 90 degrees      Patient may use [name] Speech Valve: During all waking hours (remove during sleep);During PO intake/meals;During all therapies with supervision      Plan: Continue with current plan of care     GO               [facility] Health InstituteBonnie Rickayla Wieland, MA CCC-SLP [phone]  Claudine MoutonDeBlois, Farrah Skoda Caroline 04/04/2016, 11:28 AM

## 2016-04-04 NOTE — Progress Notes (Signed)
Physical Therapy Treatment Patient Details Name: Kendra Osborne MRN: [id number] DOB: 09/13/1946 Today's Date: 04/04/2016    History of Present Illness pt presents with Large thyroid mass/goiter and Respiratory Difficulties.  pt has underwent Total Thyroidectomy with goiter removal and has remained intubated with one failed extubation on 3/24.  pt [name] and only known medical hx is HTN and Goiter.      PT Comments    Patient inconsistently followed instruction despite multimodal cues and interpreter. Worked on stand pivot with max A +2 required. Continue to progress as tolerated.   Follow Up Recommendations  CIR     Equipment Recommendations   (TBA--likely wheelchair)    Recommendations for Other Services       Precautions / Restrictions Precautions Precautions: Fall Precaution Comments:  (trach peg) Restrictions Weight Bearing Restrictions: No    Mobility  Bed Mobility Overal bed mobility: Needs Assistance Bed Mobility: Sit to Supine       Sit to supine: Mod assist;+2 for physical assistance;+2 for safety/equipment   General bed mobility comments: pt following multimodal cues with inconsistently; assist to bring bilat LE into bed and scoot hip into bed with use of bed pad  Transfers Overall transfer level: Needs assistance Equipment used: Rolling walker (2 wheeled) Transfers: Stand Pivot Transfers   Stand pivot transfers: Max assist;+2 physical assistance       General transfer comment: multimodal cues and use of interpreter but pt not following commands with tendency to pull on RW instead of pushing from armrests; max cues for hand placement, technique, posture, and safe use of DME; pt with flexed trunk throughout   Ambulation/Gait                 Stairs            Wheelchair Mobility    Modified Rankin (Stroke Patients Only)       Balance     Sitting balance-Leahy Scale: Fair Sitting balance - Comments: sat  EOB ~10 minutes; pt using PMV and able to cough secretions into her mouth and suction herself   Standing balance support: Bilateral upper extremity supported Standing balance-Leahy Scale: Zero                      Cognition Arousal/Alertness: Awake/alert Behavior During Therapy: WFL for tasks assessed/performed Overall Cognitive Status: Difficult to assess                      Exercises      General Comments General comments (skin integrity, edema, etc.): pt had BM upon standing and nursing staff in room end of session to perform pericare      Pertinent Vitals/Pain Pain Assessment: Faces Faces Pain Scale: Hurts even more Pain Location: abdomen Pain Descriptors / Indicators: Sore;Aching Pain Intervention(s): Limited activity within patient's tolerance;Monitored during session;Patient requesting pain meds-RN notified;Repositioned    Home Living                      Prior Function            PT Goals (current goals can now be found in the care plan section) Acute Rehab PT Goals Patient Stated Goal: none stated Time For Goal Achievement: 04/11/16 Progress towards PT goals: Progressing toward goals    Frequency  Min 3X/week    PT Plan Current plan remains appropriate    Co-evaluation  End of Session Equipment Utilized During Treatment: Oxygen;Gait belt Activity Tolerance: Patient tolerated treatment well Patient left: with call bell/phone within reach;in bed;with nursing/sitter in room     Time: 1400-1430 PT Time Calculation (min) (ACUTE ONLY): 30 min  Charges:  $Therapeutic Activity: 23-37 mins                    G Codes:      Derek Mound, PTA Pager: [phone]   04/04/2016, 2:43 PM

## 2016-04-04 NOTE — Progress Notes (Signed)
PULMONARY / CRITICAL CARE MEDICINE    Name: Kendra Osborne MRN: [id number] DOB: [birth year]    ADMISSION DATE:  02/24/2016  PRIMARY SERVICE: PCCM  CHIEF COMPLAINT:  Dyspnea  BRIEF PATIENT DESCRIPTION: 70 y/o woman recently moved from [place] who presented 3/17 with respiratory distress.& known goiter that has been present for years, as well as possibly hypertension. It is not clear if the goiter was ever worked up or if she ever was on regular therapy for her hypertension. Her son (who speaks [place]) report that she been having a cough for a few days as well as some fevers and chills. Had aspiration event after trach change and was placed back on cuffed trach with vent,  out to SDU.    SUBJECTIVE:   On 28% ATC, tolerating PMV -phonating well with  #6 XLT uncuffed trach.   Out of bed to chair and able to suction herself   VITAL SIGNS: Temp:  [97.9 F (36.6 C)-98.8 F (37.1 C)] 98.3 F (36.8 C) (04/26 0428) Pulse Rate:  [61-69] 61 (04/26 0428) Resp:  [16-19] 16 (04/26 0428) BP: (146-159)/(58-65) 159/61 mmHg (04/26 0428) SpO2:  [90 %-97 %] 96 % (04/26 0428) FiO2 (%):  [28 %] 28 % (04/26 0355)   HEMODYNAMICS:     VENTILATOR SETTINGS: Vent Mode:  [-]  FiO2 (%):  [28 %] 28 %  INTAKE / OUTPUT: Intake/Output      04/25 0701 - 04/26 0700 04/26 0701 - 04/27 0700   P.O. 60 360   I.V. (mL/kg) 208 (2.2)    Other  240   NG/GT 200    Total Intake(mL/kg) 468 (5) 600 (6.4)   Urine (mL/kg/hr) 1050 (0.5)    Stool 1 (0)    Total Output 1051     Net -583 +600         PHYSICAL EXAMINATION: General:  Obese female in NAD on ATC Integument:  Warm & dry. No rash on exposed skin. HEENT:  #6 XLT uncuffed trach in place, poor dentition.  Cardiovascular:  s1s2 RRR Pulmonary:  Non-labored, lungs bilaterally coarse, scattered rhonchi Abdomen: Soft, Normal bowel sounds. Nondistended. PEG. Some facial grimace with abd palpation Neurological: Spontaneously moving all 4 extremities. Eyes open.  Appears grossly nonfocal. Follows simple gestures ie. take a big breath with prompting.  Some broken english  LABS:  CBC  Recent Labs Lab 03/29/16 0343 04/01/16 0552  WBC 13.6* 12.9*  HGB 9.2* 9.7*  HCT 30.7* 32.8*  PLT 290 335   BMET  Recent Labs Lab 03/29/16 0343 04/01/16 0552 04/02/16 0417  NA 135 135 139  K 3.5 3.3* 3.7  CL 86* 87* 92*  CO2 38* 36* 37*  BUN 29* 41* 40*  CREATININE 0.75 0.87 0.74  GLUCOSE 138* 154* 114*   Electrolytes  Recent Labs Lab 03/29/16 0343 04/01/16 0552 04/02/16 0417  CALCIUM 8.3* 8.5* 8.6*     Imaging Dg Swallowing Func-speech Pathology  04/03/2016  Objective Swallowing Evaluation: Type of Study: MBS-Modified Barium Swallow Study Patient Details Name: Kendra Osborne MRN: [id number] Date of Birth: December 07, 1946 Today's Date: 04/03/2016 Time: SLP Start Time (ACUTE ONLY): 1120-SLP Stop Time (ACUTE ONLY): 1154 SLP Time Calculation (min) (ACUTE ONLY): 34 min Past Medical History: Past Medical History Diagnosis Date . Hypertension  . Chronic diastolic CHF (congestive heart failure) [facility])  Past Surgical History: Past Surgical History Procedure Laterality Date . Thyroidectomy N/A 02/28/2016   Procedure:  TOTAL THYROIDECTOMY WITH NIMS MONITOR;  Surgeon: Melvenia Beam, MD;  Location: [facility] OR;  Service: ENT;  Laterality: N/A; . Tracheostomy tube placement N/A 03/09/2016   Procedure: TRACHEOSTOMY;  Surgeon: Melvenia Beam, MD;  Location: [facility] OR;  Service: ENT;  Laterality: N/A; HPI: 70 y.o. woman who recently moved from [place] (no known PMHx) who presented to ED with respiratory distress and lower extremity edema. Pt was intubated 3/17 and failed to wean, therefore trach placed. CXR 3/31 stable bibasilar atelectasis.  Subjective: pt alert, with PMSV in place Assessment / Plan / Recommendation CHL IP CLINICAL IMPRESSIONS 04/03/2016 Therapy Diagnosis Mild pharyngeal phase dysphagia;Moderate pharyngeal phase dysphagia Clinical Impression Pt demonstrates significant  improvement in pharyngeal function. She is wearing PMSV during assessment and tolerating well, orally expectorating secretions as needed. Oropharyngeal strength is WNL. Primary deficit is that with larger boluses, nectar or thin, pt cannot achieve complete airway closure before/during the swallow leading to trace silent penetration events. A chin tuck was not effective. Aspiration may have occurred but due to pts shoulder in the image it is not clear. There was also mild backflow and laryngopharyngeal reflux with liquids given after a pill was administered. Overall pt is much improved, though she is reommended to consume nectar thick liquids and dys 3 solids with precautions of small sips and slow well paced intake. No straws, must wear PMSV. Discussed with pts son who was present. SLP Kendra f/u for tolerance.  Impact on safety and function Mild aspiration risk;Moderate aspiration risk   CHL IP TREATMENT RECOMMENDATION 04/03/2016 Treatment Recommendations Therapy as outlined in treatment plan below   Prognosis 04/03/2016 Prognosis for Safe Diet Advancement Good Barriers to Reach Goals -- Barriers/Prognosis Comment -- CHL IP DIET RECOMMENDATION 04/03/2016 SLP Diet Recommendations Dysphagia 3 (Mech soft) solids;Nectar thick liquid Liquid Administration via Cup;No straw Medication Administration Via alternative means Compensations Slow rate;Small sips/bites Postural Changes Seated upright at 90 degrees   CHL IP OTHER RECOMMENDATIONS 04/03/2016 Recommended Consults -- Oral Care Recommendations Oral care BID Other Recommendations --   CHL IP FOLLOW UP RECOMMENDATIONS 04/03/2016 Follow up Recommendations 24 hour supervision/assistance   CHL IP FREQUENCY AND DURATION 04/03/2016 Speech Therapy Frequency (ACUTE ONLY) min 2x/week Treatment Duration 2 weeks      CHL IP ORAL PHASE 04/03/2016 Oral Phase WFL Oral - Pudding Teaspoon -- Oral - Pudding Cup -- Oral - Honey Teaspoon -- Oral - Honey Cup -- Oral - Nectar Teaspoon -- Oral -  Nectar Cup -- Oral - Nectar Straw -- Oral - Thin Teaspoon -- Oral - Thin Cup -- Oral - Thin Straw -- Oral - Puree -- Oral - Mech Soft -- Oral - Regular -- Oral - Multi-Consistency -- Oral - Pill -- Oral Phase - Comment --  CHL IP PHARYNGEAL PHASE 04/03/2016 Pharyngeal Phase Impaired Pharyngeal- Pudding Teaspoon -- Pharyngeal -- Pharyngeal- Pudding Cup -- Pharyngeal -- Pharyngeal- Honey Teaspoon -- Pharyngeal -- Pharyngeal- Honey Cup -- Pharyngeal -- Pharyngeal- Nectar Teaspoon WFL Pharyngeal Material does not enter airway Pharyngeal- Nectar Cup Penetration/Apiration after swallow Pharyngeal Material enters airway, passes BELOW cords then ejected out Pharyngeal- Nectar Straw Penetration/Aspiration during swallow;Reduced airway/laryngeal closure Pharyngeal Material enters airway, remains ABOVE vocal cords and not ejected out Pharyngeal- Thin Teaspoon -- Pharyngeal -- Pharyngeal- Thin Cup Delayed swallow initiation-pyriform sinuses;Penetration/Aspiration during swallow;Penetration/Aspiration before swallow;Reduced airway/laryngeal closure;Compensatory strategies attempted (with notebox) Pharyngeal Material enters airway, passes BELOW cords without attempt by patient to eject out (silent aspiration) Pharyngeal- Thin Straw Delayed swallow initiation-pyriform sinuses;Penetration/Aspiration during swallow;Penetration/Aspiration before swallow;Reduced airway/laryngeal closure;Compensatory strategies attempted (with notebox) Pharyngeal Material enters airway, passes BELOW cords without attempt by  patient to eject out (silent aspiration) Pharyngeal- Puree WFL Pharyngeal Material does not enter airway Pharyngeal- Mechanical Soft -- Pharyngeal -- Pharyngeal- Regular WFL Pharyngeal -- Pharyngeal- Multi-consistency -- Pharyngeal -- Pharyngeal- Pill WFL Pharyngeal -- Pharyngeal Comment --  CHL IP CERVICAL ESOPHAGEAL PHASE 03/27/2016 Cervical Esophageal Phase (No Data) Pudding Teaspoon -- Pudding Cup -- Honey Teaspoon -- Honey Cup --  Nectar Teaspoon -- Nectar Cup -- Nectar Straw -- Thin Teaspoon -- Thin Cup -- Thin Straw -- Puree -- Mechanical Soft -- Regular -- Multi-consistency -- Pill -- Cervical Esophageal Comment -- No flowsheet data found. Harlon DittyBonnie DeBlois, MA CCC-SLP [phone] Claudine MoutonDeBlois, Bonnie Caroline 04/03/2016, 1:53 PM                 SIGNIFICANT EVENTS: 3/17 - Admit 3/21 - Thyroidectomy 3/24 - Extubation & reintubation within 2 hours due to hypoxia & secretions 4/06 -  24 hr trach collar 4/07- Trach change by ENT to 6 uncuffed proximal XLT 4/08 - Transferred back to ICU for resp distress/asp pna. Trach changed to #6 cuffed XLT at bedside.  4/12 - Trach collar 4/17 - Changed trach to 6 cuffless 4/24 - Tolerating 28% ATC  STUDIES:  CT-A of Chest >> mild mediastinal enlargement CT of the Neck with contrast >> multinodular goiter, meningioma Port CXR 3/22 >>   ETT & CVL in good position. Blunting bilateral costophrenic angles suggestive of effusions. Bilateral hilar fullness & interstitial prominence with low lung volumes.  Port CXR 3/25 >> Silhouetting of the left hemidiaphragm. Platelike atelectasis right lower lung. Endotracheal tube in good position. Ort CXR 4/8 >> incr bibasilar atelectasis 4/12 pCXR >> persistent elevation of right hemidiaphragm, bibasilar atelectasis vs infltrate  LINES / TUBES: OETT 7.5 3/17 - 3/24; 7.0 3/24>>>3/31 Trach (ENT) 3/31 >> L Subclavian CVL 3/21 >> L Port 3/21>>> OGT 3/21 - 3/24; 3/24 >> Foley 3/18 >> L Radial Art Line 3/21 - 3/23 R & L Neck Drains 3/21 - ???  MICROBIOLOGY: Tracheal Asp Ctx 3/2 >>> ng Blood Ctx x2 3/18 >>  Negative  MRSA PCR 3/18 >>  Negative  Trache asp 4/8 >> candida   ANTIBIOTICS: Unasyn 3/24 >> 4/18 Ancef 3/21 - 3/24 Clindamycin 3/21 (only 1 dose) Azithromycin 3/18 - 3/19 Rocephin 3/18 - 3/20  ASSESSMENT / PLAN:   Acute on Chronic Hypercarbic Respiratory Failure Acute Hypoxic Respiratory Failure - Aspiration event with concern for  aspiration PNA Extrinsic Airway Compression - Secondary to goiter. S/P total thyroidectomy 3/21. Elevated Right Hemidiaphragm  Dyspahgia  Tracheostomy Status - #6 cuffless trach since 4/17  Plan: RN/RT to teach family re: trach care. Patient Kendra not be able to be placed in a facility.   - only option for downsize is 4 cuffless shiley (no XLT present)  -PMV valve MBS 4/25 >> dys 3 diet ATC 28%, wean to room air for sats > 90%  Push pulmonary hygiene - mobilize, deep breathing etc  PT consult   Cyril Mourningakesh Amariss Detamore MD. FCCP. [facility] Pager [phone] If no response call [phone]   04/04/2016

## 2016-04-04 NOTE — Progress Notes (Signed)
Calorie Count Note  48 hour calorie count ordered.  Diet: Dysphagia 3, nectar thick liquids Supplements: none  Pt sitting in recliner at time of visit; on trach collar.   Pt underwent MBSS on 04/03/16; pt advanced to dysphagia 3 diet with nectar thick liquids related to mild to moderate dysphagia.   Spoke with pt's son at bedside. He expresses that he is encouraged that pt is now eating. He reports that pt is tolerating foods and liquids well, but not eating much solid foods. Noted multiple cups of thickened liquids on pt tray table (coffee, water, and juice). Pt sipping juice without difficulty at time of visit.   Calorie count ordered and envelope and tray tickets on door. Observed pt's breakfast tray; noted pt consumed 50% of pancake, 50% of scrambled eggs, 100% of juice, and 50% of coffee, and 50% of grits.   Case discussed with RN. She confirms that pt is tolerating diet textures well and is consuming more liquids than solid foods at this time. She confirms that pt is not longer receiving TF; RD to d/c TF orders.   Breakfast: 490 kclals, 15 grams protein Lunch: n/a Dinner: n/a Supplements: n/a  Estimated nutritional needs:  Kclas: 1500-1600  Protein: 100-115 grams Fluid: > 1.5 L  Nutrition Dx: Inadequate oral intake related to dysphagia as evidenced by PO <25%; Ongoing  Goal: Pt will meet >90% of estimated nutritional needs; progressing  Intervention:   -Glucerna Shake po TID, each supplement provides 220 kcal and 10 grams of protein -RD will follow-up on 04/05/16 for calorie count results  Kendra Osborne, RD, LDN, CDE Pager: [phone] After hours Pager: [phone]

## 2016-04-04 NOTE — Progress Notes (Signed)
Progress Note    Kendra Osborne  ZOX:[id number] DOB: 07/09/1946  DOA: 02/24/2016 PCP: No PCP Per Patient   Outpatient Specialists:   None.   Brief Narrative:   Kendra Osborne is an 70 y.o. female with a PMH of goiter and hypertension who [name] presented to the hospital on 02/24/16 with respiratory distress in the setting of a few day history of cough, fever and chills. She was intubated on admission. 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 She was extubated and reintubated within 2 hours on 03/02/16 secondary to hypoxia and increased secretions. She underwent placement of a tracheostomy tube 03/09/16 and remained on the ventilator until 03/15/16 when she was weaned to a 24-hour trach collar. Her tracheostomy was changed by ENT 03/16/16. She developed worsening respiratory distress and aspiration pneumonia on 03/17/16 and required ongoing ventilator support. Transition to a trach collar on 03/21/16.  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 Sedating medications have been minimized. She completed a course of antibiotics on 03/02/16. She continues on a trach collar and pulmonology continues to follow. On 28% ATC, tolerating PMV with SLP.  Aspiration event  Has been tx for aspiration pneumonitis - SLP following dysphagia -  Recommended dysphagia 3 diet.   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  Altered mental status Back to baseline. B12 + folate normal - RPR and HIV negative - ammonia not signif elevated - ?ICU psychosis / sundowning - seroquel low dose QHS only for now. Much improved today.    HTN  BP  reasonably well controlled.  Chronic grade 1 diastolic CHF Continue to monitor fluid volume status. Continue Lasix 40 mg twice a day.  Acute Renal Failure (NO CKD) Resolved.  Dyspahgia PEG tube placed 03/15/16. Has been managed with tube feeding via PEG. Speech therapy to reevaluate.  Postoperative Hypothyroidism S/P Total Thyroidectomy - previously normal TSH. Continue Synthroid. Recheck TSH in 6-8 weeks as adjustment of dose likely to be required .  Obesity - Body mass index is 42.02 kg/(m^2).      Family Communication/Anticipated D/C date and plan/Code Status   DVT prophylaxis: Heparin ordered. Code Status: Full Code.  Family Communication: No family currently at the bedside. Disposition Plan: ? SNF versus home with family in a few days.   Medical Consultants:    ENT  Pulmonology  Interventional Radiology   Procedures:   02/26/16 TTE EF 60%-65% - grade 1 diastolic dysfunction 02/28/16 Thyroidectomy 3/24 17 - Extubation & reintubation within 2 hours due to hypoxia & secretions 03/15/16 24 hr trach collar 03/15/16 IR GASTROSTOMY TUBE  03/16/16 tracheostomy tube change - old 6 cuffed proximal XLT shiley was removed with the old silk sutures, and a new 6 cuffless proximal XLT shiley was placed  03/17/16 transferred back to icu for resp distress/asp pna. Trach changed to #6 cuffed XLT at bedside. 03/21/16 trach collar 03/26/16 #6 cuffed XLT change to #6 cuffless XLT  Anti-Infectives:    Unasyn 3/24 > 4/3;4/8 > 4/18  Ancef 3/21 > 3/24  Clindamycin 3/21   Azithromycin 3/18 > 3/19  Rocephin 3/18 > 3/20  Subjective:    Kendra Osborne denies any new complaints.   Objective:    Filed Vitals:   04/04/16  1610 04/04/16 0428 04/04/16 1445 04/04/16 1713  BP:  159/61 158/128 139/61  Pulse: 63 61 73 72  Temp:  98.3 F (36.8 C) 98.6 F (37 C)   TempSrc:  Oral    Resp: Height:      Weight:      SpO2: 90% 96% 99%     Intake/Output Summary (Last 24  hours) at 04/04/16 1808 Last data filed at 04/04/16 1300  Gross per 24 hour  Intake   1168 ml  Output    450 ml  Net    718 ml   Filed Weights   04/01/16 0401 04/02/16 0320 04/03/16 0500  Weight: 96.9 kg (213 lb 10 oz) 97.6 kg (215 lb 2.7 oz) 93.8 kg (206 lb 12.7 oz)    Exam: General exam: Appears calm and comfortable.  Respiratory system: Occasional rhonchi. Respiratory effort normal. Trach in place with trach collar on. Cardiovascular system: S1 & S2 heard, RRR. No JVD, murmurs, rubs, gallops or clicks. No pedal edema. Gastrointestinal system: Abdomen is nondistended, soft and nontender. No organomegaly or masses felt. Normal bowel sounds heard. Central nervous system: Alert and oriented. No focal neurological deficits. Extremities: Symmetric 4 x 5 power. No clubbing, edema, or cyanosis. Skin: No rashes, lesions or ulcers Psychiatry: Judgement and insight appear normal. Mood & affect appropriate.   Data Reviewed:   I have personally reviewed following labs and imaging studies:  Labs: Basic Metabolic Panel:  Recent Labs Lab 03/29/16 0343 04/01/16 0552 04/02/16 0417  NA 135 135 139  K 3.5 3.3* 3.7  CL 86* 87* 92*  CO2 38* 36* 37*  GLUCOSE 138* 154* 114*  BUN 29* 41* 40*  CREATININE 0.75 0.87 0.74  CALCIUM 8.3* 8.5* 8.6*   GFR Estimated Creatinine Clearance: 68.9 mL/min (by C-G formula based on Cr of 0.74). Liver Function Tests:  Recent Labs Lab 03/29/16 0343 04/01/16 0552  AST 30 33  ALT 22 22  ALKPHOS 60 65  BILITOT 0.5 0.5  PROT 6.9 7.3  ALBUMIN 2.0* 2.1*    Recent Labs Lab 03/29/16 0345  AMMONIA 41*   CBC:  Recent Labs Lab 03/29/16 0343 04/01/16 0552  WBC 13.6* 12.9*  HGB 9.2* 9.7*  HCT 30.7* 32.8*  MCV 86.7 87.5  PLT 290 335   CBG:  Recent Labs Lab 04/04/16 0037 04/04/16 0424 04/04/16 0811 04/04/16 1141 04/04/16 1622  GLUCAP 106* 117* 119* 153* 118*   Urine analysis:    Component Value Date/Time   COLORURINE YELLOW  02/25/2016 0124   APPEARANCEUR CLEAR 02/25/2016 0124   LABSPEC 1.046* 02/25/2016 0124   PHURINE 5.5 02/25/2016 0124   GLUCOSEU NEGATIVE 02/25/2016 0124   HGBUR NEGATIVE 02/25/2016 0124   BILIRUBINUR NEGATIVE 02/25/2016 0124   KETONESUR NEGATIVE 02/25/2016 0124   PROTEINUR 100* 02/25/2016 0124   NITRITE NEGATIVE 02/25/2016 0124   LEUKOCYTESUR NEGATIVE 02/25/2016 0124   Microbiology No results found for this or any previous visit (from the past 240 hour(s)).  Radiology: Dg Swallowing Func-speech Pathology  04/03/2016  Objective Swallowing Evaluation: Type of Study: MBS-Modified Barium Swallow Study Patient Details Name: Kendra Osborne MRN: [id number] Date of Birth: 01/29/1946 Today's Date: 04/03/2016 Time: SLP Start Time (ACUTE ONLY): 1120-SLP Stop Time (ACUTE ONLY): 1154 SLP Time Calculation (min) (ACUTE ONLY): 34 min Past Medical History: Past Medical History Diagnosis Date . Hypertension  . Chronic diastolic CHF (congestive heart failure) [facility])  Past Surgical History: Past Surgical History Procedure Laterality Date . Thyroidectomy N/A 02/28/2016  Procedure:  TOTAL THYROIDECTOMY WITH NIMS MONITOR;  Surgeon: Melvenia Beam, MD;  Location: [facility] OR;  Service: ENT;  Laterality: N/A; . Tracheostomy tube placement N/A 03/09/2016   Procedure: TRACHEOSTOMY;  Surgeon: Melvenia Beam, MD;  Location: [facility] OR;  Service: ENT;  Laterality: N/A; HPI: 70 y.o. woman who recently moved from [place] (no known PMHx) who presented to ED with respiratory distress and lower extremity edema. Pt was intubated 3/17 and failed to wean, therefore trach placed. CXR 3/31 stable bibasilar atelectasis.  Subjective: pt alert, with PMSV in place Assessment / Plan / Recommendation CHL IP CLINICAL IMPRESSIONS 04/03/2016 Therapy Diagnosis Mild pharyngeal phase dysphagia;Moderate pharyngeal phase dysphagia Clinical Impression Pt demonstrates significant improvement in pharyngeal function. She is wearing PMSV during assessment and tolerating well,  orally expectorating secretions as needed. Oropharyngeal strength is WNL. Primary deficit is that with larger boluses, nectar or thin, pt cannot achieve complete airway closure before/during the swallow leading to trace silent penetration events. A chin tuck was not effective. Aspiration may have occurred but due to pts shoulder in the image it is not clear. There was also mild backflow and laryngopharyngeal reflux with liquids given after a pill was administered. Overall pt is much improved, though she is reommended to consume nectar thick liquids and dys 3 solids with precautions of small sips and slow well paced intake. No straws, must wear PMSV. Discussed with pts son who was present. SLP will f/u for tolerance.  Impact on safety and function Mild aspiration risk;Moderate aspiration risk   CHL IP TREATMENT RECOMMENDATION 04/03/2016 Treatment Recommendations Therapy as outlined in treatment plan below   Prognosis 04/03/2016 Prognosis for Safe Diet Advancement Good Barriers to Reach Goals -- Barriers/Prognosis Comment -- CHL IP DIET RECOMMENDATION 04/03/2016 SLP Diet Recommendations Dysphagia 3 (Mech soft) solids;Nectar thick liquid Liquid Administration via Cup;No straw Medication Administration Via alternative means Compensations Slow rate;Small sips/bites Postural Changes Seated upright at 90 degrees   CHL IP OTHER RECOMMENDATIONS 04/03/2016 Recommended Consults -- Oral Care Recommendations Oral care BID Other Recommendations --   CHL IP FOLLOW UP RECOMMENDATIONS 04/03/2016 Follow up Recommendations 24 hour supervision/assistance   CHL IP FREQUENCY AND DURATION 04/03/2016 Speech Therapy Frequency (ACUTE ONLY) min 2x/week Treatment Duration 2 weeks      CHL IP ORAL PHASE 04/03/2016 Oral Phase WFL Oral - Pudding Teaspoon -- Oral - Pudding Cup -- Oral - Honey Teaspoon -- Oral - Honey Cup -- Oral - Nectar Teaspoon -- Oral - Nectar Cup -- Oral - Nectar Straw -- Oral - Thin Teaspoon -- Oral - Thin Cup -- Oral - Thin Straw  -- Oral - Puree -- Oral - Mech Soft -- Oral - Regular -- Oral - Multi-Consistency -- Oral - Pill -- Oral Phase - Comment --  CHL IP PHARYNGEAL PHASE 04/03/2016 Pharyngeal Phase Impaired Pharyngeal- Pudding Teaspoon -- Pharyngeal -- Pharyngeal- Pudding Cup -- Pharyngeal -- Pharyngeal- Honey Teaspoon -- Pharyngeal -- Pharyngeal- Honey Cup -- Pharyngeal -- Pharyngeal- Nectar Teaspoon WFL Pharyngeal Material does not enter airway Pharyngeal- Nectar Cup Penetration/Apiration after swallow Pharyngeal Material enters airway, passes BELOW cords then ejected out Pharyngeal- Nectar Straw Penetration/Aspiration during swallow;Reduced airway/laryngeal closure Pharyngeal Material enters airway, remains ABOVE vocal cords and not ejected out Pharyngeal- Thin Teaspoon -- Pharyngeal -- Pharyngeal- Thin Cup Delayed swallow initiation-pyriform sinuses;Penetration/Aspiration during swallow;Penetration/Aspiration before swallow;Reduced airway/laryngeal closure;Compensatory strategies attempted (with notebox) Pharyngeal Material enters airway, passes BELOW cords without attempt by patient to eject out (silent aspiration) Pharyngeal- Thin Straw Delayed swallow initiation-pyriform sinuses;Penetration/Aspiration during swallow;Penetration/Aspiration before  swallow;Reduced airway/laryngeal closure;Compensatory strategies attempted (with notebox) Pharyngeal Material enters airway, passes BELOW cords without attempt by patient to eject out (silent aspiration) Pharyngeal- Puree WFL Pharyngeal Material does not enter airway Pharyngeal- Mechanical Soft -- Pharyngeal -- Pharyngeal- Regular WFL Pharyngeal -- Pharyngeal- Multi-consistency -- Pharyngeal -- Pharyngeal- Pill WFL Pharyngeal -- Pharyngeal Comment --  CHL IP CERVICAL ESOPHAGEAL PHASE 03/27/2016 Cervical Esophageal Phase (No Data) Pudding Teaspoon -- Pudding Cup -- Honey Teaspoon -- Honey Cup -- Nectar Teaspoon -- Nectar Cup -- Nectar Straw -- Thin Teaspoon -- Thin Cup -- Thin Straw -- Puree  -- Mechanical Soft -- Regular -- Multi-consistency -- Pill -- Cervical Esophageal Comment -- No flowsheet data found. Harlon Ditty, [place] CCC-SLP [phone] Claudine Mouton 04/03/2016, 1:53 PM               Medications:   . bacitracin   Topical TID  . chlorhexidine  15 mL Mouth Rinse BID  . docusate  100 mg Oral BID  . feeding supplement (GLUCERNA SHAKE)  237 mL Oral TID BM  . furosemide  40 mg Per Tube BID  . heparin subcutaneous  5,000 Units Subcutaneous 3 times per day  . [START ON 04/05/2016] levothyroxine  100 mcg Oral QAC breakfast  . pantoprazole sodium  40 mg Per Tube Daily  . polyethylene glycol  17 g Per Tube Daily  . simethicone  40 mg Per Tube QID   Continuous Infusions: . sodium chloride 10 mL/hr at 04/02/16 2158    Time spent: 25 minutes.  The patient is medically complex with multiple co-morbidities and is at high risk for clinical deterioration and requires high complexity decision making.    LOS: 39 days   Kendra Osborne  [facility] Pager [phone] *Please refer to [email or web address], password [email or web address] to get updated schedule on who will round on this patient, as hospitalists switch teams weekly. If 7PM-7AM, please contact night-coverage at [email or web address], password [email or web address] for any overnight needs.  04/04/2016, 6:08 PM

## 2016-04-05 ENCOUNTER — Inpatient Hospital Stay (HOSPITAL_COMMUNITY): Payer: Medicaid Other

## 2016-04-05 LAB — BASIC METABOLIC PANEL
ANION GAP: 9 (ref 5–15)
BUN: 32 mg/dL — ABNORMAL HIGH (ref 6–20)
CALCIUM: 8.5 mg/dL — AB (ref 8.9–10.3)
CO2: 36 mmol/L — ABNORMAL HIGH (ref 22–32)
Chloride: 91 mmol/L — ABNORMAL LOW (ref 101–111)
Creatinine, Ser: 0.8 mg/dL (ref 0.44–1.00)
GLUCOSE: 110 mg/dL — AB (ref 65–99)
POTASSIUM: 3.6 mmol/L (ref 3.5–5.1)
Sodium: 136 mmol/L (ref 135–145)

## 2016-04-05 LAB — CBC
HCT: 31.3 % — ABNORMAL LOW (ref 36.0–46.0)
HEMOGLOBIN: 9.3 g/dL — AB (ref 12.0–15.0)
MCH: 26 pg (ref 26.0–34.0)
MCHC: 29.7 g/dL — ABNORMAL LOW (ref 30.0–36.0)
MCV: 87.4 fL (ref 78.0–100.0)
Platelets: 292 10*3/uL (ref 150–400)
RBC: 3.58 MIL/uL — AB (ref 3.87–5.11)
RDW: 19.8 % — ABNORMAL HIGH (ref 11.5–15.5)
WBC: 10 10*3/uL (ref 4.0–10.5)

## 2016-04-05 LAB — GLUCOSE, CAPILLARY
GLUCOSE-CAPILLARY: 135 mg/dL — AB (ref 65–99)
Glucose-Capillary: 102 mg/dL — ABNORMAL HIGH (ref 65–99)
Glucose-Capillary: 112 mg/dL — ABNORMAL HIGH (ref 65–99)
Glucose-Capillary: 113 mg/dL — ABNORMAL HIGH (ref 65–99)
Glucose-Capillary: 126 mg/dL — ABNORMAL HIGH (ref 65–99)
Glucose-Capillary: 137 mg/dL — ABNORMAL HIGH (ref 65–99)

## 2016-04-05 MED ORDER — OXYMETAZOLINE HCL 0.05 % NA SOLN
2.0000 | Freq: Three times a day (TID) | NASAL | Status: AC
  Administered 2016-04-05 – 2016-04-08 (×8): 2 via NASAL
  Filled 2016-04-05 (×2): qty 15

## 2016-04-05 MED ORDER — SIMETHICONE 40 MG/0.6ML PO SUSP
160.0000 mg | Freq: Four times a day (QID) | ORAL | Status: DC | PRN
  Administered 2016-04-05 – 2016-04-06 (×3): 160 mg via ORAL
  Filled 2016-04-05 (×5): qty 2.4

## 2016-04-05 NOTE — Progress Notes (Signed)
PULMONARY / CRITICAL CARE MEDICINE    Name: Kendra Osborne MRN: [id number] DOB: [birth year]    ADMISSION DATE:  02/24/2016  PRIMARY SERVICE: PCCM  CHIEF COMPLAINT:  Dyspnea  BRIEF PATIENT DESCRIPTION: 70 y/o woman recently moved from [place] who presented 3/17 with respiratory distress.& known goiter that has been present for years, as well as possibly hypertension. It is not clear if the goiter was ever worked up or if she ever was on regular therapy for her hypertension. Her son (who speaks [place]) report that she been having a cough for a few days as well as some fevers and chills. Had aspiration event after trach change and was placed back on cuffed trach with vent,  out to SDU.    SUBJECTIVE:   On 28% ATC, tolerating PMV -phonating well with  #6 XLT uncuffed trach.   Out of bed to chair and able to suction herself Wants trach out   VITAL SIGNS: Temp:  [98.1 F (36.7 C)-98.6 F (37 C)] 98.1 F (36.7 C) (04/27 0552) Pulse Rate:  [63-73] 63 (04/27 0552) Resp:  [17-18] 18 (04/27 0552) BP: (139-160)/(58-128) 159/58 mmHg (04/27 0552) SpO2:  [98 %-100 %] 100 % (04/27 0552) FiO2 (%):  [28 %] 28 % (04/27 0414) Weight:  [214 lb 1.1 oz (97.1 kg)] 214 lb 1.1 oz (97.1 kg) (04/27 0552)   HEMODYNAMICS:     VENTILATOR SETTINGS: Vent Mode:  [-]  FiO2 (%):  [28 %] 28 %  INTAKE / OUTPUT: Intake/Output      04/26 0701 - 04/27 0700 04/27 0701 - 04/28 0700   P.O. 720 240   I.V. (mL/kg)     Other 240    NG/GT     Total Intake(mL/kg) 960 (9.9) 240 (2.5)   Urine (mL/kg/hr) 1150 (0.5)    Stool     Total Output 1150     Net -190 +240        Stool Occurrence  3 x    PHYSICAL EXAMINATION: General:  Obese female in NAD on ATC, eating with assistance Integument:  Warm & dry. No rash on exposed skin. HEENT:  #6 XLT uncuffed trach in place, poor dentition. Does not talk despite PMV Cardiovascular:  s1s2 RRR Pulmonary:  Non-labored, lungs bilaterally coarse, scattered rhonchi, decreased in  bases Abdomen: Soft, Normal bowel sounds. Nondistended. PEG.  Neurological: Spontaneously moving all 4 extremities. Eyes open. Appears grossly nonfocal. Follows simple gestures ie. take a big breath with prompting.    LABS:  CBC  Recent Labs Lab 04/01/16 0552 04/05/16 0450  WBC 12.9* 10.0  HGB 9.7* 9.3*  HCT 32.8* 31.3*  PLT 335 292   BMET  Recent Labs Lab 04/01/16 0552 04/02/16 0417 04/05/16 0450  NA 135 139 136  K 3.3* 3.7 3.6  CL 87* 92* 91*  CO2 36* 37* 36*  BUN 41* 40* 32*  CREATININE 0.87 0.74 0.80  GLUCOSE 154* 114* 110*   Electrolytes  Recent Labs Lab 04/01/16 0552 04/02/16 0417 04/05/16 0450  CALCIUM 8.5* 8.6* 8.5*     Imaging Dg Swallowing Func-speech Pathology  04/03/2016  Objective Swallowing Evaluation: Type of Study: MBS-Modified Barium Swallow Study Patient Details Name: Kendra Osborne MRN: [id number] Date of Birth: 1946/04/12 Today's Date: 04/03/2016 Time: SLP Start Time (ACUTE ONLY): 1120-SLP Stop Time (ACUTE ONLY): 1154 SLP Time Calculation (min) (ACUTE ONLY): 34 min Past Medical History: Past Medical History Diagnosis Date . Hypertension  . Chronic diastolic CHF (congestive heart failure) [facility])  Past Surgical History: Past Surgical  History Procedure Laterality Date . Thyroidectomy N/A 02/28/2016   Procedure:  TOTAL THYROIDECTOMY WITH NIMS MONITOR;  Surgeon: Melvenia BeamMitchell Gore, MD;  [facility];  Service: ENT;  Laterality: N/A; . Tracheostomy tube placement N/A 03/09/2016   Procedure: TRACHEOSTOMY;  Surgeon: Melvenia BeamMitchell Gore, MD;  [facility];  Service: ENT;  Laterality: N/A; HPI: 70 y.o. woman who recently moved from [name] (no known PMHx) who presented to ED with respiratory distress and lower extremity edema. Pt was intubated 3/17 and failed to wean, therefore trach placed. CXR 3/31 stable bibasilar atelectasis.  Subjective: pt alert, with PMSV in place Assessment / Plan / Recommendation CHL IP CLINICAL IMPRESSIONS 04/03/2016 Therapy Diagnosis Mild  pharyngeal phase dysphagia;Moderate pharyngeal phase dysphagia Clinical Impression Pt demonstrates significant improvement in pharyngeal function. She is wearing PMSV during assessment and tolerating well, orally expectorating secretions as needed. Oropharyngeal strength is WNL. Primary deficit is that with larger boluses, nectar or thin, pt cannot achieve complete airway closure before/during the swallow leading to trace silent penetration events. A chin tuck was not effective. Aspiration may have occurred but due to pts shoulder in the image it is not clear. There was also mild backflow and laryngopharyngeal reflux with liquids given after a pill was administered. Overall pt is much improved, though she is reommended to consume nectar thick liquids and dys 3 solids with precautions of small sips and slow well paced intake. No straws, must wear PMSV. Discussed with pts son who was present. SLP will f/u for tolerance.  Impact on safety and function Mild aspiration risk;Moderate aspiration risk   CHL IP TREATMENT RECOMMENDATION 04/03/2016 Treatment Recommendations Therapy as outlined in treatment plan below   Prognosis 04/03/2016 Prognosis for Safe Diet Advancement Good Barriers to Reach Goals -- Barriers/Prognosis Comment -- CHL IP DIET RECOMMENDATION 04/03/2016 SLP Diet Recommendations Dysphagia 3 (Mech soft) solids;Nectar thick liquid Liquid Administration via Cup;No straw Medication Administration Via alternative means Compensations Slow rate;Small sips/bites Postural Changes Seated upright at 90 degrees   CHL IP OTHER RECOMMENDATIONS 04/03/2016 Recommended Consults -- Oral Care Recommendations Oral care BID Other Recommendations --   CHL IP FOLLOW UP RECOMMENDATIONS 04/03/2016 Follow up Recommendations 24 hour supervision/assistance   CHL IP FREQUENCY AND DURATION 04/03/2016 Speech Therapy Frequency (ACUTE ONLY) min 2x/week Treatment Duration 2 weeks      CHL IP ORAL PHASE 04/03/2016 Oral Phase WFL Oral - Pudding  Teaspoon -- Oral - Pudding Cup -- Oral - Honey Teaspoon -- Oral - Honey Cup -- Oral - Nectar Teaspoon -- Oral - Nectar Cup -- Oral - Nectar Straw -- Oral - Thin Teaspoon -- Oral - Thin Cup -- Oral - Thin Straw -- Oral - Puree -- Oral - Mech Soft -- Oral - Regular -- Oral - Multi-Consistency -- Oral - Pill -- Oral Phase - Comment --  CHL IP PHARYNGEAL PHASE 04/03/2016 Pharyngeal Phase Impaired Pharyngeal- Pudding Teaspoon -- Pharyngeal -- Pharyngeal- Pudding Cup -- Pharyngeal -- Pharyngeal- Honey Teaspoon -- Pharyngeal -- Pharyngeal- Honey Cup -- Pharyngeal -- Pharyngeal- Nectar Teaspoon WFL Pharyngeal Material does not enter airway Pharyngeal- Nectar Cup Penetration/Apiration after swallow Pharyngeal Material enters airway, passes BELOW cords then ejected out Pharyngeal- Nectar Straw Penetration/Aspiration during swallow;Reduced airway/laryngeal closure Pharyngeal Material enters airway, remains ABOVE vocal cords and not ejected out Pharyngeal- Thin Teaspoon -- Pharyngeal -- Pharyngeal- Thin Cup Delayed swallow initiation-pyriform sinuses;Penetration/Aspiration during swallow;Penetration/Aspiration before swallow;Reduced airway/laryngeal closure;Compensatory strategies attempted (with notebox) Pharyngeal Material enters airway, passes BELOW cords without attempt by patient to eject out (silent aspiration)  Pharyngeal- Thin Straw Delayed swallow initiation-pyriform sinuses;Penetration/Aspiration during swallow;Penetration/Aspiration before swallow;Reduced airway/laryngeal closure;Compensatory strategies attempted (with notebox) Pharyngeal Material enters airway, passes BELOW cords without attempt by patient to eject out (silent aspiration) Pharyngeal- Puree WFL Pharyngeal Material does not enter airway Pharyngeal- Mechanical Soft -- Pharyngeal -- Pharyngeal- Regular WFL Pharyngeal -- Pharyngeal- Multi-consistency -- Pharyngeal -- Pharyngeal- Pill WFL Pharyngeal -- Pharyngeal Comment --  CHL IP CERVICAL ESOPHAGEAL PHASE  03/27/2016 Cervical Esophageal Phase (No Data) Pudding Teaspoon -- Pudding Cup -- Honey Teaspoon -- Honey Cup -- Nectar Teaspoon -- Nectar Cup -- Nectar Straw -- Thin Teaspoon -- Thin Cup -- Thin Straw -- Puree -- Mechanical Soft -- Regular -- Multi-consistency -- Pill -- Cervical Esophageal Comment -- No flowsheet data found. Harlon Ditty, MA CCC-SLP [phone] Claudine Mouton 04/03/2016, 1:53 PM                 SIGNIFICANT EVENTS: 3/17 - Admit 3/21 - Thyroidectomy 3/24 - Extubation & reintubation within 2 hours due to hypoxia & secretions 4/06 -  24 hr trach collar 4/07- Trach change by ENT to 6 uncuffed proximal XLT 4/08 - Transferred back to ICU for resp distress/asp pna. Trach changed to #6 cuffed XLT at bedside.  4/12 - Trach collar 4/17 - Changed trach to 6 cuffless 4/24 - Tolerating 28% ATC  STUDIES:  CT-A of Chest >> mild mediastinal enlargement CT of the Neck with contrast >> multinodular goiter, meningioma Port CXR 3/22 >>   ETT & CVL in good position. Blunting bilateral costophrenic angles suggestive of effusions. Bilateral hilar fullness & interstitial prominence with low lung volumes.  Port CXR 3/25 >> Silhouetting of the left hemidiaphragm. Platelike atelectasis right lower lung. Endotracheal tube in good position. Ort CXR 4/8 >> incr bibasilar atelectasis 4/12 pCXR >> persistent elevation of right hemidiaphragm, bibasilar atelectasis vs infltrate  LINES / TUBES: OETT 7.5 3/17 - 3/24; 7.0 3/24>>>3/31 Trach (ENT) 3/31 >> L Subclavian CVL 3/21 >> L Port 3/21>>> OGT 3/21 - 3/24; 3/24 >> Foley 3/18 >> L Radial Art Line 3/21 - 3/23 R & L Neck Drains 3/21 - ???  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 S/P total thyroidectomy 3/21. Elevated Right Hemidiaphragm  Dyspahgia  Tracheostomy Status - #6 cuffless trach since 4/17  Plan: RN/RT to teach family re: trach care. Patient will not be able to be placed in a facility.   - only option for downsize is 4 cuffless shiley (no XLT present)  -PMV valve MBS 4/25 >> dys 3 diet ATC 28%, wean to room air for sats > 90%  Push pulmonary hygiene - mobilize, deep breathing etc    Brett Canales Minor ACNP Ad[facility] Pollack PCCM Pager [phone] till 3 pm If no answer page [phone] 04/05/2016, 9:42 AM

## 2016-04-05 NOTE — Progress Notes (Signed)
Inpatient Rehabilitation  Received request from therapy to re-assess IP Rehab candidacy after original consult on 03/16/16 (see note for full details). Chart reviewed for updated therapy notes.  Patient continues to not be at a level appropriate for IP Rehab with inability to tolerate 3 hours of therapy a day.  Call me with questions.  Charlane FerrettiMelissa Keen Ewalt, M.A., CCC/SLP Admission Coordinator  [facility]  Cell [phone]

## 2016-04-05 NOTE — Trach Care Team (Signed)
Trach Care Progression Note   Patient Details Name: Juliane Pootaseem Shetley MRN: [id number] DOB: 04/18/1946 Today's Date: 04/05/2016   Tracheostomy Assessment    Tracheostomy Shiley 6 mm Proximal;Uncuffed (Active)  Status Secured;Passy [name] Speaking valve 04/05/2016 12:50 PM  Site Assessment Dry 04/05/2016 12:50 PM  Site Care Open to air 04/05/2016 12:00 AM  Inner Cannula Care Changed/new 04/04/2016  8:00 AM  Ties Assessment Clean;Dry;Secure 04/05/2016 12:50 PM  Cuff pressure (cm) 0 cm 04/05/2016 12:50 PM  Trach Changed Yes 03/26/2016 12:17 PM  Tracheostomy Equipment at bedside Yes and checklist posted at head of bed 04/05/2016 12:50 PM     Care Needs     Respiratory Therapy Tracheostomy: Chronic trach O2 Device: Tracheostomy Collar FiO2 (%): 28 % SpO2: 100 % Education: Not applicable Follow up recommendations:  (Will continue to follow for progression)    Speech Language Pathology  SLP chart review complete: Patient currently receiving at SLP services Patient may use [name] Speech Valve: During all waking hours (remove during sleep), During PO intake/meals, During all therapies with supervision PMSV Supervision: Intermittent MD: Please consider changing trach tube to : Smaller size Follow up Recommendations: 24 hour supervision/assistance   Physical Therapy PT Recommendation/Assessment: Patient needs continued PT services Follow Up Recommendations: CIR PT equipment:  (TBA--likely wheelchair)    Occupational Therapy OT Recommendation/Assessment: Patient needs continued OT Services Follow Up Recommendations: CIR OT Equipment: Hospital bed, Wheelchair cushion (measurements OT), Wheelchair (measurements OT), 3 in 1 bedside comode, Other (comment)    Nutritional Patient's Current Diet: Dysphagia 3; Nectar Thick liquid      Case Management/Social Work      Theatre managerrovider Trach Care Team/Provider                        Recommendations Trach Care Team Members Present-  Lorinda CreedSherry Brewer, RT,  Harlon DittyBonnie DeBlois, SLP, Joaquin CourtsKimberly Harris, RD   New trach during this adm and tolerating trach collar. Followed by ENT. Continue to follow.          Rosalio Catterton, Silva BandyDebra Anita 04/05/2016, 2:36 PM  Scribe for Team

## 2016-04-05 NOTE — Progress Notes (Signed)
Progress Note    Kendra Osborne  QMV:[id number] DOB: 09/25/1946  DOA: 02/24/2016 PCP: No PCP Per Patient   Outpatient Specialists:   None.   Brief Narrative:   Kendra Osborne is an 70 y.o. female with a PMH of goiter and hypertension who recently moved here from [name] presented to the hospital on 02/24/16 with respiratory distress in the setting of a few day history of cough, fever and chills. She was intubated on admission. 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 She was extubated and reintubated within 2 hours on 03/02/16 secondary to hypoxia and increased secretions. She underwent placement of a tracheostomy tube 03/09/16 and remained on the ventilator until 03/15/16 when she was weaned to a 24-hour trach collar. Her tracheostomy was changed by ENT 03/16/16. She developed worsening respiratory distress and aspiration pneumonia on 03/17/16 and required ongoing ventilator support. Transition to a trach collar on 03/21/16.  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 Sedating medications have been minimized. She completed a course of antibiotics on 03/02/16. She continues on a trach collar and pulmonology continues to follow.  Could not downsize the trach as she had extra long s/p total thyroidectomy and trachel compression. Plan for family and pt education and discharge home in 1 to 2 days.   Aspiration event  Has been tx for aspiration pneumonitis - SLP following dysphagia -  Recommended dysphagia 3 diet.   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  Altered mental status Back to baseline. B12 + folate normal - RPR and HIV negative  - ammonia not signif elevated - ?ICU psychosis / sundowning - seroquel low dose QHS only for now. Much improved today.    HTN  BP reasonably well controlled.  Chronic grade 1 diastolic CHF Continue to monitor fluid volume status. Continue Lasix 40 mg twice a day.  Acute Renal Failure (NO CKD) Resolved.  Dyspahgia PEG tube placed 03/15/16. Has been managed with tube feeding via PEG. Speech therapy to reevaluate.  Postoperative Hypothyroidism S/P Total Thyroidectomy - previously normal TSH. Continue Synthroid. Recheck TSH in 6-8 weeks as adjustment of dose likely to be required .  Obesity - Body mass index is 42.02 kg/(m^2).      Family Communication/Anticipated D/C date and plan/Code Status   DVT prophylaxis: Heparin ordered. Code Status: Full Code.  Family Communication: No family currently at the bedside. Disposition Plan: possibly home in 1 to 2 days when she is more stronger.    Medical Consultants:    ENT  Pulmonology  Interventional Radiology   Procedures:   02/26/16 TTE EF 60%-65% - grade 1 diastolic dysfunction 02/28/16 Thyroidectomy 3/24 17 - Extubation & reintubation within 2 hours due to hypoxia & secretions 03/15/16 24 hr trach collar 03/15/16 IR GASTROSTOMY TUBE  03/16/16 tracheostomy tube change - old 6 cuffed proximal XLT shiley was removed with the old silk sutures, and a new 6 cuffless proximal XLT shiley was placed  03/17/16 transferred back to icu for resp distress/asp pna. Trach changed to #6 cuffed XLT at bedside. 03/21/16 trach collar 03/26/16 #6 cuffed XLT change to #6 cuffless XLT  Anti-Infectives:    Unasyn 3/24 > 4/3;4/8 > 4/18  Ancef 3/21 > 3/24  Clindamycin 3/21   Azithromycin 3/18 >  3/19  Rocephin 3/18 > 3/20  Subjective:    Kendra Osborne reports some abdominal discomfort.   Objective:    Filed Vitals:   04/05/16 1036 04/05/16 1250 04/05/16 1513 04/05/16 1638  BP:   158/60   Pulse: 60 69 74 68  Temp:   98.4 F (36.9 C)    TempSrc:   Oral   Resp: Height:      Weight:      SpO2:    100%    Intake/Output Summary (Last 24 hours) at 04/05/16 1910 Last data filed at 04/05/16 1342  Gross per 24 hour  Intake    480 ml  Output   1450 ml  Net   -970 ml   Filed Weights   04/02/16 0320 04/03/16 0500 04/05/16 0552  Weight: 97.6 kg (215 lb 2.7 oz) 93.8 kg (206 lb 12.7 oz) 97.1 kg (214 lb 1.1 oz)    Exam: General exam: Appears calm and comfortable.  Respiratory system: Occasional rhonchi. Respiratory effort normal. Trach in place with trach collar on. Cardiovascular system: S1 & S2 heard, RRR. No JVD, murmurs, rubs, gallops or clicks. No pedal edema. Gastrointestinal system: Abdomen is nondistended, soft and nontender. No organomegaly or masses felt. Normal bowel sounds heard. Central nervous system: Alert and oriented. No focal neurological deficits. Extremities: Symmetric 4 x 5 power. No clubbing, edema, or cyanosis. Skin: No rashes, lesions or ulcers Psychiatry: Judgement and insight appear normal. Mood & affect appropriate.   Data Reviewed:   I have personally reviewed following labs and imaging studies:  Labs: Basic Metabolic Panel:  Recent Labs Lab 04/01/16 0552 04/02/16 0417 04/05/16 0450  NA 135 139 136  K 3.3* 3.7 3.6  CL 87* 92* 91*  CO2 36* 37* 36*  GLUCOSE 154* 114* 110*  BUN 41* 40* 32*  CREATININE 0.87 0.74 0.80  CALCIUM 8.5* 8.6* 8.5*   GFR Estimated Creatinine Clearance: 70.2 mL/min (by C-G formula based on Cr of 0.8). Liver Function Tests:  Recent Labs Lab 04/01/16 0552  AST 33  ALT 22  ALKPHOS 65  BILITOT 0.5  PROT 7.3  ALBUMIN 2.1*   No results for input(s): AMMONIA in the last 168 hours. CBC:  Recent Labs Lab 04/01/16 0552 04/05/16 0450  WBC 12.9* 10.0  HGB 9.7* 9.3*  HCT 32.8* 31.3*  MCV 87.5 87.4  PLT 335 292   CBG:  Recent Labs Lab 04/05/16 0013 04/05/16 0422 04/05/16 0814 04/05/16 1207 04/05/16 1714  GLUCAP 137* 112* 113*  126* 102*   Urine analysis:    Component Value Date/Time   COLORURINE YELLOW 02/25/2016 0124   APPEARANCEUR CLEAR 02/25/2016 0124   LABSPEC 1.046* 02/25/2016 0124   PHURINE 5.5 02/25/2016 0124   GLUCOSEU NEGATIVE 02/25/2016 0124   HGBUR NEGATIVE 02/25/2016 0124   BILIRUBINUR NEGATIVE 02/25/2016 0124   KETONESUR NEGATIVE 02/25/2016 0124   PROTEINUR 100* 02/25/2016 0124   NITRITE NEGATIVE 02/25/2016 0124   LEUKOCYTESUR NEGATIVE 02/25/2016 0124   Microbiology No results found for this or any previous visit (from the past 240 hour(s)).  Radiology: Dg Abd Portable 1v  04/05/2016  CLINICAL DATA:  Constipation, abdominal distention and bloating. EXAM: PORTABLE ABDOMEN - 1 VIEW COMPARISON:  04/03/2016 FINDINGS: Contrast throughout the colon from recent modified barium swallow 2 days ago. No significant obstruction pattern, significant dilatation, or ileus. Colonic stool burden within normal limits for age. Degenerative changes of the spine with a mild scoliosis. No abnormal calcifications. IMPRESSION: Negative for obstruction or  ileus. Electronically Signed   By: Judie Petit.  Shick M.D.   On: 04/05/2016 11:27    Medications:   . bacitracin   Topical TID  . chlorhexidine  15 mL Mouth Rinse BID  . docusate  100 mg Oral BID  . feeding supplement (GLUCERNA SHAKE)  237 mL Oral TID BM  . furosemide  40 mg Per Tube BID  . heparin subcutaneous  5,000 Units Subcutaneous 3 times per day  . levothyroxine  100 mcg Oral QAC breakfast  . oxymetazoline  2 spray Each Nare TID  . pantoprazole sodium  40 mg Per Tube Daily  . polyethylene glycol  17 g Per Tube Daily   Continuous Infusions: . sodium chloride 10 mL/hr at 04/02/16 2158    Time spent: 25 minutes.      LOS: 40 days   Kendra Osborne  [facility] Pager [phone] *Please refer to [email or web address], password [email or web address] to get updated schedule on who will round on this patient, as hospitalists switch teams weekly. If 7PM-7AM, please contact  night-coverage at [email or web address], password [email or web address] for any overnight needs.  04/05/2016, 7:10 PM

## 2016-04-05 NOTE — Progress Notes (Signed)
04/05/2016- Respiratory care note- Pt has a #6XLT trach in place.  Pt currently on 28% ATC and tolerating well.  A good strong cough noted at time of eval.  Pt tolerating PMV.  No family in room for trach education at time of eval.  Will cont to follow progress.

## 2016-04-05 NOTE — Progress Notes (Signed)
Calorie Count Note  48 hour calorie count ordered.  Diet: Dysphagia 3, nectar thick liquids Supplements: Glucerna Shake po TID, each supplement provides 220 kcal and 10 grams of protein  Day 1 Breakfast: 490 kcals, 15 grams protein Lunch: 473 kcals, 11 grams protein Dinner: 522 kcals, 19 grams protein Supplements: 220 kcals, 10 grams protein  Total intake: 1705 kcal (>100% of minimum estimated needs)  55 protein (55% of minimum estimated needs)  Day 2 Breakfast: 239 kcals, 7 grams protein Lunch: 0% (pt refused) Dinner: n/a Supplements: 220 kcals, 10 grams protein  Nutrition Dx: Inadequate oral intake related to dysphagia as evidenced by PO <25%; Ongoing  Goal: Pt will meet >90% of estimated nutritional needs; progressing  Intervention:   -Continue with Glucerna Shake po TID, each supplement provides 220 kcal and 10 grams of protein -RD will follow-up with calorie count results on 04/06/16  Shirly Bartosiewicz A. Mayford KnifeWilliams, RD, LDN, CDE Pager: [phone] After hours Pager: [phone]

## 2016-04-05 NOTE — Progress Notes (Signed)
Occupational Therapy Treatment Patient Details Name: Kendra Osborne MRN: [id number] DOB: Jan 31, 1946 Today's Date: 04/05/2016    History of present illness pt presents with Large thyroid mass/goiter and Respiratory Difficulties.  pt has underwent Total Thyroidectomy with goiter removal and has remained intubated with one failed extubation on 3/24.  pt recently came to the [place] from [place] and only known medical hx is HTN and Goiter.     OT comments  Pt making slow progress with adls and mobility.  Pt appeared stronger today holding self on EOB without assist, scooting up in bed with little assist and grooming on EOB.  Pt demonstrates the strength to be more mobile than she is.  Pt very distracted by abdominal pain today.  Pt did better using son as Optometrist on phone but still was not able to get all needs met using him to translate as demonstrated by her pointed to bed and possibly having other needs once he was off phone.  Unsure if son is relaying to [place] all she is telling him.  Pt did stand with +1 assist for a few seconds but immediately returned to sitting on EOB and would not attempt again, requiring two people to get her positioned back in bed.  Mobility status seems to fluctuate.  Goals updated.  Will cont with original goals at this time.   Follow Up Recommendations  CIR    Equipment Recommendations  Hospital bed;Wheelchair cushion (measurements OT);Wheelchair (measurements OT);3 in 1 bedside comode;Other (comment)    Recommendations for Other Services      Precautions / Restrictions Precautions Precautions: Fall Precaution Comments: trach collar, peg Restrictions Weight Bearing Restrictions: No       Mobility Bed Mobility Overal bed mobility: Needs Assistance Bed Mobility: Sit to Supine;Supine to Sit   Sidelying to sit: Min assist;HOB elevated   Sit to supine: Mod assist;+2 for physical assistance;+2 for safety/equipment   General bed mobility comments: PT able to get legs  in bed from sitting on side today and could scoot self up in bed in the long sitting position with very little help.  Transfers Overall transfer level: Needs assistance Equipment used: 2 person hand held assist Transfers: Sit to/from Stand Sit to Stand: +2 physical assistance;Max assist         General transfer comment: multimodal cues and use of interpreter but pt not following commands with tendency to pull on RW instead of pushing from armrests; max cues for hand placement, technique, posture, and safe use of DME; pt with flexed trunk throughout     Balance Overall balance assessment: Needs assistance Sitting-balance support: Feet supported;Bilateral upper extremity supported Sitting balance-Leahy Scale: Fair     Standing balance support: Bilateral upper extremity supported;During functional activity Standing balance-Leahy Scale: Poor Standing balance comment: PT stood momentarily with little help.  Pt could not understand how to use the walker to assist her so walker was not used.  AFter standing for appx 4 sec, pt returned to sitting on bed and could not stand again.                     ADL Overall ADL's : Needs assistance/impaired Eating/Feeding: Set up;Sitting Eating/Feeding Details (indicate cue type and reason): set up to drink from cup. Grooming: Dance movement psychotherapist;Wash/dry hands;Set up;Sitting Grooming Details (indicate cue type and reason): Pt able to do simple grooming sitting on EOB without assist.  Pt maintained balance on EOB.  Pt with difficulty reaching overhead to comb hair due to weakness.  Upper Body Bathing: Minimal assitance;Cueing for sequencing;Sitting Upper Body Bathing Details (indicate cue type and reason): Pt sat on EOB to bathe UE.  Pt required constant cues to stay on task.  Pt very distracted by abdominal pain.  Pt has the physical ability to bathe self but required VCs to stay on task.                         Functional mobility during  ADLs: Maximal assistance;+2 for physical assistance General ADL Comments: Pt has the strength to do UE adls and to stand but appeard distressed through session to point she did not seem to participate well.  Called son to translate on phone(pt sturggles with hosptial phone translator) and she stated her abdomen hurt but otherwise was fine.  Once son off the phone she started asking for other things that were not understandable but told son it was only her stomach.  Feel something is lost in translation.  Nursing aware.       Vision                     Perception     Praxis      Cognition   Behavior During Therapy: WFL for tasks assessed/performed Overall Cognitive Status: Difficult to assess     Current Attention Level: Sustained Memory: Decreased short-term memory    Safety/Judgement: Decreased awareness of safety;Decreased awareness of deficits     General Comments: Son reports pt is at baseline.  Attempted using phone interpreter and pt unable to follow any commands from this interpreter.  Called the son and son interpreted.  Had son ask at end of conversation if pt had any other needs. He said no, but pt continued to gesture for things after the session that I could not figure out.  Nursing aware.    Extremity/Trunk Assessment               Exercises General Exercises - Upper Extremity Shoulder Flexion: Strengthening;10 reps;Both;Seated Elbow Flexion: Strengthening;Both;5 reps Elbow Extension: Strengthening;Both;5 reps;Seated Digit Composite Flexion: AROM;Both;5 reps   Shoulder Instructions       General Comments      Pertinent Vitals/ Pain       Pain Assessment: Faces Faces Pain Scale: Hurts little more Pain Location: abdomen Pain Descriptors / Indicators: Grimacing Pain Intervention(s): Limited activity within patient's tolerance;Monitored during session;Repositioned  Home Living                                          Prior  Functioning/Environment              Frequency Min 2X/week     Progress Toward Goals  OT Goals(current goals can now be found in the care plan section)  Progress towards OT goals: Progressing toward goals  Acute Rehab OT Goals Patient Stated Goal: none stated OT Goal Formulation: Patient unable to participate in goal setting Time For Goal Achievement: 04/19/16 Potential to Achieve Goals: Good ADL Goals Pt Will Perform Grooming: with min assist;standing Pt Will Perform Upper Body Bathing: with supervision;sitting Pt Will Perform Lower Body Bathing: with min assist;sit to/from stand Pt Will Perform Upper Body Dressing: with min assist;sitting Pt Will Perform Lower Body Dressing: with min assist;sit to/from stand Pt Will Transfer to Toilet: with min assist;ambulating;regular height toilet;bedside commode;grab bars Pt Will Perform Toileting -  Clothing Manipulation and hygiene: with min assist;sit to/from stand Pt/caregiver will Perform Home Exercise Program: Increased strength;Both right and left upper extremity;With theraband;With Supervision;With written HEP provided  Plan Discharge plan remains appropriate    Co-evaluation                 End of Session Equipment Utilized During Treatment: Oxygen   Activity Tolerance Patient limited by pain   Patient Left in bed;with call bell/phone within reach;with nursing/sitter in room;with restraints reapplied   Nurse Communication Mobility status;Precautions        Time: 4784-1282 OT Time Calculation (min): 30 min  Charges: OT General Charges $OT Visit: 1 Procedure OT Treatments $Self Care/Home Management : 23-37 mins  Glenford Peers 04/05/2016, 12:31 PM  [phone]

## 2016-04-05 NOTE — Progress Notes (Signed)
Speech Language Pathology Treatment: Dysphagia  Patient Details Name: Kendra Osborne MRN: [id number] DOB: 01/23/1946 Today's Date: 04/05/2016 Time: [id number] SLP Time Calculation (min) (ACUTE ONLY): 40 min  Assessment / Plan / Recommendation Clinical Impression  Pt demonstrates intermittent coughing throughout session, often unrelated to PO intake. Cough is strong and effective to expectorate mucous. Pt is 100% accurate with small sips with no verbal or visual cues needed. Provided cueing to pt and staff to reposition her effectively for meals. She must be upright. Provided demonstration and explanation to son regarding thickening. He verbalized understanding, but was hoping swallow would resolve prior to d/c. At this time, pts function does not appear much improved, but pt may potentially have better function in the future. Will consider repeat MBS next week if pt still admitted and if trach is changed. If MBS is not repeated or if swallow is not improved pt will need an appointment to return for an outpatient MBS.    HPI HPI: 70 y.o. woman who recently moved from [name] (no known PMHx) who presented to ED with respiratory distress and lower extremity edema. Pt was intubated 3/17 and failed to wean, therefore trach placed. CXR 3/31 stable bibasilar atelectasis.       SLP Plan  Continue with current plan of care     Recommendations  Diet recommendations: Dysphagia 3 (mechanical soft);Nectar-thick liquid Medication Administration: Via alternative means Supervision: Intermittent supervision to cue for compensatory strategies Compensations: Slow rate;Small sips/bites Postural Changes and/or Swallow Maneuvers: Seated upright 90 degrees      Patient may use [name] Speech Valve: During all waking hours (remove during sleep);During PO intake/meals;During all therapies with supervision PMSV Supervision: Intermittent MD: Please consider changing trach tube to : Smaller size      General  recommendations: Rehab consult Oral Care Recommendations: Oral care BID Follow up Recommendations: 24 hour supervision/assistance Plan: Continue with current plan of care     GO               [facility] Venita Seng, MA CCC-SLP [phone]  Claudine MoutonDeBlois, Sulay Brymer Caroline 04/05/2016, 10:27 AM

## 2016-04-06 LAB — GLUCOSE, CAPILLARY
GLUCOSE-CAPILLARY: 112 mg/dL — AB (ref 65–99)
GLUCOSE-CAPILLARY: 121 mg/dL — AB (ref 65–99)
GLUCOSE-CAPILLARY: 129 mg/dL — AB (ref 65–99)
Glucose-Capillary: 127 mg/dL — ABNORMAL HIGH (ref 65–99)
Glucose-Capillary: 137 mg/dL — ABNORMAL HIGH (ref 65–99)
Glucose-Capillary: 141 mg/dL — ABNORMAL HIGH (ref 65–99)
Glucose-Capillary: 168 mg/dL — ABNORMAL HIGH (ref 65–99)

## 2016-04-06 NOTE — Progress Notes (Signed)
Progress Note    Kendra Osborne  ZOX:[id number] DOB: 03/28/1946  DOA: 02/24/2016 PCP: No PCP Per Patient   Outpatient Specialists:   None.   Brief Narrative:   Kendra Pootaseem Stickley is an 70 y.o. female with a PMH of goiter and hypertension who recently moved here from [name] presented to the hospital on 02/24/16 with respiratory distress in the setting of a few day history of cough, fever and chills. She was intubated on admission. 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 She was extubated and reintubated within 2 hours on 03/02/16 secondary to hypoxia and increased secretions. She underwent placement of a tracheostomy tube 03/09/16 and remained on the ventilator until 03/15/16 when she was weaned to a 24-hour trach collar. Her tracheostomy was changed by ENT 03/16/16. She developed worsening respiratory distress and aspiration pneumonia on 03/17/16 and required ongoing ventilator support. Transition to a trach collar on 03/21/16.  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 Sedating medications have been minimized. She completed a course of antibiotics on 03/02/16. She continues on a trach collar and pulmonology continues to follow.  Could not downsize the trach as she had extra long s/p total thyroidectomy and trachel compression. Plan for family and pt education and discharge home in 1 to 2 days.  Plan for discharge on Monday.   Aspiration event  Has been tx for aspiration pneumonitis - SLP following dysphagia -  Recommended dysphagia 3 diet.   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  Altered mental status Back to baseline. B12 +  folate normal - RPR and HIV negative - ammonia not signif elevated - ?ICU psychosis / sundowning - seroquel low dose QHS only for now. Much improved today.    HTN  BP reasonably well controlled.  Chronic grade 1 diastolic CHF Continue to monitor fluid volume status. Continue Lasix 40 mg twice a day.  Acute Renal Failure (NO CKD) Resolved.  Dyspahgia PEG tube placed 03/15/16. Has been managed with tube feeding via PEG. Speech therapy to reevaluate.  Postoperative Hypothyroidism S/P Total Thyroidectomy - previously normal TSH. Continue Synthroid. Recheck TSH in 6-8 weeks as adjustment of dose likely to be required .  Obesity - Body mass index is 42.02 kg/(m^2).      Family Communication/Anticipated D/C date and plan/Code Status   DVT prophylaxis: Heparin ordered. Code Status: Full Code.  Family Communication: discussed with son at bedside.  Disposition Plan: possibly home in 1 to 2 days when she is more stronger.    Medical Consultants:    ENT  Pulmonology  Interventional Radiology   Procedures:   02/26/16 TTE EF 60%-65% - grade 1 diastolic dysfunction 02/28/16 Thyroidectomy 3/24 17 - Extubation & reintubation within 2 hours due to hypoxia & secretions 03/15/16 24 hr trach collar 03/15/16 IR GASTROSTOMY TUBE  03/16/16 tracheostomy tube change - old 6 cuffed proximal XLT shiley was removed with the old silk sutures, and a new 6 cuffless proximal XLT shiley was placed  03/17/16 transferred back to icu for resp distress/asp pna. Trach changed to #6 cuffed XLT at bedside. 03/21/16 trach collar 03/26/16 #6 cuffed XLT change to #6 cuffless XLT  Anti-Infectives:    Unasyn 3/24 > 4/3;4/8 > 4/18  Ancef 3/21 > 3/24  Clindamycin  3/21   Azithromycin 3/18 > 3/19  Rocephin 3/18 > 3/20  Subjective:    Aymar Rezendes reports some abdominal discomfort.   Objective:    Filed Vitals:   04/06/16 1213 04/06/16 1356 04/06/16 1525 04/06/16 1619  BP:  164/67  104/73  Pulse:  71 69 75 75  Temp:  98.2 F (36.8 C)    TempSrc:      Resp: Height:      Weight:      SpO2: 96% 99% 94%     Intake/Output Summary (Last 24 hours) at 04/06/16 1833 Last data filed at 04/06/16 1802  Gross per 24 hour  Intake    600 ml  Output   1152 ml  Net   -552 ml   Filed Weights   04/02/16 0320 04/03/16 0500 04/05/16 0552  Weight: 97.6 kg (215 lb 2.7 oz) 93.8 kg (206 lb 12.7 oz) 97.1 kg (214 lb 1.1 oz)    Exam: General exam: Appears calm and comfortable.  Respiratory system: Occasional rhonchi. Respiratory effort normal. Trach in place with trach collar on. Cardiovascular system: S1 & S2 heard, RRR. No JVD, murmurs, rubs, gallops or clicks. No pedal edema. Gastrointestinal system: Abdomen is nondistended, soft and nontender. No organomegaly or masses felt. Normal bowel sounds heard. Central nervous system: Alert and oriented. No focal neurological deficits. Extremities: Symmetric 4 x 5 power. No clubbing, edema, or cyanosis. Skin: No rashes, lesions or ulcers Psychiatry: Judgement and insight appear normal. Mood & affect appropriate.   Data Reviewed:   I have personally reviewed following labs and imaging studies:  Labs: Basic Metabolic Panel:  Recent Labs Lab 04/01/16 0552 04/02/16 0417 04/05/16 0450  NA 135 139 136  K 3.3* 3.7 3.6  CL 87* 92* 91*  CO2 36* 37* 36*  GLUCOSE 154* 114* 110*  BUN 41* 40* 32*  CREATININE 0.87 0.74 0.80  CALCIUM 8.5* 8.6* 8.5*   GFR Estimated Creatinine Clearance: 70.2 mL/min (by C-G formula based on Cr of 0.8). Liver Function Tests:  Recent Labs Lab 04/01/16 0552  AST 33  ALT 22  ALKPHOS 65  BILITOT 0.5  PROT 7.3  ALBUMIN 2.1*   No results for input(s): AMMONIA in the last 168 hours. CBC:  Recent Labs Lab 04/01/16 0552 04/05/16 0450  WBC 12.9* 10.0  HGB 9.7* 9.3*  HCT 32.8* 31.3*  MCV 87.5 87.4  PLT 335 292   CBG:  Recent Labs Lab 04/06/16 0024 04/06/16 0412 04/06/16 0740 04/06/16 1130  04/06/16 1621  GLUCAP 137* 127* 168* 121* 141*   Urine analysis:    Component Value Date/Time   COLORURINE YELLOW 02/25/2016 0124   APPEARANCEUR CLEAR 02/25/2016 0124   LABSPEC 1.046* 02/25/2016 0124   PHURINE 5.5 02/25/2016 0124   GLUCOSEU NEGATIVE 02/25/2016 0124   HGBUR NEGATIVE 02/25/2016 0124   BILIRUBINUR NEGATIVE 02/25/2016 0124   KETONESUR NEGATIVE 02/25/2016 0124   PROTEINUR 100* 02/25/2016 0124   NITRITE NEGATIVE 02/25/2016 0124   LEUKOCYTESUR NEGATIVE 02/25/2016 0124   Microbiology No results found for this or any previous visit (from the past 240 hour(s)).  Radiology: Dg Abd Portable 1v  04/05/2016  CLINICAL DATA:  Constipation, abdominal distention and bloating. EXAM: PORTABLE ABDOMEN - 1 VIEW COMPARISON:  04/03/2016 FINDINGS: Contrast throughout the colon from recent modified barium swallow 2 days ago. No significant obstruction pattern, significant dilatation, or ileus. Colonic stool burden within normal limits for age. Degenerative changes of the spine with a mild scoliosis. No abnormal  calcifications. IMPRESSION: Negative for obstruction or ileus. Electronically Signed   By: Judie Petit.  Shick M.D.   On: 04/05/2016 11:27    Medications:   . bacitracin   Topical TID  . chlorhexidine  15 mL Mouth Rinse BID  . docusate  100 mg Oral BID  . feeding supplement (GLUCERNA SHAKE)  237 mL Oral TID BM  . furosemide  40 mg Per Tube BID  . heparin subcutaneous  5,000 Units Subcutaneous 3 times per day  . levothyroxine  100 mcg Oral QAC breakfast  . oxymetazoline  2 spray Each Nare TID  . pantoprazole sodium  40 mg Per Tube Daily  . polyethylene glycol  17 g Per Tube Daily   Continuous Infusions: . sodium chloride 10 mL/hr at 04/02/16 2158    Time spent: 25 minutes.      LOS: 41 days   Maeghan Canny  [facility] Pager [phone] *Please refer to [email or web address], password [email or web address] to get updated schedule on who will round on this patient, as hospitalists switch teams  weekly. If 7PM-7AM, please contact night-coverage at [email or web address], password [email or web address] for any overnight needs.  04/06/2016, 6:33 PM

## 2016-04-06 NOTE — Progress Notes (Signed)
Calorie Count Note  48 hour calorie count ordered.  Diet: Dysphagia 3, nectar thick liquids Supplements: Glucerna Shake po TID, each supplement provides 220 kcal and 10 grams of protein  Day 1 Breakfast: 490 kcals, 15 grams protein Lunch: 473 kcals, 11 grams protein Dinner: 522 kcals, 19 grams protein Supplements: 220 kcals, 10 grams protein  Total intake: 1705 kcal (>100% of minimum estimated needs)  55 protein (55% of minimum estimated needs)  Day 2 Breakfast: 239 kcals, 7 grams protein Lunch: 0% (pt refused) Dinner: 114 kcals, 3 grams protein Supplements: 440 kcals, 20 grams protein  Total intake: 793 kcal (53% of minimum estimated needs)  30 protein (30% of minimum estimated needs)  Average Total intake: 1282 kcal (85% of minimum estimated needs)  43 protein (43% of minimum estimated needs)  Nutrition Dx: Inadequate oral intake related to dysphagia as evidenced by PO <25%; Ongoing  Goal: Pt will meet >90% of estimated nutritional needs; progressing  Intervention:   -Continue Glucerna Shake po TID, each supplement provides 220 kcal and 10 grams of protein -Magic Cup TID with meals  Kendra Osborne, RD, LDN, CDE Pager: [phone] After hours Pager: [phone]

## 2016-04-06 NOTE — Progress Notes (Signed)
PULMONARY / CRITICAL CARE MEDICINE    Name: Kendra Osborne MRN: [id number] DOB: 02/28/1946    ADMISSION DATE:  02/24/2016  PRIMARY SERVICE: PCCM  CHIEF COMPLAINT:  Dyspnea  BRIEF PATIENT DESCRIPTION: 70 y/o woman recently moved from [place] who presented 3/17 with respiratory distress.& known goiter that has been present for years, as well as possibly hypertension. It is not clear if the goiter was ever worked up or if she ever was on regular therapy for her hypertension. Her son (who speaks [place]) report that she been having a cough for a few days as well as some fevers and chills. Had aspiration event after trach change and was placed back on cuffed trach with vent,  out to SDU.    SUBJECTIVE:   On 28% ATC, tolerating PMV -phonating well with  #6 XLT uncuffed trach.   Out of bed to chair and able to suction herself    VITAL SIGNS: Temp:  [98.2 F (36.8 C)-98.6 F (37 C)] 98.2 F (36.8 C) (04/28 1356) Pulse Rate:  [56-75] 75 (04/28 1525) Resp:  [18-20] 18 (04/28 1525) BP: (104-164)/(67-83) 164/67 mmHg (04/28 1356) SpO2:  [94 %-100 %] 94 % (04/28 1525) FiO2 (%):  [28 %] 28 % (04/28 1525)   HEMODYNAMICS:     VENTILATOR SETTINGS: Vent Mode:  [-]  FiO2 (%):  [28 %] 28 %  INTAKE / OUTPUT: Intake/Output      04/27 0701 - 04/28 0700 04/28 0701 - 04/29 0700   P.O. 360 600   Other 120    Total Intake(mL/kg) 480 (4.9) 600 (6.2)   Urine (mL/kg/hr) 1100 (0.5) 300 (0.4)   Stool 0 (0) 2 (0)   Total Output 1100 302   Net -620 +298        Stool Occurrence 6 x     PHYSICAL EXAMINATION: General:  Obese female in NAD on ATC, eating with assistance Integument:  Warm & dry. No rash on exposed skin. HEENT:  #6 XLT uncuffed trach in place, poor dentition. Does not talk despite PMV Cardiovascular:  s1s2 RRR Pulmonary:  Non-labored, lungs bilaterally coarse, scattered rhonchi, decreased in bases Abdomen: Soft, Normal bowel sounds. Nondistended. PEG.  Neurological: Spontaneously  moving all 4 extremities. Eyes open. Appears grossly nonfocal. Follows simple gestures ie. take a big breath with prompting.    LABS:  CBC  Recent Labs Lab 04/01/16 0552 04/05/16 0450  WBC 12.9* 10.0  HGB 9.7* 9.3*  HCT 32.8* 31.3*  PLT 335 292   BMET  Recent Labs Lab 04/01/16 0552 04/02/16 0417 04/05/16 0450  NA 135 139 136  K 3.3* 3.7 3.6  CL 87* 92* 91*  CO2 36* 37* 36*  BUN 41* 40* 32*  CREATININE 0.87 0.74 0.80  GLUCOSE 154* 114* 110*   Electrolytes  Recent Labs Lab 04/01/16 0552 04/02/16 0417 04/05/16 0450  CALCIUM 8.5* 8.6* 8.5*     Imaging Dg Abd Portable 1v  04/05/2016  CLINICAL DATA:  Constipation, abdominal distention and bloating. EXAM: PORTABLE ABDOMEN - 1 VIEW COMPARISON:  04/03/2016 FINDINGS: Contrast throughout the colon from recent modified barium swallow 2 days ago. No significant obstruction pattern, significant dilatation, or ileus. Colonic stool burden within normal limits for age. Degenerative changes of the spine with a mild scoliosis. No abnormal calcifications. IMPRESSION: Negative for obstruction or ileus. Electronically Signed   By: Judie Petit.  Shick M.D.   On: 04/05/2016 11:27      SIGNIFICANT EVENTS: 3/17 - Admit 3/21 - Thyroidectomy 3/24 - Extubation & reintubation  within 2 hours due to hypoxia & secretions 4/06 -  24 hr trach collar 4/07- Trach change by ENT to 6 uncuffed proximal XLT 4/08 - Transferred back to ICU for resp distress/asp pna. Trach changed to #6 cuffed XLT at bedside.  4/12 - Trach collar 4/17 - Changed trach to 6 cuffless 4/24 - Tolerating 28% ATC  STUDIES:  CT-A of Chest >> mild mediastinal enlargement CT of the Neck with contrast >> multinodular goiter, meningioma Port CXR 3/22 >>   ETT & CVL in good position. Blunting bilateral costophrenic angles suggestive of effusions. Bilateral hilar fullness & interstitial prominence with low lung volumes.  Port CXR 3/25 >> Silhouetting of the left hemidiaphragm. Platelike  atelectasis right lower lung. Endotracheal tube in good position. Ort CXR 4/8 >> incr bibasilar atelectasis 4/12 pCXR >> persistent elevation of right hemidiaphragm, bibasilar atelectasis vs infltrate  LINES / TUBES: OETT 7.5 3/17 - 3/24; 7.0 3/24>>>3/31 Trach (ENT) 3/31 >> L Subclavian CVL 3/21 >> L Port 3/21>>> OGT 3/21 - 3/24; 3/24 >> Foley 3/18 >> L Radial Art Line 3/21 - 3/23 R & L Neck Drains 3/21 - ???  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 S/P total thyroidectomy 3/21. Elevated Right Hemidiaphragm  Dyspahgia  Tracheostomy Status - #6 cuffless trach since 4/17  Plan: RN/RT to teach family re: trach care. Patient will not be able to be placed in a facility.   We discussed with ENT-who recommended not to downsize since she has extra long, status post total thyroidectomy for large goiter and tracheal compression  -PMV valve MBS 4/25 >> dys 3 diet ATC 28%, wean to room air for sats > 90%  Push pulmonary hygiene - mobilize, deep breathing etc  Unfortunately her main issue now is severe deconditioning-she needs intensive rehabilitation and physical therapy Her recovery has been compounded by communication issues due to language barrier and some element,I suspect, of depression based on her acute illness. I am hopeful that she will make a full recovery and I tried to convey this to her. [place] her grandson's [place] and is still hoping to make it  She will need follow-up with ENT as outpatient for eventual decannulation  Cyril Mourningakesh  Alva MD. FCCP. [facility] Pager [phone] If no response call [phone]    04/06/2016, 3:50 PM

## 2016-04-06 NOTE — Progress Notes (Signed)
UR COMPLETED  

## 2016-04-06 NOTE — Progress Notes (Signed)
Physical Therapy Treatment Patient Details Name: Kendra Osborne MRN: [id number] DOB: 12/31/1945 Today's Date: 04/06/2016    History of Present Illness pt presents with Large thyroid mass/goiter and Respiratory Difficulties.  pt has underwent Total Thyroidectomy with goiter removal and has remained intubated with one failed extubation on 3/24.  [name] and only known medical hx is HTN and Goiter.      PT Comments    Patient is making gradual progress toward mobility goals but unable to take any steps and becomes fatigued quickly when standing.  Due to pt's current mobility level and anticipated d/c home with assistance from family, pt will need w/c for safe mobility. Continue to progress as tolerated until d/c.   Follow Up Recommendations  Home health PT; supervision 24 hour     Equipment Recommendations  Wheelchair (measurements PT);Wheelchair cushion (measurements PT)    Recommendations for Other Services       Precautions / Restrictions Precautions Precautions: Fall Precaution Comments:  (trach peg) Restrictions Weight Bearing Restrictions: No    Mobility  Bed Mobility               General bed mobility comments: [facility] with nursing staff present upon arrival  Transfers Overall transfer level: Needs assistance Equipment used: Rolling walker (2 wheeled) Transfers: Sit to/from [employer];Squat Pivot Transfers Sit to Stand: Mod assist;Min assist;+2 physical assistance;+2 safety/equipment   Squat pivot transfers: Min assist;+2 safety/equipment     General transfer comment: cues for hand placement and technique with assist needed for power up into standing and tactile cues for correcting posture and for safe use of DME; pt leaned on RW with forearms and fatigued quickly; squat pivot BSC to recliner and sit to stand from recliner X3 trials with less need for assistance to stand with more practice  Ambulation/Gait             General  Gait Details: unable at this time   Stairs            Wheelchair Mobility    Modified Rankin (Stroke Patients Only)       Balance Overall balance assessment: Needs assistance   Sitting balance-Leahy Scale: Fair       Standing balance-Leahy Scale: Poor                      Cognition Arousal/Alertness: Awake/alert Behavior During Therapy: WFL for tasks assessed/performed Overall Cognitive Status: Difficult to assess                      Exercises      General Comments General comments (skin integrity, edema, etc.): pt followed commands inconsistently despite use of interpreter and is easily distracted from task      Pertinent Vitals/Pain Pain Assessment: Faces Faces Pain Scale: Hurts even more Pain Location: abdomen and R LE Pain Descriptors / Indicators: Grimacing;Guarding Pain Intervention(s): Limited activity within patient's tolerance;Monitored during session;Repositioned    Home Living                      Prior Function            PT Goals (current goals can now be found in the care plan section) Acute Rehab PT Goals Patient Stated Goal: get stronger Time For Goal Achievement: 04/11/16 Progress towards PT goals: Progressing toward goals    Frequency  Min 3X/week    PT Plan Current plan remains  appropriate    Co-evaluation             End of Session Equipment Utilized During Treatment: Oxygen;Gait belt Activity Tolerance: Patient tolerated treatment well Patient left: with call bell/phone within reach;in chair;with chair alarm set     Time: 1426-1500 PT Time Calculation (min) (ACUTE ONLY): 34 min  Charges:  $Therapeutic Activity: 23-37 mins                    G Codes:      Derek Mound, PTA Pager: [phone]   04/06/2016, 3:28 PM

## 2016-04-06 NOTE — Hospital Discharge Follow-Up (Signed)
[facility]:  This Case Manager received call from Gae GallopAngela Cole, RN CM who indicated patient uninsured and needing hospital follow-up appointment. She indicated patient will be discharged with trach in place and patient's family receiving trach care education. Home health services planned after discharge. Hospital follow-up appointment scheduled for 04/16/16 at 1445 with Dr. Venetia NightAmao. AVS updated. Gae GallopAngela Cole, RN CM also updated.

## 2016-04-07 DIAGNOSIS — R1084 Generalized abdominal pain: Secondary | ICD-10-CM

## 2016-04-07 LAB — GLUCOSE, CAPILLARY
GLUCOSE-CAPILLARY: 103 mg/dL — AB (ref 65–99)
GLUCOSE-CAPILLARY: 115 mg/dL — AB (ref 65–99)
GLUCOSE-CAPILLARY: 147 mg/dL — AB (ref 65–99)
GLUCOSE-CAPILLARY: 126 mg/dL — AB (ref 65–99)

## 2016-04-07 MED ORDER — ALUM & MAG HYDROXIDE-SIMETH 200-200-20 MG/5ML PO SUSP: 30.0000 mL | Freq: Four times a day (QID) | ORAL | Status: DC | PRN

## 2016-04-07 MED ORDER — PANTOPRAZOLE SODIUM 40 MG PO PACK
40.0000 mg | PACK | Freq: Two times a day (BID) | ORAL | Status: DC
  Administered 2016-04-07 – 2016-04-09 (×4): 40 mg
  Filled 2016-04-07 (×4): qty 20

## 2016-04-07 NOTE — Care Management Note (Addendum)
Case Management Note  Patient Details  Name: Kendra Osborne MRN: [id number] Date of Birth: 06/20/1946  Subjective/Objective:                  Per MD note: PMH of goiter and hypertension; from [name] admitted on 02/24/16 with respiratory distress;  intubated on admission. Possible viral bronchitis/pneumonitis, CHF,  extrinsic airway compression secondary to goiter and underwent a thyroidectomy on 02/28/16. She was extubated and reintubated within 2 hours on 03/02/16 secondary to hypoxia and increased secretions. She underwent placement of a tracheostomy tube 03/09/16 and remained on the vent until 03/15/16 and then weaned to a 24-hour trach collar. Transitioned to trach collar on 03/21/16.  Action/Plan: Cm reviewed PT/OT notes and recommendations made for Wheelchair, 3N1, hospital bed and MD made orders for all. [facility] and RW was ordered but can't get both wheelchair and walker so son chose Wheelchair, hospital bed, and 3N1. CM called Tiffany with [facility] to advise of DME needs since they will be delivered at home and message accepted. CM spoke to RN Maralyn SagoSarah about home oxygen as patient currently on oxygen therefore she will complete oxygen saturation note for possible home oxygen needs. CM will arrange for PTAR once discharge orders obtained. HH PT reccommended but patient does not qualify therefore CM will arrange outpatient PT with patient/family permission.   Cm confirmed home address with son to be [address] [place], [place] [address]. CM will follow for possible oxygen needs and transportation.   Expected Discharge Date:  04/08/16               Expected Discharge Plan:  Home w Home Health Services  In-House Referral:     Discharge planning Services  CM Consult  Post Acute Care Choice:  Durable Medical Equipment, Home Health Choice offered to:  Patient, Adult Children  DME Arranged:  Trach supplies, Suction, 3-N-1, Hospital bed, Wheelchair manual DME Agency:  [facility]  HH Arranged:  RN, Respirator Therapy HH Agency:  [facility]  Status of Service:  In process, will continue to follow  Medicare Important Message Given:    Date Medicare IM Given:    Medicare IM give by:    Date Additional Medicare IM Given:    Additional Medicare Important Message give by:     If discussed at Long Length of Stay Meetings, dates discussed:    Additional Comments:  Darcel SmallingAnna C Khayden Herzberg, RN 04/07/2016, 2:52 PM

## 2016-04-07 NOTE — Progress Notes (Signed)
Progress Note    Kendra Pootaseem Spoonemore  GUY:[id number] DOB: 08/17/1946  DOA: 02/24/2016 PCP: No PCP Per Patient   Outpatient Specialists:   None.   Brief Narrative:   Kendra Osborne is an 70 y.o. female with a PMH of goiter and hypertension who recently moved here from [name] presented to the hospital on 02/24/16 with respiratory distress in the setting of a few day history of cough, fever and chills. She was intubated on admission. 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 She was extubated and reintubated within 2 hours on 03/02/16 secondary to hypoxia and increased secretions. She underwent placement of a tracheostomy tube 03/09/16 and remained on the ventilator until 03/15/16 when she was weaned to a 24-hour trach collar. Her tracheostomy was changed by ENT 03/16/16. She developed worsening respiratory distress and aspiration pneumonia on 03/17/16 and required ongoing ventilator support. Transition to a trach collar on 03/21/16.  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 Sedating medications have been minimized. She completed a course of antibiotics on 03/02/16. She continues on a trach collar and pulmonology continues to follow.  Could not downsize the trach as she had extra long s/p total thyroidectomy and trachel compression. Plan for family and pt education and discharge home in 1 to 2 days.  Plan for discharge tomorrow  Aspiration event  Has been tx for aspiration pneumonitis - SLP following dysphagia -  Recommended dysphagia 3 diet.   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  Altered mental status Back to baseline. B12 + folate  normal - RPR and HIV negative - ammonia not signif elevated - ?ICU psychosis / sundowning - seroquel low dose QHS only for now. Much improved today.    HTN  BP reasonably well controlled.  Chronic grade 1 diastolic CHF Continue to monitor fluid volume status. Continue Lasix 40 mg twice a day.  Acute Renal Failure (NO CKD) Resolved.  Dyspahgia PEG tube placed 03/15/16. Has been managed with tube feeding via PEG. Speech therapy to reevaluate.  Postoperative Hypothyroidism S/P Total Thyroidectomy - previously normal TSH. Continue Synthroid. Recheck TSH in 6-8 weeks as adjustment of dose likely to be required .  Obesity - Body mass index is 42.02 kg/(m^2).  Abdominal discomfort, bloating : increased protonix BID and added mylanta.        Family Communication/Anticipated D/C date and plan/Code Status   DVT prophylaxis: Heparin ordered. Code Status: Full Code.  Family Communication: discussed with son at bedside.  Disposition Plan: possibly home in 1 to 2 days when she is more stronger.    Medical Consultants:    ENT  Pulmonology  Interventional Radiology   Procedures:   02/26/16 TTE EF 60%-65% - grade 1 diastolic dysfunction 02/28/16 Thyroidectomy 3/24 17 - Extubation & reintubation within 2 hours due to hypoxia & secretions 03/15/16 24 hr trach collar 03/15/16 IR GASTROSTOMY TUBE  03/16/16 tracheostomy tube change - old 6 cuffed proximal XLT shiley was removed with the old silk sutures, and a new 6 cuffless proximal XLT shiley was placed  03/17/16 transferred back to icu for resp distress/asp pna. Trach changed to #6 cuffed XLT at bedside. 03/21/16 trach collar 03/26/16 #6 cuffed XLT change to #6 cuffless XLT  Anti-Infectives:    Unasyn 3/24 >  4/3;4/8 > 4/18  Ancef 3/21 > 3/24  Clindamycin 3/21   Azithromycin 3/18 > 3/19  Rocephin 3/18 > 3/20  Subjective:    Kendra Osborne reports some abdominal discomfort and bloating.  She wants to know when the g tube  can be removed.   Objective:    Filed Vitals:   04/07/16 0957 04/07/16 1214 04/07/16 1243 04/07/16 1540  BP:  136/65    Pulse: 75 70 70 78  Temp:  99 F (37.2 C)    TempSrc:  Oral    Resp: Height:      Weight:      SpO2: 100% 100% 100% 98%    Intake/Output Summary (Last 24 hours) at 04/07/16 1907 Last data filed at 04/07/16 1453  Gross per 24 hour  Intake     10 ml  Output    351 ml  Net   -341 ml   Filed Weights   04/03/16 0500 04/05/16 0552 04/07/16 0438  Weight: 93.8 kg (206 lb 12.7 oz) 97.1 kg (214 lb 1.1 oz) 95.6 kg (210 lb 12.2 oz)    Exam: General exam: Appears calm and comfortable.  Respiratory system: Occasional rhonchi. Respiratory effort normal. Trach in place with trach collar on. Cardiovascular system: S1 & S2 heard, RRR. No JVD, murmurs, rubs, gallops or clicks. No pedal edema. Gastrointestinal system: Abdomen is nondistended, soft and nontender. No organomegaly or masses felt. Normal bowel sounds heard. Central nervous system: Alert and oriented. No focal neurological deficits. Extremities: Symmetric 4 x 5 power. No clubbing, edema, or cyanosis. Skin: No rashes, lesions or ulcers Psychiatry: Judgement and insight appear normal. Mood & affect appropriate.   Data Reviewed:   I have personally reviewed following labs and imaging studies:  Labs: Basic Metabolic Panel:  Recent Labs Lab 04/01/16 0552 04/02/16 0417 04/05/16 0450  NA 135 139 136  K 3.3* 3.7 3.6  CL 87* 92* 91*  CO2 36* 37* 36*  GLUCOSE 154* 114* 110*  BUN 41* 40* 32*  CREATININE 0.87 0.74 0.80  CALCIUM 8.5* 8.6* 8.5*   GFR Estimated Creatinine Clearance: 69.6 mL/min (by C-G formula based on Cr of 0.8). Liver Function Tests:  Recent Labs Lab 04/01/16 0552  AST 33  ALT 22  ALKPHOS 65  BILITOT 0.5  PROT 7.3  ALBUMIN 2.1*   No results for input(s): AMMONIA in the last 168 hours. CBC:  Recent Labs Lab 04/01/16 0552 04/05/16 0450  WBC 12.9* 10.0  HGB  9.7* 9.3*  HCT 32.8* 31.3*  MCV 87.5 87.4  PLT 335 292   CBG:  Recent Labs Lab 04/06/16 2024 04/06/16 2355 04/07/16 0834 04/07/16 1155 04/07/16 1638  GLUCAP 112* 129* 103* 115* 147*   Urine analysis:    Component Value Date/Time   COLORURINE YELLOW 02/25/2016 0124   APPEARANCEUR CLEAR 02/25/2016 0124   LABSPEC 1.046* 02/25/2016 0124   PHURINE 5.5 02/25/2016 0124   GLUCOSEU NEGATIVE 02/25/2016 0124   HGBUR NEGATIVE 02/25/2016 0124   BILIRUBINUR NEGATIVE 02/25/2016 0124   KETONESUR NEGATIVE 02/25/2016 0124   PROTEINUR 100* 02/25/2016 0124   NITRITE NEGATIVE 02/25/2016 0124   LEUKOCYTESUR NEGATIVE 02/25/2016 0124   Microbiology No results found for this or any previous visit (from the past 240 hour(s)).  Radiology: No results found.  Medications:   . bacitracin   Topical TID  . chlorhexidine  15 mL Mouth Rinse BID  . docusate  100 mg Oral BID  . feeding supplement (GLUCERNA SHAKE)  237  mL Oral TID BM  . furosemide  40 mg Per Tube BID  . heparin subcutaneous  5,000 Units Subcutaneous 3 times per day  . levothyroxine  100 mcg Oral QAC breakfast  . oxymetazoline  2 spray Each Nare TID  . pantoprazole sodium  40 mg Per Tube BID  . polyethylene glycol  17 g Per Tube Daily   Continuous Infusions: . sodium chloride 10 mL/hr at 04/02/16 2158    Time spent: 25 minutes.      LOS: 42 days   Kendra Osborne  [facility] Pager [phone] *Please refer to [email or web address], password [email or web address] to get updated schedule on who will round on this patient, as hospitalists switch teams weekly. If 7PM-7AM, please contact night-coverage at [email or web address], password [email or web address] for any overnight needs.  04/07/2016, 7:07 PM

## 2016-04-07 NOTE — Care Management Note (Addendum)
Case Management Note  Patient Details  Name: Juliane Pootaseem Wojciak MRN: [id number] Date of Birth: 07/09/1946  Subjective/Objective:     Admitted with dyspnea. 70 y/o woman (non english speaking), recently moved from [name] who presented 3/17 with respiratory distress.& known goiter. S/P total thyroidectomy 02/28/16.  Tracheostomy tube placed on  03/09/16 2/2 to hypoxia. PTA  Independent with ADL's and no DME usage.              .   Action/Plan: Plan is for pt to d/c to home with son Pascal Lux(Naveed Alam  [phone]) with home health services in place. Pt with no insurance, [facility] [phone](Tiffany [phone]) [facility]. DME : [facility]. CM made pt's son aware.  Pt without PCP, refer to AVS, post hospital f/u  [facility] @ 2:45 pm with Dr.Amao.   Expected Discharge Date:                 Expected Discharge Plan:    In-House Referral:     Discharge planning Services  CM Consult  Post Acute Care Choice:    Choice offered to:   charity case  DME Arranged:  Trach supplies, Suction( delivered to pt's bedside 04/06/2016)[facility] [phone]. DME Agency:  [facility]  HH Arranged:  RN, Respirator Therapy/ referral made with Tiffany([facility]) [phone] [facility]  [facility]  Status of Service:  In process, will continue to follow  Medicare Important Message Given:    Date Medicare IM Given:    Medicare IM give by:    Date Additional Medicare IM Given:    Additional Medicare Important Message give by:     If discussed at Long Length of Stay Meetings, dates discussed:    Additional Comments:  Epifanio LeschesCole, [place] Hudson, [place],BSN,CM [phone] 04/07/2016, 5:11 AM

## 2016-04-08 LAB — GLUCOSE, CAPILLARY
GLUCOSE-CAPILLARY: 108 mg/dL — AB (ref 65–99)
GLUCOSE-CAPILLARY: 89 mg/dL (ref 65–99)
Glucose-Capillary: 101 mg/dL — ABNORMAL HIGH (ref 65–99)
Glucose-Capillary: 138 mg/dL — ABNORMAL HIGH (ref 65–99)
Glucose-Capillary: 138 mg/dL — ABNORMAL HIGH (ref 65–99)

## 2016-04-08 NOTE — Progress Notes (Signed)
Progress Note    Kendra Osborne  WGN:[id number] DOB: 05/24/1946  DOA: 02/24/2016 PCP: No PCP Per Patient   Outpatient Specialists:   None.   Brief Narrative:   Kendra Pootaseem Vetsch is an 70 y.o. female with a PMH of goiter and hypertension who recently moved here from [name] presented to the hospital on 02/24/16 with respiratory distress in the setting of a few day history of cough, fever and chills. She was intubated on admission. 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 She was extubated and reintubated within 2 hours on 03/02/16 secondary to hypoxia and increased secretions. She underwent placement of a tracheostomy tube 03/09/16 and remained on the ventilator until 03/15/16 when she was weaned to a 24-hour trach collar. Her tracheostomy was changed by ENT 03/16/16. She developed worsening respiratory distress and aspiration pneumonia on 03/17/16 and required ongoing ventilator support. Transition to a trach collar on 03/21/16.  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 Sedating medications have been minimized. She completed a course of antibiotics on 03/02/16. She continues on a trach collar and pulmonology continues to follow.  Could not downsize the trach as she had extra long s/p total thyroidectomy and trachel compression. Plan for family and pt education and discharge home in 1 to 2 days.  Plan for discharge tomorrow  Aspiration event  Has been tx for aspiration pneumonitis - SLP following dysphagia -  Recommended dysphagia 3 diet.  g tube placed this admission by IR, pt wants the G TUBE out, . Tried to discuss with the patient , the G tube can be taken out when her swallowing  improves.  Reevaluation by SLP in am.   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  Altered mental status Back to baseline. B12 + folate normal - RPR and HIV negative - ammonia not signif elevated - ?ICU psychosis / sundowning - seroquel low dose QHS only for now.  Resolved. She is alert and oriented.   HTN  BP reasonably well controlled.  Chronic grade 1 diastolic CHF Continue to monitor fluid volume status. Continue Lasix 40 mg twice a day.  Acute Renal Failure (NO CKD) Resolved.  Dyspahgia PEG tube placed 03/15/16. Has been managed with tube feeding via PEG. Speech therapy to reevaluate.  Postoperative Hypothyroidism S/P Total Thyroidectomy - previously normal TSH. Continue Synthroid. Recheck TSH in 6-8 weeks as adjustment of dose likely to be required .  Obesity - Body mass index is 42.02 kg/(m^2).  Abdominal discomfort, bloating : increased protonix BID and added mylanta.        Family Communication/Anticipated D/C date and plan/Code Status   DVT prophylaxis: Heparin ordered. Code Status: Full Code.  Family Communication: discussed with son at bedside on 4/28.  Disposition Plan: home in am.    Medical Consultants:    ENT  Pulmonology  Interventional Radiology   Procedures:   02/26/16 TTE EF 60%-65% - grade 1 diastolic dysfunction 02/28/16 Thyroidectomy 3/24 17 - Extubation & reintubation within 2 hours due to hypoxia & secretions 03/15/16 24 hr trach collar 03/15/16 IR GASTROSTOMY TUBE  03/16/16 tracheostomy tube change - old 6 cuffed proximal XLT shiley was removed with the old silk sutures, and a new 6 cuffless proximal XLT shiley was placed  03/17/16  transferred back to icu for resp distress/asp pna. Trach changed to #6 cuffed XLT at bedside. 03/21/16 trach collar 03/26/16 #6 cuffed XLT change to #6 cuffless XLT  Anti-Infectives:    Unasyn 3/24 > 4/3;4/8 > 4/18  Ancef 3/21 > 3/24  Clindamycin 3/21    Azithromycin 3/18 > 3/19  Rocephin 3/18 > 3/20  Subjective:    Starlyn Piedra reports some abdominal discomfort and bloating.  She wants to know when the g tube can be removed. Still on dysphagia 3 diet.   Objective:    Filed Vitals:   04/08/16 0933 04/08/16 1156 04/08/16 1400 04/08/16 1601  BP:   151/70   Pulse: 74 75 69 71  Temp:   98.3 F (36.8 C)   TempSrc:   Oral   Resp: Height:      Weight:      SpO2: 98% 98% 98% 94%   No intake or output data in the 24 hours ending 04/08/16 1736 Filed Weights   04/05/16 0552 04/07/16 0438 04/08/16 0403  Weight: 97.1 kg (214 lb 1.1 oz) 95.6 kg (210 lb 12.2 oz) 94.892 kg (209 lb 3.2 oz)    Exam: General exam: Appears calm and comfortable.  Respiratory system: Occasional rhonchi. Respiratory effort normal. Trach in place with trach collar on. Cardiovascular system: S1 & S2 heard, RRR. No JVD, murmurs, rubs, gallops or clicks. No pedal edema. Gastrointestinal system: Abdomen is nondistended, soft and nontender. No organomegaly or masses felt. Normal bowel sounds heard. g tube in place. Central nervous system: Alert and oriented. No focal neurological deficits. Extremities: Symmetric 4 x 5 power. No clubbing, edema, or cyanosis. Skin: No rashes, lesions or ulcers   Data Reviewed:   I have personally reviewed following labs and imaging studies:  Labs: Basic Metabolic Panel:  Recent Labs Lab 04/02/16 0417 04/05/16 0450  NA 139 136  K 3.7 3.6  CL 92* 91*  CO2 37* 36*  GLUCOSE 114* 110*  BUN 40* 32*  CREATININE 0.74 0.80  CALCIUM 8.6* 8.5*   GFR Estimated Creatinine Clearance: 69.4 mL/min (by C-G formula based on Cr of 0.8). Liver Function Tests: No results for input(s): AST, ALT, ALKPHOS, BILITOT, PROT, ALBUMIN in the last 168 hours. No results for input(s): AMMONIA in the last 168 hours. CBC:  Recent Labs Lab 04/05/16 0450  WBC 10.0  HGB 9.3*  HCT 31.3*  MCV 87.4  PLT 292   CBG:  Recent  Labs Lab 04/07/16 2016 04/07/16 2350 04/08/16 0402 04/08/16 0755 04/08/16 1146  GLUCAP 126* 101* 108* 89 138*   Urine analysis:    Component Value Date/Time   COLORURINE YELLOW 02/25/2016 0124   APPEARANCEUR CLEAR 02/25/2016 0124   LABSPEC 1.046* 02/25/2016 0124   PHURINE 5.5 02/25/2016 0124   GLUCOSEU NEGATIVE 02/25/2016 0124   HGBUR NEGATIVE 02/25/2016 0124   BILIRUBINUR NEGATIVE 02/25/2016 0124   KETONESUR NEGATIVE 02/25/2016 0124   PROTEINUR 100* 02/25/2016 0124   NITRITE NEGATIVE 02/25/2016 0124   LEUKOCYTESUR NEGATIVE 02/25/2016 0124   Microbiology No results found for this or any previous visit (from the past 240 hour(s)).  Radiology: No results found.  Medications:   . bacitracin   Topical TID  . chlorhexidine  15 mL Mouth Rinse BID  . docusate  100 mg Oral BID  . feeding supplement (GLUCERNA SHAKE)  237 mL Oral TID BM  . furosemide  40 mg Per Tube BID  . heparin subcutaneous  5,000 Units Subcutaneous 3 times per  day  . levothyroxine  100 mcg Oral QAC breakfast  . pantoprazole sodium  40 mg Per Tube BID  . polyethylene glycol  17 g Per Tube Daily   Continuous Infusions: . sodium chloride 10 mL/hr at 04/02/16 2158    Time spent: 25 minutes.      LOS: 43 days   Makalia Bare  [facility] Pager [phone] *Please refer to [email or web address], password [email or web address] to get updated schedule on who will round on this patient, as hospitalists switch teams weekly. If 7PM-7AM, please contact night-coverage at [email or web address], password [email or web address] for any overnight needs.  04/08/2016, 5:36 PM

## 2016-04-08 NOTE — Progress Notes (Signed)
Rehab Admissions Coordinator Note:  Patient was screened by Trish MageLogue, Abou Sterkel M for appropriateness for an Inpatient Acute Rehab Consult.  An inpatient rehab consult was done earlier this month.  An admissions coordinator will follow up on Monday to re-evaluated patient for potential acute inpatient rehab admission.  Trish MageLogue, Louann Hopson M 04/08/2016, 6:25 PM  I can be reached at [phone].

## 2016-04-08 NOTE — Progress Notes (Signed)
Physical Therapy Treatment Patient Details Name: Kendra Osborne MRN: [id number] DOB: 07/04/1946 Today's Date: 04/08/2016    History of Present Illness pt presents with Large thyroid mass/goiter and Respiratory Difficulties.  pt has underwent Total Thyroidectomy with goiter removal and has remained intubated with one failed extubation on 3/24.  [name] and only known medical hx is HTN and Goiter.      PT Comments    Continuing work on activity tolerance and transfers, with noted improvements in levels of assist and sitting tolerance;   I have also noted that the current plan is for Kendra Osborne to discharge home (possibly tomorrow) with equipment, and as she cannot get HHPT, they will potentially arrange for Outpt PT;  Given the recent progress she has made, and that she will get more consistent therapies at CIR than at home, I believe it is worth having Rehab take a second look at Kendra Osborne (Noted they had understandable qualms about her tolerating 3 hours of therapies, and they signed off earlier this month); She is showing better ability to participate, and I believe with consistent therapies at CIR it will boost her functional mobility and ADLs and make getting home better, and safer after a rehab stay;   Will place a CIR screen; I text paged Dr. Blake DivineAkula with the notion of [facility].  Used [employer] interpre[employer]; number [phone] this session   Follow Up Recommendations  CIR     Equipment Recommendations  Wheelchair (measurements PT);Wheelchair cushion (measurements PT)    Recommendations for Other Services Rehab consult     Precautions / Restrictions Precautions Precautions: Fall Precaution Comments: trach collar, peg    Mobility  Bed Mobility Overal bed mobility: Needs Assistance Bed Mobility: Supine to Sit     Supine to sit: Min assist;Min guard     General bed mobility comments: min assist to get fully to sit and square off  hips at EOB  Transfers Overall transfer level: Needs assistance   Transfers: Sit to/from Stand;Stand Pivot Transfers Sit to Stand: Min assist;+2 safety/equipment Stand pivot transfers: Min assist;+2 safety/equipment       General transfer comment: Mostly tactile cueing for hand placement and technique; good rise and pivot  Ambulation/Gait                 Stairs            Wheelchair Mobility    Modified Rankin (Stroke Patients Only)       Balance     Sitting balance-Leahy Scale: Fair                              Cognition Arousal/Alertness: Awake/alert Behavior During Therapy: WFL for tasks assessed/performed Overall Cognitive Status: Difficult to assess     Current Attention Level: Sustained     Safety/Judgement: Decreased awareness of safety;Decreased awareness of deficits Awareness: Intellectual   General Comments: Looks like Kendra Osborne could    Exercises General Exercises - Lower Extremity Long Arc Quad: AROM;Both;10 reps (alternating) Hip Flexion/Marching: AROM;Both;10 reps    General Comments General comments (skin integrity, edema, etc.): Pt requesting a massage of her lower back; took a short amount fo time for brief massage once sitting in chair, and applied heat to low back; Used [employer] interpre[employer]; number [phone]      Pertinent Vitals/Pain Pain Assessment: Faces Faces Pain Scale: Hurts little more Pain Location: indicates her  G tube is bothering her; requests a massage of her low back as well Pain Descriptors / Indicators: Aching;Grimacing Pain Intervention(s): Limited activity within patient's tolerance;Monitored during session    Home Living                      Prior Function            PT Goals (current goals can now be found in the care plan section) Acute Rehab PT Goals Patient Stated Goal: get stronger, and she very much wants the G tube out PT Goal Formulation: Patient unable to participate in  goal setting Time For Goal Achievement: 04/11/16 Potential to Achieve Goals: Good Progress towards PT goals: Progressing toward goals    Frequency  Min 3X/week    PT Plan Current plan remains appropriate    Co-evaluation             End of Session Equipment Utilized During Treatment: Oxygen;Gait belt Activity Tolerance: Patient tolerated treatment well Patient left: in chair;with call bell/phone within reach;with chair alarm set     Time: 1610-9604 PT Time Calculation (min) (ACUTE ONLY): 34 min  Charges:  $Therapeutic Activity: 23-37 mins                    G Codes:      Olen Pel 04/08/2016, 1:39 PM  Van Clines, [place]  Acute Rehabilitation Services Pager [phone] Office [phone]

## 2016-04-08 NOTE — Progress Notes (Addendum)
Pt desat to 83% on 28%, 5L/min via trach collar, her O2 sat fluctuates between 83-95%. Pt is asleep and does not appear to be in any distress. Trach care provided and trach suctioned. Respiratory consulted. Respiratory increase O2 to 30%, 10L/min via trach collar. Pt is sating at 92% now. Will cont to monitor.

## 2016-04-08 NOTE — Progress Notes (Addendum)
CM called and spoke with RN, Maralyn SagoSarah, who is caring for the patient.  CM asked that a pulmonary saturation note be entered and CM paged Dr. Blake DivineAkula for Home oxygen order. CM will continue to follow for discharge needs.

## 2016-04-08 NOTE — Progress Notes (Signed)
SATURATION QUALIFICATIONS: (This note is used to comply with regulatory documentation for home oxygen)  Patient Saturations on Room Air at Rest = 87%  Patient Saturations on Room Air while Ambulating = N/A   Please briefly explain why patient needs home oxygen: Pt has new trach. Pt O2 sats on room air dropped to 87%

## 2016-04-09 ENCOUNTER — Inpatient Hospital Stay (HOSPITAL_COMMUNITY): Payer: Medicaid Other

## 2016-04-09 DIAGNOSIS — R0902 Hypoxemia: Secondary | ICD-10-CM

## 2016-04-09 LAB — GLUCOSE, CAPILLARY
GLUCOSE-CAPILLARY: 92 mg/dL (ref 65–99)
GLUCOSE-CAPILLARY: 98 mg/dL (ref 65–99)
GLUCOSE-CAPILLARY: 145 mg/dL — AB (ref 65–99)
Glucose-Capillary: 136 mg/dL — ABNORMAL HIGH (ref 65–99)

## 2016-04-09 MED ORDER — POLYETHYLENE GLYCOL 3350 17 G PO PACK: 17.0000 g | PACK | Freq: Every day | ORAL | Status: DC

## 2016-04-09 MED ORDER — ALBUTEROL SULFATE (2.5 MG/3ML) 0.083% IN NEBU: 2.5000 mg | INHALATION_SOLUTION | RESPIRATORY_TRACT | Status: DC | PRN

## 2016-04-09 MED ORDER — RESOURCE THICKENUP CLEAR PO POWD: ORAL | Status: AC

## 2016-04-09 MED ORDER — PANTOPRAZOLE SODIUM 40 MG PO PACK: 40.0000 mg | PACK | Freq: Every day | ORAL | Status: DC

## 2016-04-09 MED ORDER — BACITRACIN ZINC 500 UNIT/GM EX OINT: TOPICAL_OINTMENT | Freq: Three times a day (TID) | CUTANEOUS | Status: AC

## 2016-04-09 MED ORDER — LEVOTHYROXINE SODIUM 100 MCG PO TABS: 100.0000 ug | ORAL_TABLET | Freq: Every day | ORAL | Status: DC

## 2016-04-09 MED ORDER — GLUCERNA SHAKE PO LIQD: 237.0000 mL | Freq: Three times a day (TID) | ORAL | Status: AC

## 2016-04-09 MED ORDER — FUROSEMIDE 40 MG PO TABS: 40.0000 mg | ORAL_TABLET | Freq: Two times a day (BID) | ORAL | Status: DC

## 2016-04-09 MED ORDER — DOCUSATE SODIUM 50 MG/5ML PO LIQD: 100.0000 mg | Freq: Two times a day (BID) | ORAL | Status: DC

## 2016-04-09 NOTE — Progress Notes (Signed)
Pt.was discharged to home via PTAR in fair condition.

## 2016-04-09 NOTE — Progress Notes (Signed)
UR COMPLETED  

## 2016-04-09 NOTE — Progress Notes (Signed)
    Referring Physician(s): Dr Kathlen ModyVijaya Akula  Supervising Physician: Gilmer MorWagner, Jaime  Patient Status: In-pt  Chief Complaint:  Request to evaluate G tube abd site per MD Gastric tube placed 03/15/2016 resp failure; trach Need for nutrition  Subjective:  Request removal per pt and family MD wants evaluation G tube in place; intact No problems   Allergies: Review of patient's allergies indicates not on file.  Medications: Prior to Admission medications   Medication Sig Start Date End Date Taking? Authorizing Provider  acetaminophen (TYLENOL) 650 MG CR tablet Take 650 mg by mouth 2 (two) times daily as needed for pain.   Yes Historical Provider, MD  aspirin EC 81 MG tablet Take 81 mg by mouth daily as needed (for high blood pressure).   Yes Historical Provider, MD  naproxen sodium (ANAPROX) 220 MG tablet Take 220 mg by mouth daily as needed (for knee pain).   Yes Historical Provider, MD  PRESCRIPTION MEDICATION Take 1 tablet by mouth daily as needed (for high blood pressure).   Yes Historical Provider, MD     Vital Signs: BP 169/72 mmHg  Pulse 68  Temp(Src) 97.9 F (36.6 C) (Oral)  Resp 14  Ht 5' (1.524 m)  Wt 210 lb 9.6 oz (95.528 kg)  BMI 41.13 kg/m2  SpO2 99%  Physical Exam  Abdominal: Soft. Bowel sounds are normal. There is no tenderness.  Skin: Skin is warm and dry.  Site of gastric tube clean and dry No sign of infection No leaking No redness   Nursing note and vitals reviewed. afeb  Imaging: No results found.  Labs:  CBC:  Recent Labs  03/27/16 1450 03/29/16 0343 04/01/16 0552 04/05/16 0450  WBC 12.0* 13.6* 12.9* 10.0  HGB 9.1* 9.2* 9.7* 9.3*  HCT 29.8* 30.7* 32.8* 31.3*  PLT 268 290 335 292    COAGS:  Recent Labs  03/15/16 0421  INR 1.06    BMP:  Recent Labs  03/29/16 0343 04/01/16 0552 04/02/16 0417 04/05/16 0450  NA 135 135 139 136  K 3.5 3.3* 3.7 3.6  CL 86* 87* 92* 91*  CO2 38* 36* 37* 36*  GLUCOSE 138* 154* 114*  110*  BUN 29* 41* 40* 32*  CALCIUM 8.3* 8.5* 8.6* 8.5*  CREATININE 0.75 0.87 0.74 0.80  GFRNONAA >60 >60 >60 >60  GFRAA >60 >60 >60 >60    LIVER FUNCTION TESTS:  Recent Labs  03/24/16 0943 03/25/16 0856 03/29/16 0343 04/01/16 0552  BILITOT 0.4 0.7 0.5 0.5  AST 15 32 30 33  ALT 16 23 22 22   ALKPHOS 75 83 60 65  PROT 5.5* 7.7 6.9 7.3  ALBUMIN 1.7* 2.3* 2.0* 2.1*    Assessment and Plan:  Percutaneous gastric tube placed 03/15/16 Rec: do not remove for at least 6 weeks post placement Removal too soon places pt at high infection/bleeding risk Need tract to heal and seal well  Can be removed as OP MD can order for IR to remove as early as mid May 2017  RN to explain to pts son---speaks [name] language  Electronically Signed: Yanai Hobson A 04/09/2016, 12:37 PM   I spent a total of 15 Minutes at the the patient's bedside AND on the patient's hospital floor or unit, greater than 50% of which was counseling/coordinating care for G tube removal consideration

## 2016-04-09 NOTE — Care Management Note (Addendum)
Case Management Note  Patient Details  Name: Kendra Osborne MRN: [id number] Date of Birth: 07/09/1946  Subjective/Objective:                    Action/Plan: Plan to d/c to home with home health services (RN,RT). Pt is to be transported to home per PT[facility]. CM called and scheduled [facility] to transport pt to home. CM made son Naveed aware. F/U appointment with Dr.Gore(ENT) scheduled for 04/19/16 @ 9:00am, refer to AVS.  Expected Discharge Date:  04/09/2016              Expected Discharge Plan:  Home w Home Health Services  In-House Referral:     Discharge planning Services  CM Consult  Post Acute Care Choice:  Durable Medical Equipment, Home Health Choice offered to:  Patient, Adult Children  DME Arranged:  Trach supplies, Suction, 3-N-1, Hospital bed, Wheelchair manual, oxygen. DME Agency:  [facility]  HH Arranged:  RN, Respirator Therapy HH Agency:  [facility]  Status of Service:  completed  Medicare Important Message Given:    Date Medicare IM Given:    Medicare IM give by:    Date Additional Medicare IM Given:    Additional Medicare Important Message give by:     If discussed at Long Length of Stay Meetings, dates discussed:    Additional Comments:  Epifanio LeschesCole, Nabiha Planck Hudson, [place]RN,BSN,CM [phone] 04/09/2016, 9:56 AM

## 2016-04-09 NOTE — Progress Notes (Signed)
Inpatient Rehabilitation  Asked by PT to reassess for CIR.  Please note that I reviewed case and patient's candidacy on 04/05/16.  At that time and currently patient does not appear to be at a level appropriate for IP Rehab with inability to demonstrate ability to tolerate 3 hours of therapy a day.    Charlane FerrettiMelissa Lekendrick Alpern, M.A., CCC/SLP Admission Coordinator  [facility]  Cell [phone]

## 2016-04-09 NOTE — Progress Notes (Signed)
Pt's son called, given discharge instructions, prescriptions, and care notes. Family verbalized understanding AEB no further questions or concerns at this time. IV was discontinued, no redness, pain, or swelling noted at this time. Pt to be transferred home by PTAR.

## 2016-04-09 NOTE — Progress Notes (Signed)
PULMONARY / CRITICAL CARE MEDICINE    Name: Kendra Osborne MRN: [id number] DOB: 01/07/1946    ADMISSION DATE:  02/24/2016  PRIMARY SERVICE: PCCM  CHIEF COMPLAINT:  Dyspnea  BRIEF PATIENT DESCRIPTION: 70 y/o woman recently moved from [name] who presented 3/17 with respiratory distress.& known goiter that has been present for years, as well as possibly hypertension. It is not clear if the goiter was ever worked up or if she ever was on regular therapy for her hypertension. Her son (who speaks [place]) report that she been having a cough for a few days as well as some fevers and chills. Had aspiration event after trach change and was placed back on cuffed trach with vent,  out to SDU.    SUBJECTIVE:   On 28% ATC, tolerating PMV -phonating well with  #6 XLT uncuffed trach.   Out of bed to chair and able to suction herself. Gesturing that she wants her PEG out.    VITAL SIGNS: Temp:  [97.9 F (36.6 C)-98.3 F (36.8 C)] 97.9 F (36.6 C) (05/01 0545) Pulse Rate:  [64-83] 68 (05/01 0545) Resp:  [14-20] 14 (05/01 0545) BP: (140-169)/(70-72) 169/72 mmHg (05/01 0545) SpO2:  [94 %-99 %] 99 % (05/01 0545) FiO2 (%):  [28 %-40 %] 35 % (05/01 0442) Weight:  [95.528 kg (210 lb 9.6 oz)] 95.528 kg (210 lb 9.6 oz) (05/01 0445)   HEMODYNAMICS:     VENTILATOR SETTINGS: Vent Mode:  [-]  FiO2 (%):  [28 %-40 %] 35 %  INTAKE / OUTPUT: Intake/Output      04/30 0701 - 05/01 0700 05/01 0701 - 05/02 0700   P.O.  360   I.V. (mL/kg) 120 (1.3)    Total Intake(mL/kg) 120 (1.3) 360 (3.8)   Urine (mL/kg/hr)     Emesis/NG output     Stool     Total Output       Net +120 +360        Urine Occurrence 1 x    Stool Occurrence 1 x     PHYSICAL EXAMINATION: General:  Obese female in NAD on ATC, eating with assistance Integument:  Warm & dry. No rash on exposed skin. HEENT:  #6 XLT uncuffed trach in place, poor dentition. Speaking well with PMV Cardiovascular:  s1s2 RRR Pulmonary:  Non-labored, scattered  rhonchi, decreased in bases Abdomen: Soft, Normal bowel sounds. Nondistended. PEG side cdi  Neurological: Spontaneously moving all 4 extremities. Eyes open. Appears grossly nonfocal. Follows simple gestures ie. take a big breath with prompting.    LABS:  CBC  Recent Labs Lab 04/05/16 0450  WBC 10.0  HGB 9.3*  HCT 31.3*  PLT 292   BMET  Recent Labs Lab 04/05/16 0450  NA 136  K 3.6  CL 91*  CO2 36*  BUN 32*  CREATININE 0.80  GLUCOSE 110*   Electrolytes  Recent Labs Lab 04/05/16 0450  CALCIUM 8.5*     Imaging No results found.    SIGNIFICANT EVENTS: 3/17 - Admit 3/21 - Thyroidectomy 3/24 - Extubation & reintubation within 2 hours due to hypoxia & secretions 4/06 -  24 hr trach collar 4/07- Trach change by ENT to 6 uncuffed proximal XLT 4/08 - Transferred back to ICU for resp distress/asp pna. Trach changed to #6 cuffed XLT at bedside.  4/12 - Trach collar 4/17 - Changed trach to 6 cuffless 4/24 - Tolerating 28% ATC  STUDIES:  CT-A of Chest >> mild mediastinal enlargement CT of the Neck with contrast >> multinodular  goiter, meningioma Port CXR 3/22 >>   ETT & CVL in good position. Blunting bilateral costophrenic angles suggestive of effusions. Bilateral hilar fullness & interstitial prominence with low lung volumes.  Port CXR 3/25 >> Silhouetting of the left hemidiaphragm. Platelike atelectasis right lower lung. Endotracheal tube in good position. Ort CXR 4/8 >> incr bibasilar atelectasis 4/12 pCXR >> persistent elevation of right hemidiaphragm, bibasilar atelectasis vs infltrate  LINES / TUBES: OETT 7.5 3/17 - 3/24; 7.0 3/24>>>3/31 Trach (ENT) 3/31 >> L Subclavian CVL 3/21 >> L Port 3/21>>> OGT 3/21 - 3/24; 3/24 >> Foley 3/18 >> L Radial Art Line 3/21 - 3/23 R & L Neck Drains 3/21 - ???  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 S/P total thyroidectomy 3/21. Elevated Right Hemidiaphragm  Dyspahgia  Tracheostomy Status - #6 cuffless trach since 4/17  Plan: RN/RT to teach family re: trach care. Patient will not be able to be placed in a facility.   We discussed with ENT-who recommended not to downsize since she has extra long, status post total thyroidectomy for large goiter and tracheal compression  - PMV valve as tolerated - repeat SLP eval pending flor swallowing, she would like PEG out. - ATC 28%, wean to room air for sats > 90%  - Push pulmonary hygiene - mobilize, deep breathing etc - Will need ENT follow-up as outpatient  Joneen Roach, AGACNP-BC [facility] Pager [phone] or [phone]  04/09/2016 12:15 PM  Attending Note:  70 year old female, s/p trach after a thyroid procedure and failure to wean of the vent.  On exam, using PMV and on TC with no need for vent.  No decannulation for now.  I reviewed CXR myself, trach in good position.    Trach status:  - No decannulation.  - Will need f/u with ENT to determine when it is acceptable to decannulate.  Chronic respiratory failure:  - TC.  - From a respiratory mechanic standpoint, can decannulate but airway protection and the internal anatomy will remain an issue, would recommend ENT to evaluate for that.  Hypoxemia:  - Titrate O2 for sat of 88-92%.  - Qualify for home O2.  Dysphagia:  - Continue TF per PEG.  - Would not remove PEG given low PO intake.  Patient seen and examined, agree with above note.  I dictated the care and orders written for this patient under my direction.  Alyson Reedy,  MD [phone]

## 2016-04-09 NOTE — Procedures (Signed)
Objective Swallowing Evaluation: Type of Study: MBS-Modified Barium Swallow Study  Patient Details  Name: Kendra Osborne MRN: [id number] Date of Birth: 08/19/1946  Today's Date: 04/09/2016 Time: SLP Start Time (ACUTE ONLY): 1347-SLP Stop Time (ACUTE ONLY): 1410 SLP Time Calculation (min) (ACUTE ONLY): 23 min  Past Medical History:  Past Medical History  Diagnosis Date  . Hypertension   . Chronic diastolic CHF (congestive heart failure) [facility](HCC)    Past Surgical History:  Past Surgical History  Procedure Laterality Date  . Thyroidectomy N/A 02/28/2016    Procedure:  TOTAL THYROIDECTOMY WITH NIMS MONITOR;  Surgeon: Melvenia BeamMitchell Gore, MD;  [facility];  Service: ENT;  Laterality: N/A;  . Tracheostomy tube placement N/A 03/09/2016    Procedure: TRACHEOSTOMY;  Surgeon: Melvenia BeamMitchell Gore, MD;  [facility];  Service: ENT;  Laterality: N/A;   HPI: [age] y.o. woman who recently moved from [name] (no known PMHx) who presented to ED with respiratory distress and lower extremity edema. Pt was intubated 3/17 and failed to wean, therefore trach placed. CXR 3/31 stable bibasilar atelectasis.   Subjective: pt alert, with PMSV in place  Assessment / Plan / Recommendation  CHL IP CLINICAL IMPRESSIONS 04/09/2016  Therapy Diagnosis --  Clinical Impression Pt swallow function is relatively unchanged from prior MBS. There is a slightly delayed swallow and incomplete airway closure leading to silent aspiration of nectar and thin liquids if the bolus is large. With small sips of nectar aspiration is prevented, but thin liquids are aspirated regardless of bolus size. Provided visual cues for a chin tuck which pt followed, but was not helpful given ongoing glottal incompetence. Aspiration was initally silent with significant coughing after aspirate descended trachea, though pt is also observed to cough frequently at baseline. Pt will need to continue nectar thick liquids at d/c though she may eat foods of choice. Attempted  to call son to inform him of these findings, but he did not respond. Pt may benefit from ENT consult given concern for reduced laryngeal adduction.   Impact on safety and function Moderate aspiration risk      CHL IP TREATMENT RECOMMENDATION 04/09/2016  Treatment Recommendations Therapy as outlined in treatment plan below     Prognosis 04/09/2016  Prognosis for Safe Diet Advancement Good  Barriers to Reach Goals --  Barriers/Prognosis Comment --    CHL IP DIET RECOMMENDATION 04/09/2016  SLP Diet Recommendations Dysphagia 3 (Mech soft) solids;Nectar thick liquid  Liquid Administration via Cup  Medication Administration Whole meds with puree  Compensations Slow rate;Small sips/bites  Postural Changes --      CHL IP OTHER RECOMMENDATIONS 04/09/2016  Recommended Consults Consider ENT evaluation  Oral Care Recommendations Oral care BID  Other Recommendations Order thickener from pharmacy      CHL IP FOLLOW UP RECOMMENDATIONS 04/09/2016  Follow up Recommendations Home health SLP;24 hour supervision/assistance      CHL IP FREQUENCY AND DURATION 04/09/2016  Speech Therapy Frequency (ACUTE ONLY) min 2x/week  Treatment Duration 2 weeks           CHL IP ORAL PHASE 04/09/2016  Oral Phase WFL  Oral - Pudding Teaspoon --  Oral - Pudding Cup --  Oral - Honey Teaspoon --  Oral - Honey Cup --  Oral - Nectar Teaspoon --  Oral - Nectar Cup --  Oral - Nectar Straw --  Oral - Thin Teaspoon --  Oral - Thin Cup --  Oral - Thin Straw --  Oral - Puree --  Oral - Mech  Soft --  Oral - Regular --  Oral - Multi-Consistency --  Oral - Pill --  Oral Phase - Comment --    CHL IP PHARYNGEAL PHASE 04/09/2016  Pharyngeal Phase Impaired  Pharyngeal- Pudding Teaspoon --  Pharyngeal --  Pharyngeal- Pudding Cup --  Pharyngeal --  Pharyngeal- Honey Teaspoon --  Pharyngeal --  Pharyngeal- Honey Cup --  Pharyngeal --  Pharyngeal- Nectar Teaspoon NT  Pharyngeal --  Pharyngeal- Nectar Cup  Penetration/Aspiration during swallow;Penetration/Aspiration before swallow;Reduced tongue base retraction;Delayed swallow initiation-vallecula;Compensatory strategies attempted (with notebox)  Pharyngeal Material does not enter airway;Material enters airway, passes BELOW cords without attempt by patient to eject out (silent aspiration)  Pharyngeal- Nectar Straw NT  Pharyngeal --  Pharyngeal- Thin Teaspoon --  Pharyngeal --  Pharyngeal- Thin Cup Delayed swallow initiation-pyriform sinuses;Moderate aspiration;Penetration/Aspiration during swallow;Penetration/Aspiration before swallow  Pharyngeal --  Pharyngeal- Thin Straw NT  Pharyngeal --  Pharyngeal- Puree NT  Pharyngeal --  Pharyngeal- Mechanical Soft --  Pharyngeal --  Pharyngeal- Regular NT  Pharyngeal --  Pharyngeal- Multi-consistency --  Pharyngeal --  Pharyngeal- Pill NT  Pharyngeal --  Pharyngeal Comment --     CHL IP CERVICAL ESOPHAGEAL PHASE 03/27/2016  Cervical Esophageal Phase (No Data)  Pudding Teaspoon --  Pudding Cup --  Honey Teaspoon --  Honey Cup --  Nectar Teaspoon --  Nectar Cup --  Nectar Straw --  Thin Teaspoon --  Thin Cup --  Thin Straw --  Puree --  Mechanical Soft --  Regular --  Multi-consistency --  Pill --  Cervical Esophageal Comment --    No flowsheet data found. Harlon Ditty, MA CCC-SLP [phone]  Claudine Mouton 04/09/2016, 2:29 PM

## 2016-04-09 NOTE — Progress Notes (Signed)
Pt desat to 80% on 28% 5L/min O2 via trach collar. Assess pt, provided trach care and suctioned pt. Pt's sat continues fluctuates between 80-92%. Respiratory consulted. Respiratory assessed pt and suctioned pt. Increased O2 to 40% 10L/min via trach collar. Pt's sat fluctuates between 90-98% per RT. Pt is sleeping in bed and does not appear to be in any distress. Will cont to monitor

## 2016-04-10 NOTE — Progress Notes (Signed)
Nurse  D[facility] from [facility] called and wanted to know patient diet. Explain dysphasia 3 with nectar thick liquids according to last MBS test she had.  The nurse repeated puree diet twice. She also stated patient haven't started on medication yet and patient leg were swollen, and the son was getting medications today

## 2016-04-16 ENCOUNTER — Ambulatory Visit: Payer: Self-pay | Attending: Family Medicine | Admitting: Family Medicine

## 2016-04-16 ENCOUNTER — Encounter: Payer: Self-pay | Admitting: Family Medicine

## 2016-04-16 VITALS — BP 132/96 | HR 83 | Temp 98.3°F | Resp 16 | Ht 60.0 in

## 2016-04-16 DIAGNOSIS — I10 Essential (primary) hypertension: Secondary | ICD-10-CM | POA: Insufficient documentation

## 2016-04-16 DIAGNOSIS — E890 Postprocedural hypothyroidism: Secondary | ICD-10-CM | POA: Insufficient documentation

## 2016-04-16 DIAGNOSIS — R05 Cough: Secondary | ICD-10-CM | POA: Insufficient documentation

## 2016-04-16 DIAGNOSIS — Z792 Long term (current) use of antibiotics: Secondary | ICD-10-CM | POA: Insufficient documentation

## 2016-04-16 DIAGNOSIS — Z7982 Long term (current) use of aspirin: Secondary | ICD-10-CM | POA: Insufficient documentation

## 2016-04-16 DIAGNOSIS — J9622 Acute and chronic respiratory failure with hypercapnia: Secondary | ICD-10-CM | POA: Insufficient documentation

## 2016-04-16 DIAGNOSIS — Z79899 Other long term (current) drug therapy: Secondary | ICD-10-CM | POA: Insufficient documentation

## 2016-04-16 DIAGNOSIS — Z930 Tracheostomy status: Secondary | ICD-10-CM | POA: Insufficient documentation

## 2016-04-16 DIAGNOSIS — R059 Cough, unspecified: Secondary | ICD-10-CM

## 2016-04-16 DIAGNOSIS — M545 Low back pain: Secondary | ICD-10-CM | POA: Insufficient documentation

## 2016-04-16 DIAGNOSIS — Z430 Encounter for attention to tracheostomy: Secondary | ICD-10-CM

## 2016-04-16 DIAGNOSIS — M2550 Pain in unspecified joint: Secondary | ICD-10-CM

## 2016-04-16 MED ORDER — HYDROCODONE-HOMATROPINE 5-1.5 MG/5ML PO SYRP: 5.0000 mL | ORAL_SOLUTION | Freq: Three times a day (TID) | ORAL | Status: AC | PRN

## 2016-04-16 NOTE — Progress Notes (Signed)
Patient's here for HFU respiratory failure with Trach.  Patient's son states that his mother's BP run about 159/96 [facility].

## 2016-04-16 NOTE — Discharge Summary (Signed)
Physician Discharge Summary  Royann Wildasin ZOX:[id number] DOB: 1946/09/13 DOA: 02/24/2016  PCP: Jaclyn Shaggy, MD  Admit date: 02/24/2016 Discharge date: 04/09/2016  Time spent: 30 minutes  Recommendations for Outpatient Follow-up:  1. Follow up with PCP in one week.  2. Follow up with ENT as recommended.  3. Follow up with SLP as outpatient.    Discharge Diagnoses:  Active Problems:   Respiratory failure (HCC)   Acute respiratory failure with hypoxia (HCC)   Acute encephalopathy   Acute pulmonary edema (HCC)   Acute respiratory failure (HCC)   Acute respiratory failure with hypoxemia (HCC)   Encounter for feeding tube placement   Acute respiratory failure with hypercapnia (HCC)   Atelectasis   Goiter   Tracheostomy in place [facility])   Essential hypertension   Chronic diastolic CHF (congestive heart failure) (HCC)   Hypokalemia   Hypernatremia   Postoperative hypothyroidism   Aspiration pneumonia of right lower lobe due to vomit (HCC)   Respiratory acidosis   Dysphagia   Acute renal failure (HCC)   Acute on chronic respiratory failure with hypercapnia (HCC)   CAP (community acquired pneumonia)   Abdominal pain   Tracheostomy care [facility])   Discharge Condition: improved    Filed Weights   04/07/16 0438 04/08/16 0403 04/09/16 0445  Weight: 95.6 kg (210 lb 12.2 oz) 94.892 kg (209 lb 3.2 oz) 95.528 kg (210 lb 9.6 oz)    History of present illness:  Franziska Podgurski is an 70 y.o. female with a PMH of goiter and hypertension who recently moved here from [place] presented to the hospital on 02/24/16 with respiratory distress in the setting of a few day history of cough, fever and chills. She was intubated on admission. 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 She was extubated and reintubated within 2 hours on 03/02/16 secondary to hypoxia and increased  secretions. She underwent placement of a tracheostomy tube 03/09/16 and remained on the ventilator until 03/15/16 when she was weaned to a 24-hour trach collar. Her tracheostomy was changed by ENT 03/16/16. She developed worsening respiratory distress and aspiration pneumonia on 03/17/16 and required ongoing ventilator support. Transition to a trach collar on 03/21/16.  Hospital Course:  Acute on Chronic hypercarbic and hypoxic respiratory failure - s/p tracheostomy  Multifactorial: pulmonary edema, viral bronchitis/pneumonitis, extrinsic airway compression, and oversedation all contributory. Sedating medications have been minimized. She completed a course of antibiotics on 03/02/16. She continues on a trach collar and pulmonology continues to follow.  Could not downsize the trach as she had extra long s/p total thyroidectomy and trachel compression.   Aspiration event  Has been tx for aspiration pneumonitis - SLP following dysphagia - Recommended dysphagia 3 diet. g tube placed this admission by IR, pt wants the G TUBE out, . Tried to discuss with the patient , the G tube can be taken out when her swallowing improves. the G TUBE needs to be in atleast 6 to 8 weeks .   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  Altered mental status Back to baseline. B12 + folate normal - RPR and HIV negative - ammonia not signif elevated - ?ICU psychosis / sundowning - seroquel low dose QHS only for now.  Resolved. She is alert and oriented.   HTN  BP reasonably well controlled.  Chronic grade 1 diastolic CHF Continue to monitor fluid volume status. Continue  Lasix 40 mg twice a day.  Acute Renal Failure (NO CKD) Resolved.  Dyspahgia PEG tube placed 03/15/16. Has been managed with tube feeding via PEG. Speech therapy to reevaluated and recommended to continue with thickened liquids. .  Postoperative Hypothyroidism S/P Total Thyroidectomy - previously normal  TSH. Continue Synthroid. Recheck TSH in 6-8 weeks as adjustment of dose likely to be required .  Obesity - Body mass index is 42.02 kg/(m^2).  Abdominal discomfort, bloating : increased protonix BID and added mylanta.    Procedures:  02/26/16 TTE EF 60%-65% - grade 1 diastolic dysfunction 02/28/16 Thyroidectomy 3/24 17 - Extubation & reintubation within 2 hours due to hypoxia & secretions 03/15/16 24 hr trach collar 03/15/16 IR GASTROSTOMY TUBE  03/16/16 tracheostomy tube change - old 6 cuffed proximal XLT shiley was removed with the old silk sutures, and a new 6 cuffless proximal XLT shiley was placed  03/17/16 transferred back to icu for resp distress/asp pna. Trach changed to #6 cuffed XLT at bedside. 03/21/16 trach collar 03/26/16 #6 cuffed XLT change to #6 cuffless XLT  Consultations:  ENT  Pulmonology  Interventional Radiology  Discharge Exam: Filed Vitals:   04/09/16 1541 04/09/16 1630  BP: 164/77   Pulse: 72 73  Temp: 97.8 F (36.6 C)   Resp:  18    General: alert comfortable.  Cardiovascular: s1s2 Respiratory: ctab.  Discharge Instructions   Discharge Instructions    Diet - low sodium heart healthy    Complete by:  As directed      Discharge instructions    Complete by:  As directed   Please follow up with PCP in one week.  Please follow up with ENT in 1 to 2 weeks.  Please followup wth home health PT, ot, SLP.  Please follow up with Intervention Radiology in 4 to 6 weeks.          Discharge Medication List as of 04/09/2016  7:51 PM    START taking these medications   Details  albuterol (PROVENTIL) (2.5 MG/3ML) 0.083% nebulizer solution Take 3 mLs (2.5 mg total) by nebulization every 2 (two) hours as needed for wheezing., Starting 04/09/2016, Until Discontinued, Normal    bacitracin ointment Apply topically 3 (three) times daily., Starting 04/09/2016, Until Discontinued, Normal    docusate (COLACE) 50 MG/5ML liquid Take 10 mLs (100 mg total) by mouth 2  (two) times daily., Starting 04/09/2016, Until Discontinued, Print    feeding supplement, GLUCERNA SHAKE, (GLUCERNA SHAKE) LIQD Take 237 mLs by mouth 3 (three) times daily between meals., Starting 04/09/2016, Until Discontinued, No Print    furosemide (LASIX) 40 MG tablet Place 1 tablet (40 mg total) into feeding tube 2 (two) times daily., Starting 04/09/2016, Until Discontinued, Print    levothyroxine (SYNTHROID, LEVOTHROID) 100 MCG tablet Take 1 tablet (100 mcg total) by mouth daily before breakfast., Starting 04/09/2016, Until Discontinued, Print    Maltodextrin-Xanthan Gum (RESOURCE THICKENUP CLEAR) POWD As needed, No Print    pantoprazole sodium (PROTONIX) 40 mg/20 mL PACK Place 20 mLs (40 mg total) into feeding tube daily., Starting 04/09/2016, Until Discontinued, Print    polyethylene glycol (MIRALAX / GLYCOLAX) packet Place 17 g into feeding tube daily., Starting 04/09/2016, Until Discontinued, Print      CONTINUE these medications which have NOT CHANGED   Details  acetaminophen (TYLENOL) 650 MG CR tablet Take 650 mg by mouth 2 (two) times daily as needed for pain., Until Discontinued, Historical Med    aspirin EC 81 MG tablet Take 81  mg by mouth daily as needed (for high blood pressure)., Until Discontinued, Historical Med      STOP taking these medications     naproxen sodium (ANAPROX) 220 MG tablet      PRESCRIPTION MEDICATION        Not on File Follow-up Information    Follow up with [facility] On 04/16/2016.   Why:  Hospital follow-up appointment scheduled for 04/16/16 at 2:45 pm with Dr. Venetia Night.   Contact information:   [address] [place] [address] [phone]      Follow up with [facility].   Why:  Home health RN, RT arranged   Contact information:   [address] [place] [address] [phone]       Follow up with [facility].   Why:  trach supplies and portable  suction delivered to bedside   Contact information:   [address] [place] [address] [phone]       Follow up with Melvenia Beam, MD. Go on 04/19/2016.   Specialty:  Otolaryngology   Why:    Post hospital follow-up scheduled on 04/19/2016 at 9:00 am   Contact information:   [address] [place] [address] [phone]        The results of significant diagnostics from this hospitalization (including imaging, microbiology, ancillary and laboratory) are listed below for reference.    Significant Diagnostic Studies: Dg Chest Port 1 View  03/21/2016  CLINICAL DATA:  Respiratory failure. EXAM: PORTABLE CHEST 1 VIEW COMPARISON:  03/18/2016. FINDINGS: Tracheostomy tube and right PICC line stable position. Stable mild mediastinal fullness. This possibly due from prominent great vessels. Heart size normal. Persistent low lung volumes with basilar atelectasis and/or infiltrates. Persistent elevation right hemidiaphragm. No prominent pleural effusion or pneumothorax. IMPRESSION: 1. Lines and tubes in stable position. 2. Versus persistent bibasilar atelectasis and/or infiltrates with elevation the right hemidiaphragm. Electronically Signed   By: Maisie Fus  Register   On: 03/21/2016 07:37   Portable Chest Xray  03/18/2016  CLINICAL DATA:  Respiratory failure. EXAM: PORTABLE CHEST 1 VIEW COMPARISON:  03/17/2016 FINDINGS: Slightly increased elevation of the right hemidiaphragm probably represents volume loss. Improved aeration at the left lung base. Tracheostomy tube appears to be well positioned. There is a right arm PICC line terminates in the upper right atrium region but unchanged. Heart size is stable. IMPRESSION: Elevation of the right hemidiaphragm is suggestive for volume loss. Improved aeration at the left lung base. Stable appearance of the support apparatuses. Electronically Signed   By: Richarda Overlie M.D.   On: 03/18/2016 08:22   Dg Abd Portable 1v  04/05/2016   CLINICAL DATA:  Constipation, abdominal distention and bloating. EXAM: PORTABLE ABDOMEN - 1 VIEW COMPARISON:  04/03/2016 FINDINGS: Contrast throughout the colon from recent modified barium swallow 2 days ago. No significant obstruction pattern, significant dilatation, or ileus. Colonic stool burden within normal limits for age. Degenerative changes of the spine with a mild scoliosis. No abnormal calcifications. IMPRESSION: Negative for obstruction or ileus. Electronically Signed   By: Judie Petit.  Shick M.D.   On: 04/05/2016 11:27   Dg Abd Portable 1v  03/25/2016  CLINICAL DATA:  Abdominal pain. EXAM: PORTABLE ABDOMEN - 1 VIEW COMPARISON:  Previous examinations. FINDINGS: Normal bowel gas pattern. The recently placed gastrostomy tube tip is now overlying the left mid abdomen. Mild to moderate scoliosis. IMPRESSION: No acute abnormality. Electronically Signed  By: Beckie Salts M.D.   On: 03/25/2016 13:54   Dg Swallowing Func-speech Pathology  04/09/2016  Objective Swallowing Evaluation: Type of Study: MBS-Modified Barium Swallow Study Patient Details Name: Madai Nuccio MRN: [id number] Date of Birth: 10-19-1946 Today's Date: 04/09/2016 Time: SLP Start Time (ACUTE ONLY): 1347-SLP Stop Time (ACUTE ONLY): 1410 SLP Time Calculation (min) (ACUTE ONLY): 23 min Past Medical History: Past Medical History Diagnosis Date . Hypertension  . Chronic diastolic CHF (congestive heart failure) [facility])  Past Surgical History: Past Surgical History Procedure Laterality Date . Thyroidectomy N/A 02/28/2016   Procedure:  TOTAL THYROIDECTOMY WITH NIMS MONITOR;  Surgeon: Melvenia Beam, MD;  Location: [facility] OR;  Service: ENT;  Laterality: N/A; . Tracheostomy tube placement N/A 03/09/2016   Procedure: TRACHEOSTOMY;  Surgeon: Melvenia Beam, MD;  Location[facility] OR;  Service: ENT;  Laterality: N/A; HPI: 70 y.o. woman who recently moved from [place] (no known PMHx) who presented to ED with respiratory distress and lower extremity edema. Pt was intubated 3/17 and  failed to wean, therefore trach placed. CXR 3/31 stable bibasilar atelectasis.  Subjective: pt alert, with PMSV in place Assessment / Plan / Recommendation CHL IP CLINICAL IMPRESSIONS 04/09/2016 Therapy Diagnosis -- Clinical Impression Pt swallow function is relatively unchanged from prior MBS. There is a slightly delayed swallow and incomplete airway closure leading to silent aspiration of nectar and thin liquids if the bolus is large. With small sips of nectar aspiration is prevented, but thin liquids are aspirated regardless of bolus size. Provided visual cues for a chin tuck which pt followed, but was not helpful given ongoing glottal incompetence. Aspiration was initally silent with significant coughing after aspirate descended trachea, though pt is also observed to cough frequently at baseline. Pt will need to continue nectar thick liquids at d/c though she may eat foods of choice. Attempted to call son to inform him of these findings, but he did not respond. Pt may benefit from ENT consult given concern for reduced laryngeal adduction Impact on safety and function Moderate aspiration risk   CHL IP TREATMENT RECOMMENDATION 04/09/2016 Treatment Recommendations Therapy as outlined in treatment plan below   Prognosis 04/09/2016 Prognosis for Safe Diet Advancement Good Barriers to Reach Goals -- Barriers/Prognosis Comment -- CHL IP DIET RECOMMENDATION 04/09/2016 SLP Diet Recommendations Dysphagia 3 (Mech soft) solids;Nectar thick liquid Liquid Administration via Cup Medication Administration Whole meds with puree Compensations Slow rate;Small sips/bites Postural Changes --   CHL IP OTHER RECOMMENDATIONS 04/09/2016 Recommended Consults Consider ENT evaluation Oral Care Recommendations Oral care BID Other Recommendations Order thickener from pharmacy   CHL IP FOLLOW UP RECOMMENDATIONS 04/09/2016 Follow up Recommendations Home health SLP;24 hour supervision/assistance   CHL IP FREQUENCY AND DURATION 04/09/2016 Speech Therapy  Frequency (ACUTE ONLY) min 2x/week Treatment Duration 2 weeks      CHL IP ORAL PHASE 04/09/2016 Oral Phase WFL Oral - Pudding Teaspoon -- Oral - Pudding Cup -- Oral - Honey Teaspoon -- Oral - Honey Cup -- Oral - Nectar Teaspoon -- Oral - Nectar Cup -- Oral - Nectar Straw -- Oral - Thin Teaspoon -- Oral - Thin Cup -- Oral - Thin Straw -- Oral - Puree -- Oral - Mech Soft -- Oral - Regular -- Oral - Multi-Consistency -- Oral - Pill -- Oral Phase - Comment --  CHL IP PHARYNGEAL PHASE 04/09/2016 Pharyngeal Phase Impaired Pharyngeal- Pudding Teaspoon -- Pharyngeal -- Pharyngeal- Pudding Cup -- Pharyngeal -- Pharyngeal- Honey Teaspoon -- Pharyngeal -- Pharyngeal- Honey Cup -- Pharyngeal -- Pharyngeal- Nectar Teaspoon  NT Pharyngeal -- Pharyngeal- Nectar Cup Penetration/Aspiration during swallow;Penetration/Aspiration before swallow;Reduced tongue base retraction;Delayed swallow initiation-vallecula;Compensatory strategies attempted (with notebox) Pharyngeal Material does not enter airway;Material enters airway, passes BELOW cords without attempt by patient to eject out (silent aspiration) Pharyngeal- Nectar Straw NT Pharyngeal -- Pharyngeal- Thin Teaspoon -- Pharyngeal -- Pharyngeal- Thin Cup Delayed swallow initiation-pyriform sinuses;Moderate aspiration;Penetration/Aspiration during swallow;Penetration/Aspiration before swallow Pharyngeal -- Pharyngeal- Thin Straw NT Pharyngeal -- Pharyngeal- Puree NT Pharyngeal -- Pharyngeal- Mechanical Soft -- Pharyngeal -- Pharyngeal- Regular NT Pharyngeal -- Pharyngeal- Multi-consistency -- Pharyngeal -- Pharyngeal- Pill NT Pharyngeal -- Pharyngeal Comment --  CHL IP CERVICAL ESOPHAGEAL PHASE 03/27/2016 Cervical Esophageal Phase (No Data) Pudding Teaspoon -- Pudding Cup -- Honey Teaspoon -- Honey Cup -- Nectar Teaspoon -- Nectar Cup -- Nectar Straw -- Thin Teaspoon -- Thin Cup -- Thin Straw -- Puree -- Mechanical Soft -- Regular -- Multi-consistency -- Pill -- Cervical Esophageal Comment  -- No flowsheet data found. Harlon Ditty, [place] CCC-SLP [phone] Claudine Mouton 04/09/2016, 2:31 PM              Dg Swallowing Func-speech Pathology  04/03/2016  Objective Swallowing Evaluation: Type of Study: MBS-Modified Barium Swallow Study Patient Details Name: Kelisha Dall MRN: [id number] Date of Birth: 1946/07/21 Today's Date: 04/03/2016 Time: SLP Start Time (ACUTE ONLY): 1120-SLP Stop Time (ACUTE ONLY): 1154 SLP Time Calculation (min) (ACUTE ONLY): 34 min Past Medical History: Past Medical History Diagnosis Date . Hypertension  . Chronic diastolic CHF (congestive heart failure) [facility])  Past Surgical History: Past Surgical History Procedure Laterality Date . Thyroidectomy N/A 02/28/2016   Procedure:  TOTAL THYROIDECTOMY WITH NIMS MONITOR;  Surgeon: Melvenia Beam, MD;  Location: [facility] OR;  Service: ENT;  Laterality: N/A; . Tracheostomy tube placement N/A 03/09/2016   Procedure: TRACHEOSTOMY;  Surgeon: Melvenia Beam, MD;  Location: [facility] OR;  Service: ENT;  Laterality: N/A; HPI: 70 y.o. woman who recently moved from [place] (no known PMHx) who presented to ED with respiratory distress and lower extremity edema. Pt was intubated 3/17 and failed to wean, therefore trach placed. CXR 3/31 stable bibasilar atelectasis.  Subjective: pt alert, with PMSV in place Assessment / Plan / Recommendation CHL IP CLINICAL IMPRESSIONS 04/03/2016 Therapy Diagnosis Mild pharyngeal phase dysphagia;Moderate pharyngeal phase dysphagia Clinical Impression Pt demonstrates significant improvement in pharyngeal function. She is wearing PMSV during assessment and tolerating well, orally expectorating secretions as needed. Oropharyngeal strength is WNL. Primary deficit is that with larger boluses, nectar or thin, pt cannot achieve complete airway closure before/during the swallow leading to trace silent penetration events. A chin tuck was not effective. Aspiration may have occurred but due to pts shoulder in the image it is not clear. There  was also mild backflow and laryngopharyngeal reflux with liquids given after a pill was administered. Overall pt is much improved, though she is reommended to consume nectar thick liquids and dys 3 solids with precautions of small sips and slow well paced intake. No straws, must wear PMSV. Discussed with pts son who was present. SLP will f/u for tolerance.  Impact on safety and function Mild aspiration risk;Moderate aspiration risk   CHL IP TREATMENT RECOMMENDATION 04/03/2016 Treatment Recommendations Therapy as outlined in treatment plan below   Prognosis 04/03/2016 Prognosis for Safe Diet Advancement Good Barriers to Reach Goals -- Barriers/Prognosis Comment -- CHL IP DIET RECOMMENDATION 04/03/2016 SLP Diet Recommendations Dysphagia 3 (Mech soft) solids;Nectar thick liquid Liquid Administration via Cup;No straw Medication Administration Via alternative means Compensations Slow rate;Small sips/bites Postural Changes Seated upright  at 90 degrees   CHL IP OTHER RECOMMENDATIONS 04/03/2016 Recommended Consults -- Oral Care Recommendations Oral care BID Other Recommendations --   CHL IP FOLLOW UP RECOMMENDATIONS 04/03/2016 Follow up Recommendations 24 hour supervision/assistance   CHL IP FREQUENCY AND DURATION 04/03/2016 Speech Therapy Frequency (ACUTE ONLY) min 2x/week Treatment Duration 2 weeks      CHL IP ORAL PHASE 04/03/2016 Oral Phase WFL Oral - Pudding Teaspoon -- Oral - Pudding Cup -- Oral - Honey Teaspoon -- Oral - Honey Cup -- Oral - Nectar Teaspoon -- Oral - Nectar Cup -- Oral - Nectar Straw -- Oral - Thin Teaspoon -- Oral - Thin Cup -- Oral - Thin Straw -- Oral - Puree -- Oral - Mech Soft -- Oral - Regular -- Oral - Multi-Consistency -- Oral - Pill -- Oral Phase - Comment --  CHL IP PHARYNGEAL PHASE 04/03/2016 Pharyngeal Phase Impaired Pharyngeal- Pudding Teaspoon -- Pharyngeal -- Pharyngeal- Pudding Cup -- Pharyngeal -- Pharyngeal- Honey Teaspoon -- Pharyngeal -- Pharyngeal- Honey Cup -- Pharyngeal -- Pharyngeal-  Nectar Teaspoon WFL Pharyngeal Material does not enter airway Pharyngeal- Nectar Cup Penetration/Apiration after swallow Pharyngeal Material enters airway, passes BELOW cords then ejected out Pharyngeal- Nectar Straw Penetration/Aspiration during swallow;Reduced airway/laryngeal closure Pharyngeal Material enters airway, remains ABOVE vocal cords and not ejected out Pharyngeal- Thin Teaspoon -- Pharyngeal -- Pharyngeal- Thin Cup Delayed swallow initiation-pyriform sinuses;Penetration/Aspiration during swallow;Penetration/Aspiration before swallow;Reduced airway/laryngeal closure;Compensatory strategies attempted (with notebox) Pharyngeal Material enters airway, passes BELOW cords without attempt by patient to eject out (silent aspiration) Pharyngeal- Thin Straw Delayed swallow initiation-pyriform sinuses;Penetration/Aspiration during swallow;Penetration/Aspiration before swallow;Reduced airway/laryngeal closure;Compensatory strategies attempted (with notebox) Pharyngeal Material enters airway, passes BELOW cords without attempt by patient to eject out (silent aspiration) Pharyngeal- Puree WFL Pharyngeal Material does not enter airway Pharyngeal- Mechanical Soft -- Pharyngeal -- Pharyngeal- Regular WFL Pharyngeal -- Pharyngeal- Multi-consistency -- Pharyngeal -- Pharyngeal- Pill WFL Pharyngeal -- Pharyngeal Comment --  CHL IP CERVICAL ESOPHAGEAL PHASE 03/27/2016 Cervical Esophageal Phase (No Data) Pudding Teaspoon -- Pudding Cup -- Honey Teaspoon -- Honey Cup -- Nectar Teaspoon -- Nectar Cup -- Nectar Straw -- Thin Teaspoon -- Thin Cup -- Thin Straw -- Puree -- Mechanical Soft -- Regular -- Multi-consistency -- Pill -- Cervical Esophageal Comment -- No flowsheet data found. Harlon Ditty, [place] CCC-SLP [phone] Claudine Mouton 04/03/2016, 1:53 PM              Dg Swallowing Func-speech Pathology  03/27/2016  Objective Swallowing Evaluation: Type of Study: MBS-Modified Barium Swallow Study Patient Details Name:  Karene Bracken MRN: [id number] Date of Birth: [birth year] Today's Date: 03/27/2016 Time: SLP Start Time (ACUTE ONLY): 1335-SLP Stop Time (ACUTE ONLY): 1400 SLP Time Calculation (min) (ACUTE ONLY): 25 min Past Medical History: Past Medical History Diagnosis Date . Hypertension  . Chronic diastolic CHF (congestive heart failure) [facility])  Past Surgical History: Past Surgical History Procedure Laterality Date . Thyroidectomy N/A 02/28/2016   Procedure:  TOTAL THYROIDECTOMY WITH NIMS MONITOR;  Surgeon: Melvenia Beam, MD;  Location: [facility] OR;  Service: ENT;  Laterality: N/A; . Tracheostomy tube placement N/A 03/09/2016   Procedure: TRACHEOSTOMY;  Surgeon: Melvenia Beam, MD;  Location: [facility] OR;  Service: ENT;  Laterality: N/A; HPI: 70 y.o. woman who recently moved from [place] (no known PMHx) who presented to ED with respiratory distress and lower extremity edema. Pt was intubated 3/17 and failed to wean, therefore trach placed. CXR 3/31 stable bibasilar atelectasis.  Subjective: pt alert, with PMSV in place Assessment / Plan / Recommendation  CHL IP CLINICAL IMPRESSIONS 03/27/2016 Therapy Diagnosis Mild oral phase dysphagia;Moderate pharyngeal phase dysphagia Clinical Impression Pt has a mild oral and moderate pharyngeal dysphagia with silent penetration of solids and liquids. She has oral holding followed by slow a/p transit with boluses then reaching the valleculae prior to swallow trigger. She has limited hyolaryngeal movement and incomplete airway closure that leads to silent penetration and intermittent aspiration of nectar thick liquids by tsp. Pureed solids are also silently penetrated with most but not all penetrates cleared with cued cough. SLP provided cueing to attempt chin tuck, but ROM was limited and pt was not able to maintain this positioning. Recommend that pt remain NPO at this time with additional f/u by SLP for PO readiness. Education provided via son, who assisted with translation. Impact on safety and function  Severe aspiration risk   CHL IP TREATMENT RECOMMENDATION 03/27/2016 Treatment Recommendations Therapy as outlined in treatment plan below   Prognosis 03/27/2016 Prognosis for Safe Diet Advancement Fair Barriers to Reach Goals Severity of deficits Barriers/Prognosis Comment -- CHL IP DIET RECOMMENDATION 03/27/2016 SLP Diet Recommendations NPO Liquid Administration via -- Medication Administration Via alternative means Compensations -- Postural Changes --   CHL IP OTHER RECOMMENDATIONS 03/27/2016 Recommended Consults -- Oral Care Recommendations Oral care QID Other Recommendations --   CHL IP FOLLOW UP RECOMMENDATIONS 03/27/2016 Follow up Recommendations LTACH   CHL IP FREQUENCY AND DURATION 03/27/2016 Speech Therapy Frequency (ACUTE ONLY) min 2x/week Treatment Duration 2 weeks      CHL IP ORAL PHASE 03/27/2016 Oral Phase Impaired Oral - Pudding Teaspoon -- Oral - Pudding Cup -- Oral - Honey Teaspoon -- Oral - Honey Cup -- Oral - Nectar Teaspoon Holding of bolus;Delayed oral transit Oral - Nectar Cup -- Oral - Nectar Straw -- Oral - Thin Teaspoon -- Oral - Thin Cup -- Oral - Thin Straw -- Oral - Puree Holding of bolus;Delayed oral transit Oral - Mech Soft -- Oral - Regular -- Oral - Multi-Consistency -- Oral - Pill -- Oral Phase - Comment --  CHL IP PHARYNGEAL PHASE 03/27/2016 Pharyngeal Phase Impaired Pharyngeal- Pudding Teaspoon -- Pharyngeal -- Pharyngeal- Pudding Cup -- Pharyngeal -- Pharyngeal- Honey Teaspoon -- Pharyngeal -- Pharyngeal- Honey Cup -- Pharyngeal -- Pharyngeal- Nectar Teaspoon Delayed swallow initiation-vallecula;Reduced anterior laryngeal mobility;Reduced laryngeal elevation;Reduced airway/laryngeal closure;Penetration/Aspiration during swallow Pharyngeal Material enters airway, passes BELOW cords without attempt by patient to eject out (silent aspiration) Pharyngeal- Nectar Cup -- Pharyngeal -- Pharyngeal- Nectar Straw -- Pharyngeal -- Pharyngeal- Thin Teaspoon -- Pharyngeal -- Pharyngeal- Thin Cup --  Pharyngeal -- Pharyngeal- Thin Straw -- Pharyngeal -- Pharyngeal- Puree Delayed swallow initiation-vallecula;Reduced anterior laryngeal mobility;Reduced laryngeal elevation;Reduced airway/laryngeal closure;Penetration/Aspiration during swallow Pharyngeal Material enters airway, remains ABOVE vocal cords and not ejected out Pharyngeal- Mechanical Soft -- Pharyngeal -- Pharyngeal- Regular -- Pharyngeal -- Pharyngeal- Multi-consistency -- Pharyngeal -- Pharyngeal- Pill -- Pharyngeal -- Pharyngeal Comment --  CHL IP CERVICAL ESOPHAGEAL PHASE 03/27/2016 Cervical Esophageal Phase (No Data) Pudding Teaspoon -- Pudding Cup -- Honey Teaspoon -- Honey Cup -- Nectar Teaspoon -- Nectar Cup -- Nectar Straw -- Thin Teaspoon -- Thin Cup -- Thin Straw -- Puree -- Mechanical Soft -- Regular -- Multi-consistency -- Pill -- Cervical Esophageal Comment -- No flowsheet data found. Maxcine HamLaura Paiewonsky, M.A. CCC-SLP [phone] Maxcine Hamaiewonsky, Laura 03/27/2016, 2:40 PM               Microbiology: No results found for this or any previous visit (from the past 240 hour(s)).   Labs: Basic Metabolic Panel:  No results for input(s): NA, K, CL, CO2, GLUCOSE, BUN, CREATININE, CALCIUM, MG, PHOS in the last 168 hours. Liver Function Tests: No results for input(s): AST, ALT, ALKPHOS, BILITOT, PROT, ALBUMIN in the last 168 hours. No results for input(s): LIPASE, AMYLASE in the last 168 hours. No results for input(s): AMMONIA in the last 168 hours. CBC: No results for input(s): WBC, NEUTROABS, HGB, HCT, MCV, PLT in the last 168 hours. Cardiac Enzymes: No results for input(s): CKTOTAL, CKMB, CKMBINDEX, TROPONINI in the last 168 hours. BNP: BNP (last 3 results)  Recent Labs  02/24/16 2000  BNP 983.4*    ProBNP (last 3 results) No results for input(s): PROBNP in the last 8760 hours.  CBG: No results for input(s): GLUCAP in the last 168 hours.     SignedKathlen Mody MD.  [facility] 04/16/2016, 11:24 PM

## 2016-04-17 ENCOUNTER — Telehealth: Payer: Self-pay | Admitting: Family Medicine

## 2016-04-17 LAB — TSH: TSH: 14.39 m[IU]/L — AB

## 2016-04-17 NOTE — Progress Notes (Signed)
Subjective:  Patient ID: Kendra PootNaseem Osborne, female    DOB: 08/12/1946  Age: 70 y.o. MRN: [id number]  CC: Hospitalization Follow-up and Respiratory Distress   HPI Kendra Osborne is a 70 year old [place] female with a history of hypertension who [place] the [place] a few months ago with a large goiter who presented to the ED with respiratory distress, cough, fever and chills and was intubated and treatment initiated for bronchitis. 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 She was extubated and intubated secondary to hypoxia and subsequently underwent placement of tracheostomy tube on 03/09/16.  Hospital course was complicated by pulmonary edema, viral bronchitis/pneumonitis for which she received several antibiotics; she also had placement of a gastrostomy tube. 2-D echo in 02/2016 revealed EF of 60-65%, grade 1 diastolic dysfunction.  She was subsequently transferred to comprehensive inpatient rehabilitation after which she was discharged with home health care.  Interval history: She is accompanied by her son today. She complains of persisting cough and also has some low back pain and pain in both knees which have worsened ever since she got discharged from the hospital. She uses a wheelchair most of the time at home.   Outpatient Prescriptions Prior to Visit  Medication Sig Dispense Refill  . acetaminophen (TYLENOL) 650 MG CR tablet Take 650 mg by mouth 2 (two) times daily as needed for pain.    Marland Kitchen. albuterol (PROVENTIL) (2.5 MG/3ML) 0.083% nebulizer solution Take 3 mLs (2.5 mg total) by nebulization every 2 (two) hours as needed for wheezing. 75 mL 12  . aspirin EC 81 MG tablet Take 81 mg by mouth daily as needed (for high blood pressure).    . bacitracin ointment Apply topically 3 (three) times daily. 120 g 0  . docusate (COLACE) 50 MG/5ML liquid  Take 10 mLs (100 mg total) by mouth 2 (two) times daily. 100 mL 0  . feeding supplement, GLUCERNA SHAKE, (GLUCERNA SHAKE) LIQD Take 237 mLs by mouth 3 (three) times daily between meals.  0  . furosemide (LASIX) 40 MG tablet Place 1 tablet (40 mg total) into feeding tube 2 (two) times daily. 30 tablet 0  . levothyroxine (SYNTHROID, LEVOTHROID) 100 MCG tablet Take 1 tablet (100 mcg total) by mouth daily before breakfast. 30 tablet 0  . Maltodextrin-Xanthan Gum (RESOURCE THICKENUP CLEAR) POWD As needed    . pantoprazole sodium (PROTONIX) 40 mg/20 mL PACK Place 20 mLs (40 mg total) into feeding tube daily. 30 each 1  . polyethylene glycol (MIRALAX / GLYCOLAX) packet Place 17 g into feeding tube daily. 14 each 0   No facility-administered medications prior to visit.    ROS Review of Systems  Constitutional: Negative for activity change, appetite change and fatigue.  HENT: Negative for congestion, sinus pressure and sore throat.   Eyes: Negative for visual disturbance.  Respiratory: Positive for cough. Negative for chest tightness, shortness of breath and wheezing.   Cardiovascular: Negative for chest pain and palpitations.  Gastrointestinal: Negative for abdominal pain, constipation and abdominal distention.  Endocrine: Negative for polydipsia.  Genitourinary: Negative for dysuria and frequency.  Musculoskeletal: Positive for back pain (Bilateral knee pain) and arthralgias.  Skin: Negative for rash.  Neurological: Negative for tremors, light-headedness and numbness.  Hematological: Does not bruise/bleed easily.  Psychiatric/Behavioral: Negative for behavioral problems and agitation.    Objective:  BP 132/96 mmHg  Pulse 83  Temp(Src) 98.3 F (36.8 C) (Oral)  Resp 16  Ht 5' (1.524 m)  SpO2 100%  BP/Weight 04/16/2016 04/09/2016 02/24/2016  Systolic BP 132 164 -  Diastolic BP 96 77 -  Wt. (Lbs) - 210.6 -  BMI - - 41.13      Physical Exam  Constitutional: She is oriented to person,  place, and time. She appears well-developed and well-nourished.  Appears comfortable, sitting in a wheelchair  Neck:  Tracheostomy in place; patient on 4 L of oxygen  Cardiovascular: Normal rate, normal heart sounds and intact distal pulses.   No murmur heard. Pulmonary/Chest: Effort normal and breath sounds normal. She has no wheezes. She has no rales. She exhibits no tenderness.  Abdominal: Soft. Bowel sounds are normal. She exhibits no distension and no mass. There is no tenderness.  PEG tube in place  Musculoskeletal: She exhibits tenderness (tenderness on palpation and range of motion of both knees with associated crepitus).  Neurological: She is alert and oriented to person, place, and time.  Skin: Skin is warm and dry.  Psychiatric: She has a normal mood and affect.     Assessment & Plan:   1. Tracheostomy in place [facility]) Continue trach care Scheduled to see ENT on 04/19/16  2. Acute on chronic respiratory failure with hypercapnia (HCC) Currently on 4 L of oxygen  3. Tracheostomy care [facility]) Patient has home health nursing which assist with this.  4. Postoperative hypothyroidism Continue levothyroxine - TSH  5. Cough Placed on antitussives which will be mixed with a thickener as she is high-risk for aspiration - HYDROcodone-homatropine (HYCODAN) 5-1.5 MG/5ML syrup; Take 5 mLs by mouth every 8 (eight) hours as needed for cough.  Dispense: 120 mL; Refill: 0  6. Arthralgia Knee joint pains are secondary to limited ambulation and prolonged lying in bed Advised That She Could Use OTC Tylenol suspension mixed with a thickener   Meds ordered this encounter  Medications  . HYDROcodone-homatropine (HYCODAN) 5-1.5 MG/5ML syrup    Sig: Take 5 mLs by mouth every 8 (eight) hours as needed for cough.    Dispense:  120 mL    Refill:  0    Follow-up: 2 weeks   Jaclyn Shaggy MD

## 2016-04-17 NOTE — Telephone Encounter (Signed)
Deanna from [facility] called stating she went to visit the pt. And no one answered the door.

## 2016-04-18 ENCOUNTER — Other Ambulatory Visit: Payer: Self-pay | Admitting: Family Medicine

## 2016-04-18 MED ORDER — LEVOTHYROXINE SODIUM 112 MCG PO TABS: 112.0000 ug | ORAL_TABLET | Freq: Every day | ORAL | Status: DC

## 2016-04-19 ENCOUNTER — Telehealth: Payer: Self-pay

## 2016-04-19 NOTE — Telephone Encounter (Addendum)
Placed call to patient number on file. Patient son answered the phone and stated that the patient was not available. Patient son said i could leave the reason for my call, but I explained to the patient son that i couldn't release his mother's info due to her not giving consent to him. Told son I would call back.

## 2016-04-19 NOTE — Telephone Encounter (Signed)
-----   Message from Jaclyn ShaggyEnobong Amao, MD sent at 04/18/2016  8:30 AM EDT ----- Please inform her that her thyroid is abnormal and so I have increased her dose of levothyroxine with a new prescription sent to her pharmacy.

## 2016-04-19 NOTE — Telephone Encounter (Signed)
-----   Message from Enobong Amao, MD sent at 04/18/2016  8:30 AM EDT ----- Please inform her that her thyroid is abnormal and so I have increased her dose of levothyroxine with a new prescription sent to her pharmacy. 

## 2016-04-26 NOTE — Telephone Encounter (Signed)
Placed call to patient, patient wasn't available to take my call. Patient son stated that she wasn't available but he would see her in 15-20 minutes.

## 2016-04-30 ENCOUNTER — Telehealth: Payer: Self-pay | Admitting: Family Medicine

## 2016-04-30 NOTE — Telephone Encounter (Addendum)
[facility] Nettie ElmSylvia [phone]. Interpreter called and left a message for the patient to return my call. Letter will be sent to patient address on file.  3rd attempt to contact patient will be placed to patient today, 04/30/2016 before to close of business.

## 2016-04-30 NOTE — Telephone Encounter (Signed)
Pt's son called back returning nurse's call, please f/up

## 2016-05-01 ENCOUNTER — Encounter: Payer: Self-pay | Admitting: Family Medicine

## 2016-05-01 ENCOUNTER — Ambulatory Visit: Payer: Self-pay | Attending: Family Medicine | Admitting: Family Medicine

## 2016-05-01 VITALS — BP 150/84 | HR 82 | Temp 98.0°F | Resp 16 | Ht 60.0 in

## 2016-05-01 DIAGNOSIS — Z931 Gastrostomy status: Secondary | ICD-10-CM | POA: Insufficient documentation

## 2016-05-01 DIAGNOSIS — Z7982 Long term (current) use of aspirin: Secondary | ICD-10-CM | POA: Insufficient documentation

## 2016-05-01 DIAGNOSIS — R05 Cough: Secondary | ICD-10-CM | POA: Insufficient documentation

## 2016-05-01 DIAGNOSIS — E890 Postprocedural hypothyroidism: Secondary | ICD-10-CM | POA: Insufficient documentation

## 2016-05-01 DIAGNOSIS — Z79899 Other long term (current) drug therapy: Secondary | ICD-10-CM | POA: Insufficient documentation

## 2016-05-01 DIAGNOSIS — Z90710 Acquired absence of both cervix and uterus: Secondary | ICD-10-CM

## 2016-05-01 DIAGNOSIS — J9622 Acute and chronic respiratory failure with hypercapnia: Secondary | ICD-10-CM | POA: Insufficient documentation

## 2016-05-01 DIAGNOSIS — Z4682 Encounter for fitting and adjustment of non-vascular catheter: Secondary | ICD-10-CM | POA: Insufficient documentation

## 2016-05-01 DIAGNOSIS — R059 Cough, unspecified: Secondary | ICD-10-CM

## 2016-05-01 NOTE — Telephone Encounter (Signed)
Okay to give verbal order to continue with therapy and nursing

## 2016-05-01 NOTE — Progress Notes (Signed)
Subjective:    Patient ID: Kendra Osborne, female    DOB: [birth year], 70 y.o.   MRN: [id number]  HPI She is a 70 year old [place] female with a history of hypertension who relocated to the [place] a few months ago with a large goiter who presented to the ED with respiratory distress, cough, fever and chills and was intubated and treatment initiated for bronchitis. 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 She was extubated and intubated secondary to hypoxia and subsequently underwent placement of tracheostomy tube on 03/09/16. Hospital course was complicated by pulmonary edema, viral bronchitis/pneumonitis for which she received several antibiotics; she also had placement of a gastrostomy tube.  Interval history: Since her last office visit she has been seen by ENT with removal of the tracheostomy tube and the patient is eager to resume ingesting solids. She continues to complain of cough with slight yellowish mucus production however her son states the cough has improved since her last visit especially with the use of antihypertensive. She is also wondering when the gastrostomy tube Would be removed as she has pain at the site She also feels dizzy when she tries to get up from her wheelchair; she is wheel chair bound for the most part due to arthralgias for which i had recommended Tylenol.  Past Medical History  Diagnosis Date  . Hypertension   . Chronic diastolic CHF (congestive heart failure) [facility])     Past Surgical History  Procedure Laterality Date  . Thyroidectomy N/A 02/28/2016    Procedure:  TOTAL THYROIDECTOMY WITH NIMS MONITOR;  Surgeon: Melvenia Beam, MD;  Location: [facility] OR;  Service: ENT;  Laterality: N/A;  . Tracheostomy tube placement N/A 03/09/2016    Procedure: TRACHEOSTOMY;  Surgeon: Melvenia Beam, MD;  Location[facility] OR;  Service: ENT;   Laterality: N/A;    Not on File  Current Outpatient Prescriptions on File Prior to Visit  Medication Sig Dispense Refill  . acetaminophen (TYLENOL) 650 MG CR tablet Take 650 mg by mouth 2 (two) times daily as needed for pain.    Marland Kitchen albuterol (PROVENTIL) (2.5 MG/3ML) 0.083% nebulizer solution Take 3 mLs (2.5 mg total) by nebulization every 2 (two) hours as needed for wheezing. 75 mL 12  . aspirin EC 81 MG tablet Take 81 mg by mouth daily as needed (for high blood pressure).    . bacitracin ointment Apply topically 3 (three) times daily. 120 g 0  . docusate (COLACE) 50 MG/5ML liquid Take 10 mLs (100 mg total) by mouth 2 (two) times daily. 100 mL 0  . feeding supplement, GLUCERNA SHAKE, (GLUCERNA SHAKE) LIQD Take 237 mLs by mouth 3 (three) times daily between meals.  0  . furosemide (LASIX) 40 MG tablet Place 1 tablet (40 mg total) into feeding tube 2 (two) times daily. 30 tablet 0  . HYDROcodone-homatropine (HYCODAN) 5-1.5 MG/5ML syrup Take 5 mLs by mouth every 8 (eight) hours as needed for cough. 120 mL 0  . levothyroxine (SYNTHROID, LEVOTHROID) 112 MCG tablet Take 1 tablet (112 mcg total) by mouth daily before breakfast. 30 tablet 1  . Maltodextrin-Xanthan Gum (RESOURCE THICKENUP CLEAR) POWD As needed    . pantoprazole sodium (PROTONIX) 40 mg/20 mL PACK Place 20 mLs (40 mg total) into feeding tube daily. 30 each 1  . polyethylene glycol (MIRALAX / GLYCOLAX) packet Place 17 g into feeding tube daily. 14 each 0   No  current facility-administered medications on file prior to visit.     Review of Systems Constitutional: Negative for activity change, appetite change and fatigue.  HENT: Negative for congestion, sinus pressure and sore throat.   Eyes: Negative for visual disturbance.  Respiratory: Positive for cough. Negative for chest tightness, shortness of breath and wheezing.   Cardiovascular: Negative for chest pain and palpitations.  Gastrointestinal: Negative for abdominal pain,  constipation and abdominal distention.  Endocrine: Negative for polydipsia.  Genitourinary: Negative for dysuria and frequency.  Musculoskeletal: Positive for back pain (Bilateral knee pain) and arthralgias.  Skin: Negative for rash.  Neurological: Negative for tremors, light-headedness and numbness.  Hematological: Does not bruise/bleed easily.  Psychiatric/Behavioral: Negative for behavioral problems and agitation.      Objective: Filed Vitals:   05/01/16 1409  BP: 150/84  Pulse: 82  Temp: 98 F (36.7 C)  TempSrc: Oral  Resp: 16  Height: 5' (1.524 m)  SpO2: 87%      Physical Exam Constitutional: She is oriented to person, place, and time. She appears well-developed and well-nourished.  Appears comfortable, sitting in a wheelchair  Neck:  Osteum at site of tracheostomy looks clean Cardiovascular: Normal rate, normal heart sounds and intact distal pulses.   No murmur heard. Pulmonary/Chest: Effort normal and breath sounds normal. She has no wheezes. She has no rales. She exhibits no tenderness.  Abdominal: Soft. Bowel sounds are normal. She exhibits no distension and no mass. There is no tenderness.  PEG tube in place , no evidence of infection Musculoskeletal: She exhibits tenderness (tenderness on palpation and range of motion of both knees with associated crepitus).  Neurological: She is alert and oriented to person, place, and time.  Skin: Skin is warm and dry.  Psychiatric: She has a normal mood and affect.        Lab Results  Component Value Date   TSH 14.39* 04/16/2016    Assessment & Plan:  1. S/p Tracheostomy  (HCC) Ostium looks clean Continue dressing Aspiration precautions I have explained to the patient and her son that she is at the high risk of aspiration and I will need to order a barium swallow to assess swallowing function at her next office visit but the patient is unhappy with this and she is eager to resume eating solids.  2. Acute on chronic  respiratory failure with hypercapnia (HCC) Currently on 4 L of oxygen which she does not have with her hence hypoxia Advised seat use oxygen at rest on 2 her respiratory function recovers   3. Postoperative hypothyroidism Levothyroxine dose was adjusted after last set of labs  4. Cough Continue - HYDROcodone-homatropine (HYCODAN) 5-1.5 MG/5ML syrup; Take 5 mLs by mouth every 8 (eight) hours as needed for cough.  Dispense: 120 mL; Refill: 0  5. Gastrostomy tube in place Tube secured with tape She will have this in place until a normal barium swallow is obtained after which IR referral will be placed for removal

## 2016-05-01 NOTE — Telephone Encounter (Signed)
Patient's in clinic today for an appt, will dicuss lab results with patient.

## 2016-05-01 NOTE — Telephone Encounter (Signed)
Deanna from [facility] called to get orders to continue  Physical therapy, Occupational therapy, Social Worker, speech therapy  and Continuation of nursing. Please f/u

## 2016-05-01 NOTE — Progress Notes (Signed)
Patient's here for cough. F/up for trach.  Patient c/o pain from the feeding tube.  Patient feeling dizzy patient is wheelchair bound, can't stand or sit on table to perform othrostatic.  Spot in her tongue x1wk now.  Patient requesting med refill.  Patient was given lab results during visit.

## 2016-05-14 ENCOUNTER — Encounter: Payer: Self-pay | Admitting: Family Medicine

## 2016-05-14 ENCOUNTER — Ambulatory Visit: Payer: Self-pay | Attending: Family Medicine | Admitting: Family Medicine

## 2016-05-14 VITALS — BP 135/84 | HR 89 | Temp 98.4°F | Resp 18 | Ht <= 58 in | Wt 190.0 lb

## 2016-05-14 DIAGNOSIS — Z90710 Acquired absence of both cervix and uterus: Secondary | ICD-10-CM

## 2016-05-14 DIAGNOSIS — Z931 Gastrostomy status: Secondary | ICD-10-CM

## 2016-05-14 NOTE — Progress Notes (Signed)
Subjective:  Patient ID: Kendra PootNaseem Osborne, female    DOB: 04/17/1946  Age: 70 y.o. 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  She is accompanied by her son and complains of pain at the site of her G-tube and would like it taken out; she has been eating pureed diets and son denies choking but the patient coughs intermittently.   Outpatient Prescriptions Prior to Visit  Medication Sig Dispense Refill  . acetaminophen (TYLENOL) 650 MG CR tablet Take 650 mg by mouth 2 (two) times daily as needed for pain.    . bacitracin ointment Apply topically 3 (three) times daily. 120 g 0  . docusate (COLACE) 50 MG/5ML liquid Take 10 mLs (100 mg total) by mouth 2 (two) times daily. 100 mL 0  . feeding supplement, GLUCERNA SHAKE, (GLUCERNA SHAKE) LIQD Take 237 mLs by mouth 3 (three) times daily between meals.  0  . furosemide (LASIX) 40 MG tablet Place 1 tablet (40 mg total) into feeding tube 2 (two) times daily. 30 tablet 0  . levothyroxine (SYNTHROID, LEVOTHROID) 112 MCG tablet Take 1 tablet (112 mcg total) by mouth daily before breakfast. 30 tablet 1  . Maltodextrin-Xanthan Gum (RESOURCE THICKENUP CLEAR) POWD As needed    . pantoprazole sodium (PROTONIX) 40 mg/20 mL PACK Place 20 mLs (40 mg total) into feeding tube daily. 30 each 1  . polyethylene glycol (MIRALAX / GLYCOLAX) packet Place 17 g into feeding tube daily. 14 each 0  . albuterol (PROVENTIL) (2.5 MG/3ML) 0.083% nebulizer solution Take 3 mLs (2.5 mg total) by nebulization every 2 (two) hours as needed for wheezing. (Patient not taking: Reported on 05/14/2016) 75 mL 12  . aspirin EC 81 MG tablet Take 81 mg by mouth daily as needed (for high blood  pressure). Reported on 05/14/2016    . HYDROcodone-homatropine (HYCODAN) 5-1.5 MG/5ML syrup Take 5 mLs by mouth every 8 (eight) hours as needed for cough. (Patient not taking: Reported on 05/14/2016) 120 mL 0   No facility-administered medications prior to visit.    ROS Review of Systems Constitutional: Negative for activity change, appetite change and fatigue.  HENT: Negative for congestion, sinus pressure and sore throat.   Eyes: Negative for visual disturbance.  Respiratory: Positive for cough. Negative for chest tightness, shortness of breath and wheezing.   Cardiovascular: Negative for chest pain and palpitations.  Gastrointestinal: positive for abdominal pain, constipation and abdominal distention.  Endocrine: Negative for polydipsia.  Genitourinary: Negative for dysuria and frequency.  Musculoskeletal: Positive for back pain (Bilateral knee pain) and arthralgias.  Skin: Negative for rash.  Neurological: Negative for tremors, light-headedness and numbness.  Hematological: Does not bruise/bleed easily.  Psychiatric/Behavioral: Negative for behavioral problems and agitation.  Objective:  BP 135/84 mmHg  Pulse 89  Temp(Src) 98.4 F (36.9 C) (Oral)  Resp 18  Ht 4\' 2"  (1.27 m)  Wt 190 lb (86.183 kg)  BMI 53.43 kg/m2  SpO2 96%  BP/Weight 05/14/2016 05/01/2016 04/16/2016  Systolic BP 135 150 132  Diastolic BP 84 84 96  Wt. (Lbs) 190 - -  BMI 53.43 - -      Physical Exam Constitutional: She is oriented to person, place, and time. She appears well-developed and well-nourished.  Appears comfortable, sitting in a wheelchair  Neck:  Osteum at site of tracheostomy looks clean Cardiovascular: Normal rate, normal heart sounds and intact distal pulses.   No murmur heard. Pulmonary/Chest: Effort normal and breath sounds normal. She has no wheezes. She has no rales. She exhibits no tenderness.  Abdominal: Soft. Bowel sounds are normal. She exhibits no distension and no mass. There is no  tenderness.  PEG tube in place , no evidence of infection Psychiatric: She has a normal mood and affect.   Assessment & Plan:   1. Status post trachelectomy Keep dressing in place until postterm closes - SLP modified barium swallow; Future  2. Gastrostomy tube in place [facility]) Once barium swallow report is obtained referral for IR removal she will be placed. - SLP modified barium swallow; Future   No orders of the defined types were placed in this encounter.    Follow-up: Return in about 1 month (around 06/13/2016) for Follow-up on G tube removal.   Jaclyn Shaggy MD

## 2016-05-15 ENCOUNTER — Other Ambulatory Visit (HOSPITAL_COMMUNITY): Payer: Self-pay | Admitting: Family Medicine

## 2016-05-15 DIAGNOSIS — R1310 Dysphagia, unspecified: Secondary | ICD-10-CM

## 2016-05-16 ENCOUNTER — Inpatient Hospital Stay (HOSPITAL_COMMUNITY): Admission: RE | Admit: 2016-05-16 | Payer: Self-pay | Source: Ambulatory Visit

## 2016-05-16 ENCOUNTER — Ambulatory Visit (HOSPITAL_COMMUNITY): Admission: RE | Admit: 2016-05-16 | Payer: MEDICAID | Source: Ambulatory Visit

## 2016-05-17 ENCOUNTER — Other Ambulatory Visit (HOSPITAL_COMMUNITY): Payer: Self-pay | Admitting: Family Medicine

## 2016-05-17 DIAGNOSIS — R1310 Dysphagia, unspecified: Secondary | ICD-10-CM

## 2016-05-18 ENCOUNTER — Telehealth: Payer: Self-pay

## 2016-05-18 ENCOUNTER — Ambulatory Visit (HOSPITAL_COMMUNITY)
Admission: RE | Admit: 2016-05-18 | Discharge: 2016-05-18 | Disposition: A | Discharge status: Home / Self Care | Payer: Self-pay | Source: Ambulatory Visit | Attending: Family Medicine | Admitting: Family Medicine

## 2016-05-18 ENCOUNTER — Ambulatory Visit (HOSPITAL_COMMUNITY)
Admission: RE | Admit: 2016-05-18 | Discharge: 2016-05-18 | Disposition: A | Discharge status: Home / Self Care | Payer: MEDICAID | Source: Ambulatory Visit | Attending: Family Medicine | Admitting: Family Medicine

## 2016-05-18 ENCOUNTER — Other Ambulatory Visit: Payer: Self-pay | Admitting: Family Medicine

## 2016-05-18 DIAGNOSIS — Z931 Gastrostomy status: Secondary | ICD-10-CM

## 2016-05-18 DIAGNOSIS — I110 Hypertensive heart disease with heart failure: Secondary | ICD-10-CM | POA: Insufficient documentation

## 2016-05-18 DIAGNOSIS — R1310 Dysphagia, unspecified: Secondary | ICD-10-CM

## 2016-05-18 DIAGNOSIS — Z90710 Acquired absence of both cervix and uterus: Secondary | ICD-10-CM

## 2016-05-18 DIAGNOSIS — Z930 Tracheostomy status: Secondary | ICD-10-CM | POA: Insufficient documentation

## 2016-05-18 DIAGNOSIS — R1313 Dysphagia, pharyngeal phase: Secondary | ICD-10-CM | POA: Insufficient documentation

## 2016-05-18 DIAGNOSIS — I5032 Chronic diastolic (congestive) heart failure: Secondary | ICD-10-CM | POA: Insufficient documentation

## 2016-05-18 NOTE — Telephone Encounter (Signed)
Writer called and spoke with patient's son regarding the results of the barium swallow performed today.  Notes Recorded by Jaclyn ShaggyEnobong Amao, MD on 05/18/2016 at 4:36 PM Please inform the patient I have placed an order for removal of the PEG tube however she is to remain on nectar thick liquids and regular diet due to aspiration of thin liquids on barium swallow; IR will contact her for an appointment. Also recommended to call ENT for follow-up reassessment for airway closure as she still had mild aspiration risk as per report of barium swallow   Writer explained the results to patient's son who stated understanding that patient is to stay on thick liquids and a regular diet.  IR will be calling them to schedule a PEG tube removal and he should contact ENT to schedule and appt for a reassessment.  Patient's son stated understanding.

## 2016-05-28 ENCOUNTER — Other Ambulatory Visit: Payer: Self-pay | Admitting: Family Medicine

## 2016-05-28 ENCOUNTER — Telehealth: Payer: Self-pay | Admitting: Family Medicine

## 2016-05-28 DIAGNOSIS — Z931 Gastrostomy status: Secondary | ICD-10-CM

## 2016-05-28 NOTE — Telephone Encounter (Signed)
I referred her for PEG tube removal on 05/18/16; could you please follow up with IR to see what the delay is. She is currently not on medications for her knees and will need some form of conservative management first prior to considering Ortho referral. BP is elevated due to pain; will need to be evaluated.

## 2016-05-28 NOTE — Telephone Encounter (Signed)
Kendra Osborne P.T from [facility] called states pt's BP has been elevated 180/100 , today bp was 140/90. 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 Therapist would like to know when tube would be removed and location here with PCP or ENT. Please f/up

## 2016-05-30 NOTE — Telephone Encounter (Signed)
Patient is to have PEG tube removed Thursday 05/31/16.

## 2016-05-31 ENCOUNTER — Ambulatory Visit (HOSPITAL_COMMUNITY)
Admission: RE | Admit: 2016-05-31 | Discharge: 2016-05-31 | Disposition: A | Discharge status: Home / Self Care | Payer: Self-pay | Source: Ambulatory Visit | Attending: Family Medicine | Admitting: Family Medicine

## 2016-05-31 DIAGNOSIS — Z431 Encounter for attention to gastrostomy: Secondary | ICD-10-CM | POA: Insufficient documentation

## 2016-05-31 DIAGNOSIS — Z931 Gastrostomy status: Secondary | ICD-10-CM

## 2016-05-31 MED ORDER — LIDOCAINE VISCOUS 2 % MT SOLN
OROMUCOSAL | Status: AC
  Filled 2016-05-31: qty 15

## 2016-08-14 ENCOUNTER — Emergency Department (HOSPITAL_COMMUNITY)
Admission: EM | Admit: 2016-08-14 | Discharge: 2016-08-15 | Disposition: A | Discharge status: Home / Self Care | Payer: Medicaid Other | Attending: Emergency Medicine | Admitting: Emergency Medicine

## 2016-08-14 ENCOUNTER — Encounter (HOSPITAL_COMMUNITY): Payer: Self-pay | Admitting: Emergency Medicine

## 2016-08-14 DIAGNOSIS — K5909 Other constipation: Secondary | ICD-10-CM

## 2016-08-14 DIAGNOSIS — Z760 Encounter for issue of repeat prescription: Secondary | ICD-10-CM | POA: Insufficient documentation

## 2016-08-14 DIAGNOSIS — E890 Postprocedural hypothyroidism: Secondary | ICD-10-CM | POA: Insufficient documentation

## 2016-08-14 DIAGNOSIS — I5032 Chronic diastolic (congestive) heart failure: Secondary | ICD-10-CM | POA: Insufficient documentation

## 2016-08-14 DIAGNOSIS — I110 Hypertensive heart disease with heart failure: Secondary | ICD-10-CM | POA: Insufficient documentation

## 2016-08-14 DIAGNOSIS — Z7982 Long term (current) use of aspirin: Secondary | ICD-10-CM | POA: Insufficient documentation

## 2016-08-14 DIAGNOSIS — R0602 Shortness of breath: Secondary | ICD-10-CM

## 2016-08-14 LAB — CBC WITH DIFFERENTIAL/PLATELET
BASOS ABS: 0 10*3/uL (ref 0.0–0.1)
BASOS PCT: 0 %
EOS ABS: 0.1 10*3/uL (ref 0.0–0.7)
EOS PCT: 2 %
HCT: 38.5 % (ref 36.0–46.0)
HEMOGLOBIN: 11.7 g/dL — AB (ref 12.0–15.0)
LYMPHS ABS: 2.8 10*3/uL (ref 0.7–4.0)
Lymphocytes Relative: 35 %
MCH: 27.1 pg (ref 26.0–34.0)
MCHC: 30.4 g/dL (ref 30.0–36.0)
MCV: 89.3 fL (ref 78.0–100.0)
Monocytes Absolute: 0.5 10*3/uL (ref 0.1–1.0)
Monocytes Relative: 6 %
NEUTROS PCT: 57 %
Neutro Abs: 4.5 10*3/uL (ref 1.7–7.7)
PLATELETS: 268 10*3/uL (ref 150–400)
RBC: 4.31 MIL/uL (ref 3.87–5.11)
RDW: 18 % — ABNORMAL HIGH (ref 11.5–15.5)
WBC: 8 10*3/uL (ref 4.0–10.5)

## 2016-08-14 LAB — COMPREHENSIVE METABOLIC PANEL
ALBUMIN: 3.4 g/dL — AB (ref 3.5–5.0)
ALT: 15 U/L (ref 14–54)
AST: 25 U/L (ref 15–41)
Alkaline Phosphatase: 60 U/L (ref 38–126)
Anion gap: 4 — ABNORMAL LOW (ref 5–15)
BUN: 21 mg/dL — ABNORMAL HIGH (ref 6–20)
CHLORIDE: 100 mmol/L — AB (ref 101–111)
CO2: 33 mmol/L — AB (ref 22–32)
CREATININE: 0.95 mg/dL (ref 0.44–1.00)
Calcium: 9 mg/dL (ref 8.9–10.3)
GFR calc non Af Amer: >60 mL/min (ref >60)
GLUCOSE: 92 mg/dL (ref 65–99)
Potassium: 4 mmol/L (ref 3.5–5.1)
SODIUM: 137 mmol/L (ref 135–145)
Total Bilirubin: 0.4 mg/dL (ref 0.3–1.2)
Total Protein: 7.4 g/dL (ref 6.5–8.1)

## 2016-08-14 LAB — URINALYSIS, ROUTINE W REFLEX MICROSCOPIC
Bilirubin Urine: NEGATIVE
Glucose, UA: NEGATIVE mg/dL
Hgb urine dipstick: NEGATIVE
KETONES UR: NEGATIVE mg/dL
NITRITE: NEGATIVE
PH: 6 (ref 5.0–8.0)
PROTEIN: 30 mg/dL — AB
Specific Gravity, Urine: 1.025 (ref 1.005–1.030)

## 2016-08-14 LAB — URINE MICROSCOPIC-ADD ON: RBC / HPF: NONE SEEN RBC/hpf (ref 0–5)

## 2016-08-14 MED ORDER — DOCUSATE SODIUM 50 MG/5ML PO LIQD: 100.0000 mg | Freq: Two times a day (BID) | ORAL | 0 refills | Status: DC

## 2016-08-14 MED ORDER — POLYETHYLENE GLYCOL 3350 17 G PO PACK: 17.0000 g | PACK | Freq: Every day | ORAL | 0 refills | Status: DC

## 2016-08-14 MED ORDER — PANTOPRAZOLE SODIUM 40 MG PO PACK: 40.0000 mg | PACK | Freq: Every day | ORAL | 1 refills | Status: DC

## 2016-08-14 MED ORDER — LEVOTHYROXINE SODIUM 112 MCG PO TABS: 112.0000 ug | ORAL_TABLET | Freq: Every day | ORAL | 1 refills | Status: DC

## 2016-08-14 MED ORDER — FUROSEMIDE 40 MG PO TABS: 40.0000 mg | ORAL_TABLET | Freq: Two times a day (BID) | ORAL | 0 refills | Status: DC

## 2016-08-14 MED ORDER — ASPIRIN EC 81 MG PO TBEC: 81.0000 mg | DELAYED_RELEASE_TABLET | Freq: Every day | ORAL | 0 refills | Status: DC | PRN

## 2016-08-14 MED ORDER — ALBUTEROL SULFATE (2.5 MG/3ML) 0.083% IN NEBU: 2.5000 mg | INHALATION_SOLUTION | RESPIRATORY_TRACT | 12 refills | Status: DC | PRN

## 2016-08-14 NOTE — ED Triage Notes (Signed)
Pt. presents with multiple complaints : Constipation for several days , chills , elevated blood pressure , fatigue , secretions from tracheostomy for several weeks , denies fever , alert and oriented / respirations unlabored . No emesis or diarrhea .

## 2016-08-14 NOTE — ED Provider Notes (Signed)
[facility]-EMERGENCY DEPT Provider Note   CSN: [id number] Arrival date & time: 08/14/16  1943  By signing my name below, I, Christel Mormon, attest that this documentation has been prepared under the direction and in the presence of Shon Baton, MD . Electronically Signed: Christel Mormon, Scribe. 08/14/2016. 11:43 PM.    History   Chief Complaint Chief Complaint  Patient presents with  . Constipation  . Hypertension  . Chills  . Other    Tracheostomy secretions/ pain   . Fatigue    The history is provided by the patient and a relative. The history is limited by a language barrier. A language interpreter was used.   HPI Comments:  Kendra Osborne is a 70 y.o. female with PSHx of thyroidectomy. g tube placement and tracheostomy who presents to the Emergency Department With multiple complaints. Patient reports neck pain and secretions from her tracheostomy 6 site. Pt describe her pain as 10/10. Family reports that she is feeling fatigued and falling asleep all the time. Pt had thyroid surgery 3 months ago and was having these symptoms prior to surgery. After surgery, family member notes that symptoms seemed to have improved during a 53-month hospital stay, then,1 month after surgery, the symptoms began to appear again.  Pt complains of associated neck pain, secretions from tracheostomy, productive cough, intermittent fever, diaphoresis, chills/hot flash fluctuations, anxiety, weakness in legs, dry mouth, constipation, urinary frequency, elevated BP, and fatigue. Pt reports lower extremity swelling. Pt takes tylenol, does not note modifying factors.   Of note, the patient is not currently taking any medications at home. There is a significant language barrier and the family was unaware that they were able to get her medications with her Medicaid card. I have requested the help of case management.    Past Medical History:  Diagnosis Date  . Chronic diastolic CHF (congestive heart failure)  (HCC)   . Hypertension     Patient Active Problem List   Diagnosis Date Noted  . Abdominal pain   . Acute on chronic respiratory failure with hypercapnia (HCC)   . CAP (community acquired pneumonia)   . Aspiration pneumonia of right lower lobe due to vomit (HCC)   . Respiratory acidosis   . Dysphagia   . Acute renal failure (HCC)   . Acute respiratory failure with hypercapnia (HCC)   . Atelectasis   . Goiter   . Status post trachelectomy   . Essential hypertension   . Chronic diastolic CHF (congestive heart failure) (HCC)   . Hypokalemia   . Hypernatremia   . Postoperative hypothyroidism   . Acute respiratory failure (HCC)   . Acute respiratory failure with hypoxemia (HCC)   . Gastrostomy tube in place [facility])   . Acute respiratory failure with hypoxia (HCC) 03/07/2016  . Acute encephalopathy 03/07/2016  . Acute pulmonary edema (HCC) 03/07/2016  . Respiratory failure (HCC) 02/25/2016    Past Surgical History:  Procedure Laterality Date  . THYROIDECTOMY N/A 02/28/2016   Procedure:  TOTAL THYROIDECTOMY WITH NIMS MONITOR;  Surgeon: Melvenia Beam, MD;  Location: [facility] OR;  Service: ENT;  Laterality: N/A;  . TRACHEOSTOMY TUBE PLACEMENT N/A 03/09/2016   Procedure: TRACHEOSTOMY;  Surgeon: Melvenia Beam, MD;  Location: [facility] OR;  Service: ENT;  Laterality: N/A;    OB History    No data available       Home Medications    Prior to Admission medications   Medication Sig Start Date End Date Taking? Authorizing Provider  acetaminophen (TYLENOL) 650  MG CR tablet Take 650 mg by mouth 2 (two) times daily as needed for pain.    Historical Provider, MD  albuterol (PROVENTIL) (2.5 MG/3ML) 0.083% nebulizer solution Take 3 mLs (2.5 mg total) by nebulization every 2 (two) hours as needed for wheezing. 08/15/16   Shon Baton, MD  aspirin EC 81 MG tablet Take 1 tablet (81 mg total) by mouth daily as needed (for high blood pressure). Reported on 05/14/2016 08/15/16   Shon Baton, MD    bacitracin ointment Apply topically 3 (three) times daily. 04/09/16   Kathlen Mody, MD  docusate (COLACE) 50 MG/5ML liquid Take 10 mLs (100 mg total) by mouth 2 (two) times daily. 08/15/16   Shon Baton, MD  feeding supplement, GLUCERNA SHAKE, (GLUCERNA SHAKE) LIQD Take 237 mLs by mouth 3 (three) times daily between meals. 04/09/16   Kathlen Mody, MD  furosemide (LASIX) 40 MG tablet Take 1 tablet (40 mg total) by mouth 2 (two) times daily. 08/15/16   Shon Baton, MD  HYDROcodone-homatropine (HYCODAN) 5-1.5 MG/5ML syrup Take 5 mLs by mouth every 8 (eight) hours as needed for cough. Patient not taking: Reported on 05/14/2016 04/16/16   Jaclyn Shaggy, MD  levothyroxine (SYNTHROID, LEVOTHROID) 112 MCG tablet Take 1 tablet (112 mcg total) by mouth daily before breakfast. 08/15/16   Shon Baton, MD  Maltodextrin-Xanthan Gum (RESOURCE THICKENUP CLEAR) POWD As needed 04/09/16   Kathlen Mody, MD  pantoprazole sodium (PROTONIX) 40 mg/20 mL PACK Take 20 mLs (40 mg total) by mouth daily. 08/15/16   Shon Baton, MD  polyethylene glycol (MIRALAX / GLYCOLAX) packet Take 17 g by mouth daily. 08/15/16   Shon Baton, MD    Family History No family history on file.  Social History Social History  Substance Use Topics  . Smoking status: Never Smoker  . Smokeless tobacco: Never Used  . Alcohol use No     Allergies   Review of patient's allergies indicates not on file.   Review of Systems Review of Systems  Constitutional: Positive for chills, diaphoresis and fatigue.  Respiratory: Positive for cough.   Cardiovascular: Negative for chest pain.  Gastrointestinal: Positive for constipation.  Genitourinary: Positive for frequency.  Musculoskeletal: Positive for neck pain.  Neurological: Positive for weakness.  Psychiatric/Behavioral: The patient is nervous/anxious.   All other systems reviewed and are negative.    Physical Exam Updated Vital Signs BP 180/83   Pulse 70   Temp 97.8 F  (36.6 C) (Oral)   Resp (!) 28   Ht 4\' 5"  (1.346 m)   Wt 220 lb (99.8 kg)   SpO2 91%   BMI 55.06 kg/m   Physical Exam  Constitutional: She is oriented to person, place, and time.  Obese, no acute distress, chronically ill-appearing  HENT:  Head: Normocephalic.  Mucous membranes dry  Eyes: Pupils are equal, round, and reactive to light.  Neck: Neck supple.  Tracheostomy site with scant secretions at the stoma, polyp noted within the site, thyroidectomy scar well healing with keloid  Cardiovascular: Normal rate, regular rhythm and normal heart sounds.   No murmur heard. Pulmonary/Chest: Effort normal and breath sounds normal. No respiratory distress. She has no wheezes.  Abdominal: Soft. Bowel sounds are normal. There is no tenderness. There is no guarding.  Musculoskeletal:  Trace lower extremity edema  Neurological: She is alert and oriented to person, place, and time.  Skin: Skin is warm and dry.  Psychiatric: She has a normal mood and affect.  Nursing note and vitals reviewed.    ED Treatments / Results  DIAGNOSTIC STUDIES:  Oxygen Saturation is 94% on RA, normal by my interpretation.    COORDINATION OF CARE:  11:43 PM Will order urinalysis and acute abdominal series. Discussed treatment plan with pt at bedside and pt agreed to plan.  Labs (all labs ordered are listed, but only abnormal results are displayed) Labs Reviewed  CBC WITH DIFFERENTIAL/PLATELET - Abnormal; Notable for the following:       Result Value   Hemoglobin 11.7 (*)    RDW 18.0 (*)    All other components within normal limits  COMPREHENSIVE METABOLIC PANEL - Abnormal; Notable for the following:    Chloride 100 (*)    CO2 33 (*)    BUN 21 (*)    Albumin 3.4 (*)    Anion gap 4 (*)    All other components within normal limits  URINALYSIS, ROUTINE W REFLEX MICROSCOPIC (NOT AT [facility]) - Abnormal; Notable for the following:    APPearance CLOUDY (*)    Protein, ur 30 (*)    Leukocytes, UA SMALL (*)      All other components within normal limits  URINE MICROSCOPIC-ADD ON - Abnormal; Notable for the following:    Squamous Epithelial / LPF 6-30 (*)    Bacteria, UA RARE (*)    Crystals CA OXALATE CRYSTALS (*)    All other components within normal limits  TSH    EKG  EKG Interpretation None       Radiology Dg Abdomen Acute W/chest  Result Date: 08/15/2016 CLINICAL DATA:  70 year old female with shortness of breath and constipation EXAM: DG ABDOMEN ACUTE W/ 1V CHEST COMPARISON:  Chest radiograph dated 03/21/2016 and abdominal radiograph dated 03/25/2016 FINDINGS: There is cardiomegaly with vascular congestion. No focal consolidation, pleural effusion, or pneumothorax. Large amount of stool noted throughout the colon. There is no bowel dilatation or evidence of obstruction. No free air. A 7 mm calcific focus noted in the right lower quadrant as well as in the right hemipelvis. There is multilevel degenerative changes of the spine with lumbar dextroscoliosis. No acute fracture. IMPRESSION: Cardiomegaly with mild vascular congestion. Constipation.  No bowel obstruction. Electronically Signed   By: Elgie CollardArash  Radparvar M.D.   On: 08/15/2016 01:03    Procedures Procedures (including critical care time)  Medications Ordered in ED Medications  levothyroxine (SYNTHROID, LEVOTHROID) tablet 112 mcg (not administered)     Initial Impression / Assessment and Plan / ED Course  I have reviewed the triage vital signs and the nursing notes.  Pertinent labs & imaging results that were available during my care of the patient were reviewed by me and considered in my medical decision making (see chart for details).  Clinical Course    Patient presents with multiple complaints. Recent, located medical history. Not currently taking medications. She and her family were unclear of what she should be taking at home. She should be following up with [facility]. Most of her complaints are chronic in nature.  Fatigue, shortness of breath, waxing and waning edema, chills, and constipation. Clinically she is nontoxic-appearing. Lab work is largely reassuring. Chest x-ray shows mild vascular congestion but she is not appear overtly volume overloaded and is not requiring supplemental oxygen. She is supposed to be on Lasix. She was evaluated by case management is provided her with a nebulizer and mask. I will refill all of her medications. Discussed with her family that she is to follow-up closely with [facility] and take her medications as prescribed. She is given 1 dose of Synthroid prior to discharge.  After history, exam, and medical workup I feel the patient has been appropriately medically screened and is safe for discharge home. Pertinent diagnoses were discussed with the patient. Patient was given return precautions.   Final Clinical Impressions(s) / ED Diagnoses   Final diagnoses:  SOB (shortness of breath)  Other constipation  Medication refill    New Prescriptions New Prescriptions   No medications on file   I personally performed the services described in this documentation, which was scribed in my presence. The recorded information has been reviewed and is accurate.     Shon Baton, MD 08/15/16 [id number]

## 2016-08-15 ENCOUNTER — Emergency Department (HOSPITAL_COMMUNITY): Payer: Medicaid Other

## 2016-08-15 LAB — TSH: TSH: 78.186 u[IU]/mL — AB (ref 0.350–4.500)

## 2016-08-15 MED ORDER — PANTOPRAZOLE SODIUM 40 MG PO PACK: 40.0000 mg | PACK | Freq: Every day | ORAL | 1 refills | Status: AC

## 2016-08-15 MED ORDER — ASPIRIN EC 81 MG PO TBEC: 81.0000 mg | DELAYED_RELEASE_TABLET | Freq: Every day | ORAL | 0 refills | Status: AC | PRN

## 2016-08-15 MED ORDER — LEVOTHYROXINE SODIUM 112 MCG PO TABS: 112.0000 ug | ORAL_TABLET | Freq: Every day | ORAL | 1 refills | Status: AC

## 2016-08-15 MED ORDER — FUROSEMIDE 40 MG PO TABS: 40.0000 mg | ORAL_TABLET | Freq: Two times a day (BID) | ORAL | 0 refills | Status: AC

## 2016-08-15 MED ORDER — POLYETHYLENE GLYCOL 3350 17 G PO PACK: 17.0000 g | PACK | Freq: Every day | ORAL | 0 refills | Status: AC

## 2016-08-15 MED ORDER — ALBUTEROL SULFATE (2.5 MG/3ML) 0.083% IN NEBU: 2.5000 mg | INHALATION_SOLUTION | RESPIRATORY_TRACT | 12 refills | Status: AC | PRN

## 2016-08-15 MED ORDER — DOCUSATE SODIUM 50 MG/5ML PO LIQD: 100.0000 mg | Freq: Two times a day (BID) | ORAL | 0 refills | Status: AC

## 2016-08-15 MED ORDER — LEVOTHYROXINE SODIUM 112 MCG PO TABS
112.0000 ug | ORAL_TABLET | Freq: Once | ORAL | Status: AC
  Administered 2016-08-15: 112 ug via ORAL
  Filled 2016-08-15: qty 1

## 2016-08-15 MED FILL — ALBUTEROL 0.083% INHAL SOLN: (2.5 MG/3ML | 15 days supply | Qty: 90 | Fill #0

## 2016-08-15 MED FILL — ?FUROSEMIDE 40 MG TABLET: 40 | 30 days supply | Qty: 60 | Fill #0

## 2016-08-15 MED FILL — ?PANTOPRAZOLE SOD DR 40MG: 40 MG | 30 days supply | Qty: 30 | Fill #0

## 2016-08-15 MED FILL — POLYETHYLENE GLYCOL 3350: 15 days supply | Qty: 255 | Fill #0

## 2016-08-15 MED FILL — ?LEVOTHYROXINE 112 MCG TAB: 112 | 30 days supply | Qty: 30 | Fill #0

## 2016-08-15 NOTE — Care Management Note (Signed)
Case Management Note  Patient Details  Name: Kendra Osborne MRN: [id number] Date of Birth: [birth year]  Subjective/Objective:         Patient presented to [facility] ED for medication refill           Action/Plan: CM met with patient and son. Patient's son reports running out of medications and needing a nebulizer machine. Patient is a patient at the [facility] e-scribed prescriptions to the [facility], patient was ordered and given a neb machine in the ED, CM demonstrated use of neb machine for patient and family return demonstration given.  Patient and family denies any additional questions or concerns and is agreeable with care transition plan.   Expected Discharge Date:   08/15/2016               Expected Discharge Plan:   home with slef care/ nebulizer machine/ medication assistance/ [facility] follow up  In-House Referral:     Discharge planning Services     Post Acute Care Choice:    Choice offered to:     DME Arranged:  Nebulizer Machine DME Agency:     [facility]  HH Arranged:    HH Agency:     Status of Service:   completed signed off  If discussed at [place] of Stay Meetings, dates discussed:    Additional CommentsLaurena Slimmer, RN 08/15/2016, 12:02 AM

## 2016-08-15 NOTE — ED Notes (Signed)
Patient transported to X-ray 

## 2017-05-26 IMAGING — CT CT ANGIO CHEST
2 of 7 series · 18 of 36 positions shown · IV contrast (Omni 300)
Comparison: Radiographs 02/24/2016

CLINICAL DATA: Hypoxia.

EXAM:
CT ANGIOGRAPHY CHEST WITH CONTRAST
TECHNIQUE: Multidetector CT imaging of the chest was performed using the
standard protocol during bolus administration of intravenous
contrast. Multiplanar CT image reconstructions and MIPs were
obtained to evaluate the vascular anatomy.
CONTRAST:  100 mL Omnipaque 350 intravenous

[Series 8: pe thins · axial · 0.64mm/px · z∈[-502,-262]mm · 17 of 387 slices shown]
[im 22/387  lung]
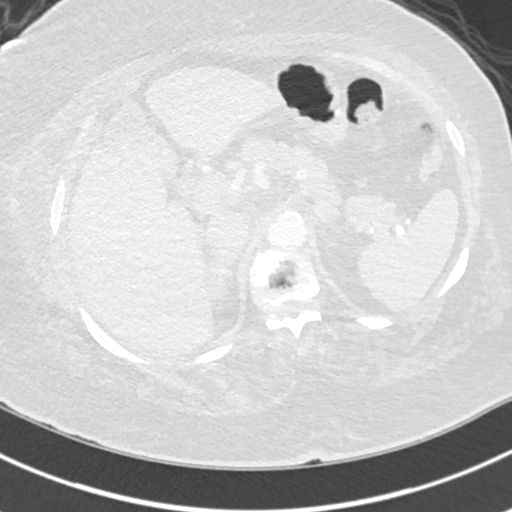
[im 43/387  mediastinal]
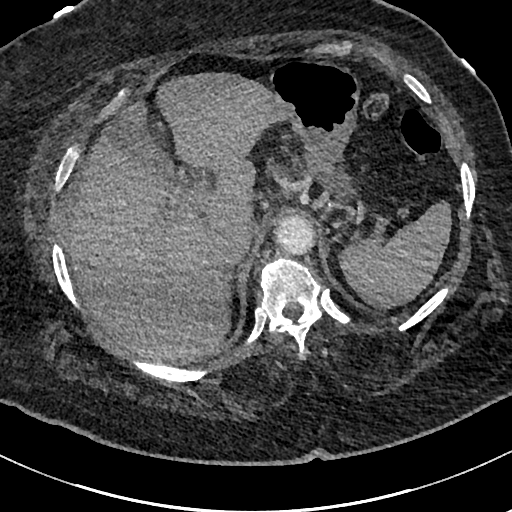
[im 65/387  lung]
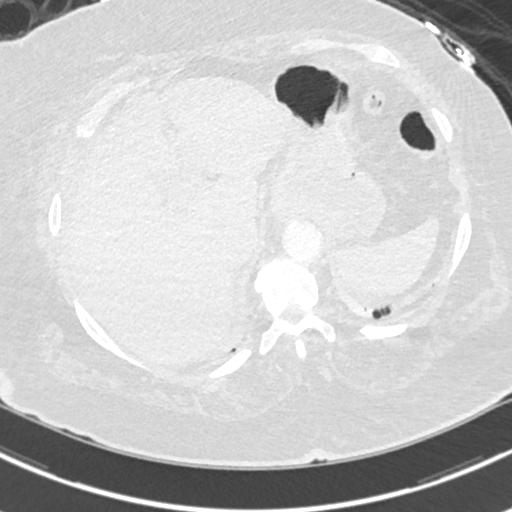
[im 86/387  mediastinal]
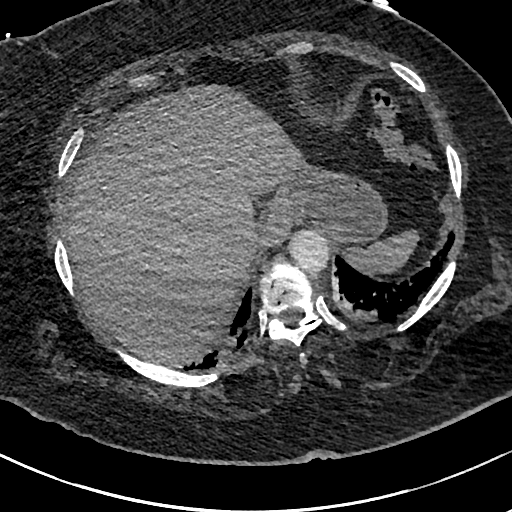
[im 108/387  lung]
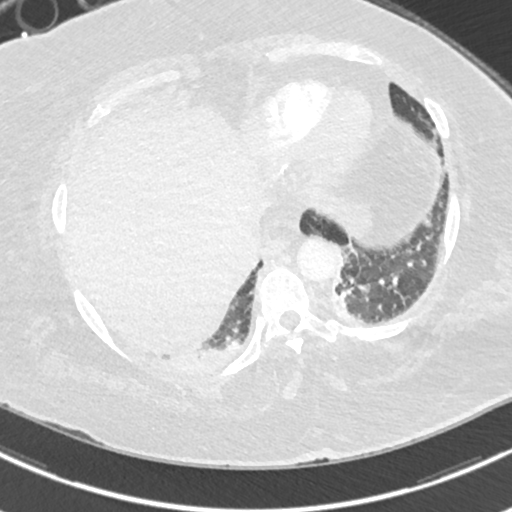
[im 129/387  mediastinal]
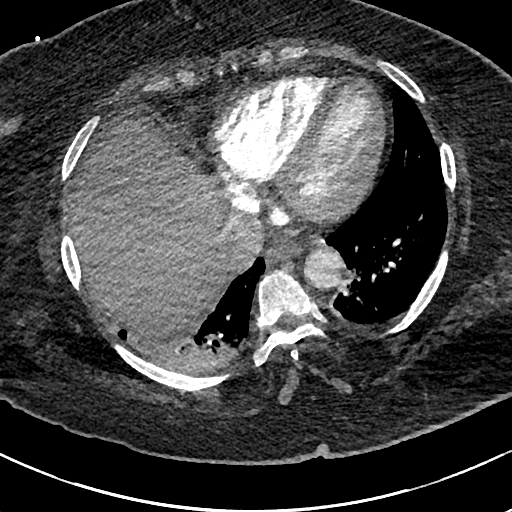
[im 151/387  lung]
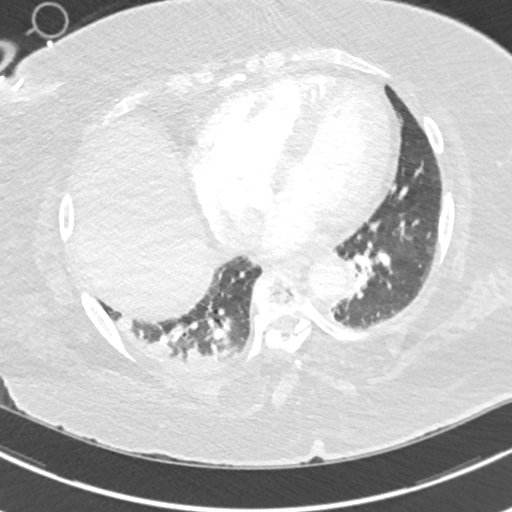
[im 172/387  mediastinal]
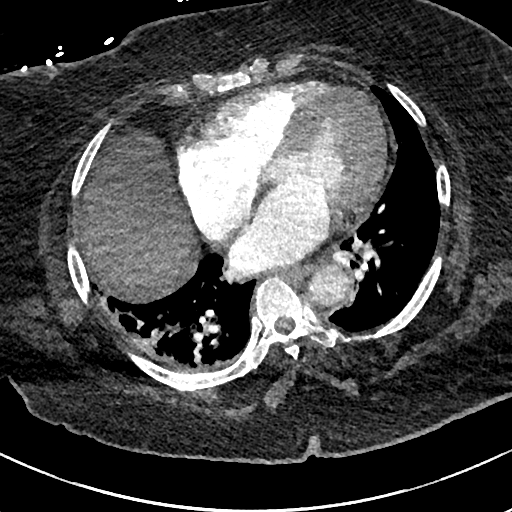
[im 194/387  lung]
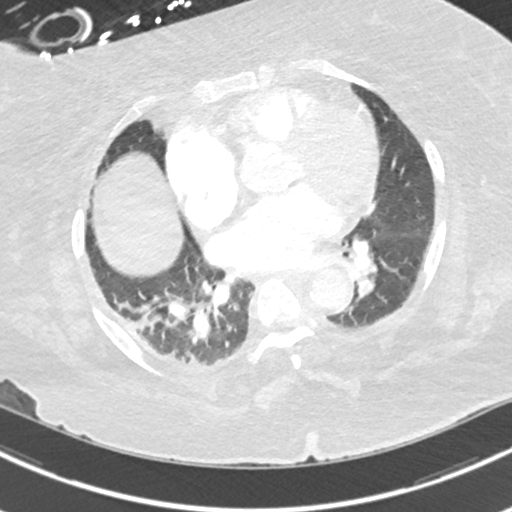
[im 215/387  mediastinal]
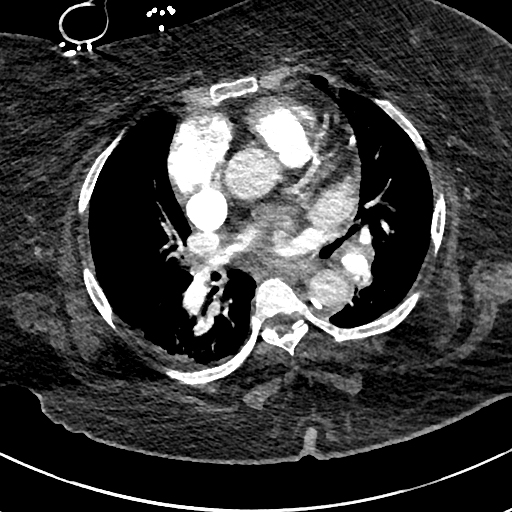
[im 236/387  lung]
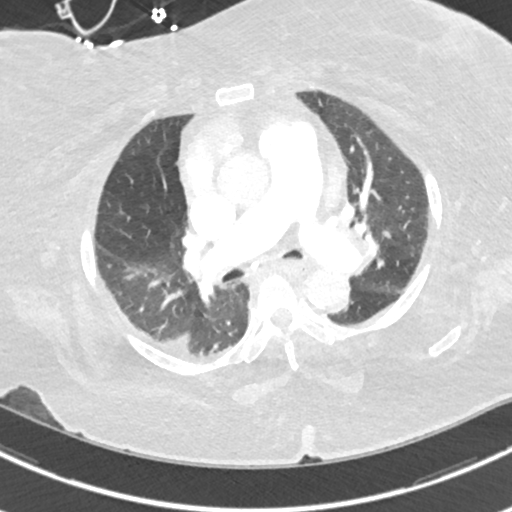
[im 258/387  mediastinal]
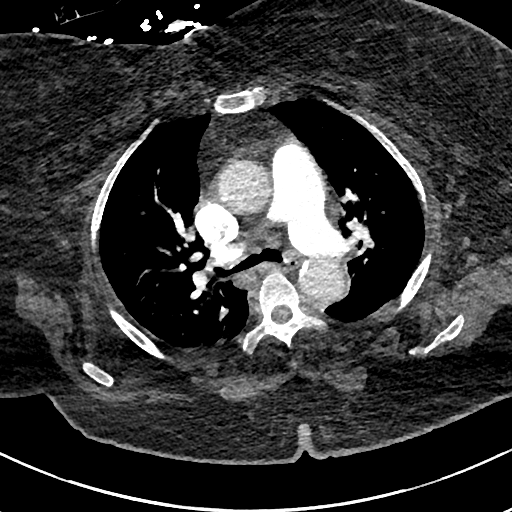
[im 279/387  lung]
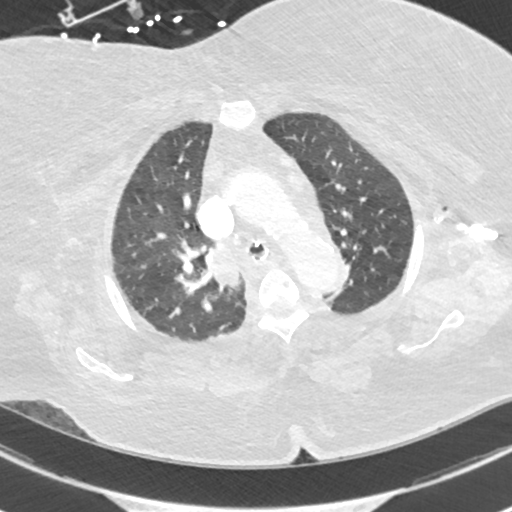
[im 301/387  mediastinal]
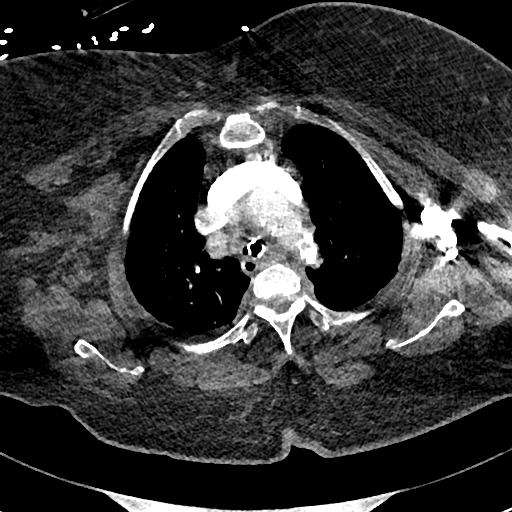
[im 322/387  lung]
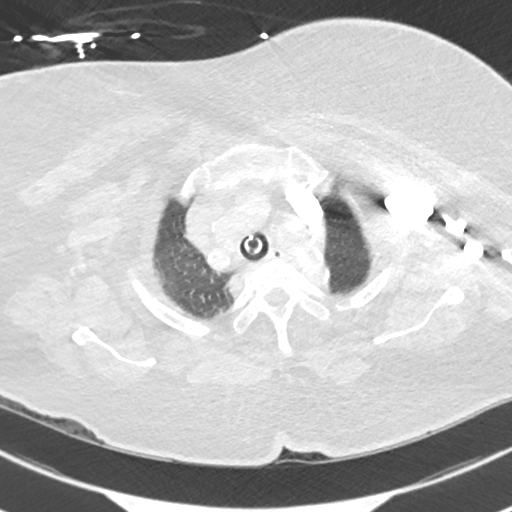
[im 344/387  mediastinal]
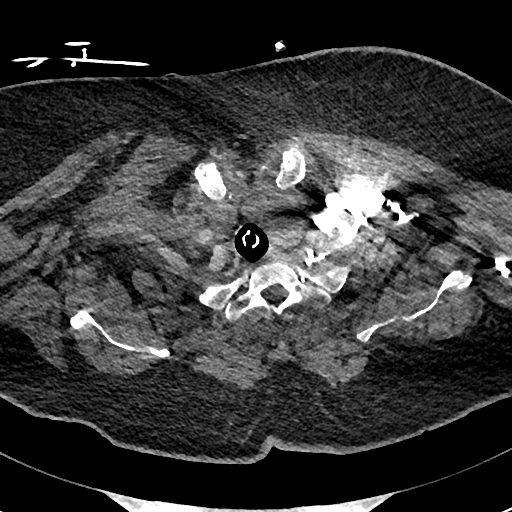
[im 365/387  lung]
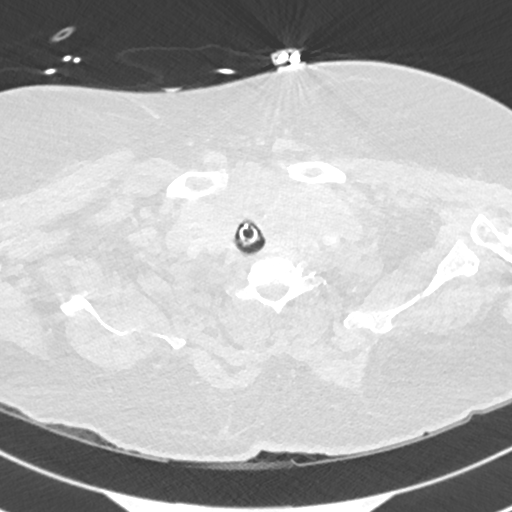

[Series 9: pe 2mm cor · coronal · 0.56mm/px · 1 of 86 slices shown]
[im 43/86  mediastinal]
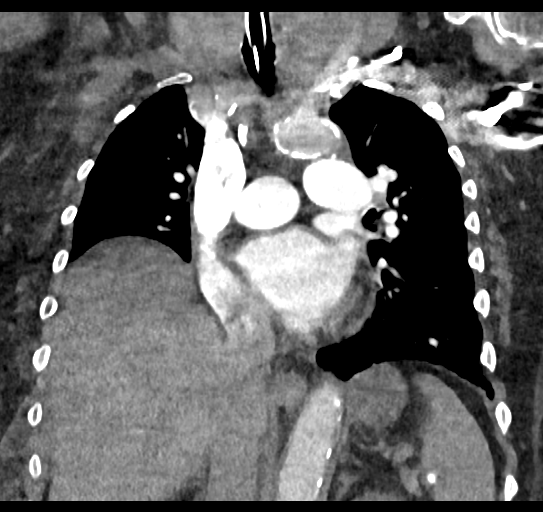

[18 of 36 positions shown; findings below may reference images not displayed]

FINDINGS: Cardiovascular: There is good opacification of the pulmonary
arteries. There is no pulmonary embolism. The thoracic aorta is
normal in caliber and intact. There is calcified coronary artery
plaque.

Lungs: Patchy opacities in the posterior bases, right greater than
left. This may be atelectatic.

Central airways: Endotracheal tube extends to 1 cm above the carina.
Central airways are patent.

Effusions: None

Lymphadenopathy: Nonspecific nodes in the mediastinum and hila.
There is marked thyroid enlargement extending into the upper chest.

Esophagus: Unremarkable

Upper abdomen: Multiple calculi within the gallbladder.

Musculoskeletal: No significant abnormality

Review of the MIP images confirms the above findings.
IMPRESSION: 1. Negative for acute pulmonary embolism.
2. Marked thyroid enlargement extending into the chest.
3. Atelectatic appearing basilar opacities, right greater than left.
4. Nonspecific nodes in the mediastinum and hila.
5. Cholelithiasis.

## 2020-12-08 ENCOUNTER — Other Ambulatory Visit (HOSPITAL_BASED_OUTPATIENT_CLINIC_OR_DEPARTMENT_OTHER): Payer: Self-pay | Admitting: Emergency Medicine

## 2020-12-08 ENCOUNTER — Emergency Department (HOSPITAL_BASED_OUTPATIENT_CLINIC_OR_DEPARTMENT_OTHER): Payer: Medicare Other

## 2020-12-08 ENCOUNTER — Emergency Department (HOSPITAL_BASED_OUTPATIENT_CLINIC_OR_DEPARTMENT_OTHER)
Admission: EM | Admit: 2020-12-08 | Discharge: 2020-12-08 | Disposition: A | Discharge status: Home / Self Care | Payer: Medicare Other | Attending: Emergency Medicine | Admitting: Emergency Medicine

## 2020-12-08 ENCOUNTER — Encounter (HOSPITAL_BASED_OUTPATIENT_CLINIC_OR_DEPARTMENT_OTHER): Payer: Self-pay

## 2020-12-08 ENCOUNTER — Other Ambulatory Visit: Payer: Self-pay

## 2020-12-08 DIAGNOSIS — R1013 Epigastric pain: Secondary | ICD-10-CM | POA: Diagnosis present

## 2020-12-08 DIAGNOSIS — K869 Disease of pancreas, unspecified: Secondary | ICD-10-CM | POA: Diagnosis not present

## 2020-12-08 DIAGNOSIS — K8689 Other specified diseases of pancreas: Secondary | ICD-10-CM

## 2020-12-08 HISTORY — DX: Pure hypercholesterolemia, unspecified: E78.00

## 2020-12-08 LAB — URINALYSIS, MICROSCOPIC (REFLEX)

## 2020-12-08 LAB — CBC
HCT: 39.6 % (ref 36.0–46.0)
Hemoglobin: 12.9 g/dL (ref 12.0–15.0)
MCH: 30.1 pg (ref 26.0–34.0)
MCHC: 32.6 g/dL (ref 30.0–36.0)
MCV: 92.5 fL (ref 80.0–100.0)
Platelets: 203 10*3/uL (ref 150–400)
RBC: 4.28 MIL/uL (ref 3.87–5.11)
RDW: 13 % (ref 11.5–15.5)
WBC: 9.5 10*3/uL (ref 4.0–10.5)
nRBC: 0 % (ref 0.0–0.2)

## 2020-12-08 LAB — URINALYSIS, ROUTINE W REFLEX MICROSCOPIC
Glucose, UA: NEGATIVE mg/dL
Ketones, ur: NEGATIVE mg/dL
Leukocytes,Ua: NEGATIVE
Nitrite: NEGATIVE
Protein, ur: NEGATIVE mg/dL
Specific Gravity, Urine: 1.02 (ref 1.005–1.030)
pH: 5 (ref 5.0–8.0)

## 2020-12-08 LAB — COMPREHENSIVE METABOLIC PANEL
ALT: 1010 U/L — ABNORMAL HIGH (ref 0–44)
AST: 612 U/L — ABNORMAL HIGH (ref 15–41)
Albumin: 4.2 g/dL (ref 3.5–5.0)
Alkaline Phosphatase: 307 U/L — ABNORMAL HIGH (ref 38–126)
Anion gap: 11 (ref 5–15)
BUN: 16 mg/dL (ref 8–23)
CO2: 25 mmol/L (ref 22–32)
Calcium: 9.2 mg/dL (ref 8.9–10.3)
Chloride: 100 mmol/L (ref 98–111)
Creatinine, Ser: 0.79 mg/dL (ref 0.44–1.00)
GFR, Estimated: >60 mL/min (ref >60)
Glucose, Bld: 138 mg/dL — ABNORMAL HIGH (ref 70–99)
Potassium: 3.6 mmol/L (ref 3.5–5.1)
Sodium: 136 mmol/L (ref 135–145)
Total Bilirubin: 3.5 mg/dL — ABNORMAL HIGH (ref 0.3–1.2)
Total Protein: 8.2 g/dL — ABNORMAL HIGH (ref 6.5–8.1)

## 2020-12-08 LAB — LIPASE, BLOOD: Lipase: 5319 U/L — ABNORMAL HIGH (ref 11–51)

## 2020-12-08 MED ORDER — ONDANSETRON 4 MG PO TBDP: 4.0000 mg | ORAL_TABLET | Freq: Three times a day (TID) | ORAL | 1 refills | Status: DC | PRN

## 2020-12-08 MED ORDER — IOHEXOL 300 MG/ML  SOLN
100.0000 mL | Freq: Once | INTRAMUSCULAR | Status: AC | PRN
  Administered 2020-12-08: 13:00:00 100 mL via INTRAVENOUS

## 2020-12-08 MED ORDER — TRAMADOL HCL 50 MG PO TABS
50.0000 mg | ORAL_TABLET | Freq: Four times a day (QID) | ORAL | 0 refills | Status: DC | PRN
Start: 2020-12-08 — End: 2020-12-08

## 2020-12-08 MED ORDER — SODIUM CHLORIDE 0.9 % IV SOLN: INTRAVENOUS | Status: DC

## 2020-12-08 MED FILL — traMADol HCL 50 MG TABS: 50 | 4 days supply | Qty: 15 | Fill #0

## 2020-12-08 MED FILL — ONDANSETRON ODT 4 MG TABLET: 4 | 3 days supply | Qty: 10 | Fill #0

## 2020-12-08 NOTE — Discharge Instructions (Signed)
Lab results provided.  CT scan and labs raise great concern for pancreatic mass.  That will need work-up in the next few days.  But does not require admission today.  Take the Zofran as needed for nausea or vomiting.  Recommend soft foods.  Call your internal medicine doctor on Monday for follow-up.

## 2020-12-08 NOTE — ED Notes (Signed)
Pt here with complaints of abdominal pain for 6 days. Vomiting after meals for 2 to 3 days. Denies diarrhea.

## 2020-12-08 NOTE — ED Provider Notes (Signed)
[facility] EMERGENCY DEPARTMENT Provider Note   CSN: [id number] Arrival date & time: 12/08/20  4196     History Chief Complaint  Patient presents with  . Abdominal Pain    Amy Gibson is a 74 y.o. female.  Patient with about a 6-day complaint of abdominal pain.  Vomiting after meals.  But able to keep some food down.  Able to keep liquids down.  No diarrhea.  Seems to be more nausea according to her husband.        Past Medical History:  Diagnosis Date  . High cholesterol     There are no problems to display for this patient.   History reviewed. No pertinent surgical history.   OB History   No obstetric history on file.     History reviewed. No pertinent family history.  Social History   Tobacco Use  . Smoking status: Never Smoker  Substance Use Topics  . Alcohol use: Never  . Drug use: Never    Home Medications Prior to Admission medications   Medication Sig Start Date End Date Taking? Authorizing Provider  ondansetron (ZOFRAN ODT) 4 MG disintegrating tablet Take 1 tablet (4 mg total) by mouth every 8 (eight) hours as needed. 12/08/20  Yes Vanetta Mulders, MD  traMADol (ULTRAM) 50 MG tablet Take 1 tablet (50 mg total) by mouth every 6 (six) hours as needed. 12/08/20  Yes Vanetta Mulders, MD  vitamin B-12 (CYANOCOBALAMIN) 100 MCG tablet Take 100 mcg by mouth daily.   Yes [provider]    Allergies    Patient has no known allergies.  Review of Systems   Review of Systems  Constitutional: Negative for chills and fever.  HENT: Negative for congestion, rhinorrhea and sore throat.   Eyes: Negative for visual disturbance.  Respiratory: Negative for cough and shortness of breath.   Cardiovascular: Negative for chest pain and leg swelling.  Gastrointestinal: Positive for abdominal pain and nausea. Negative for diarrhea and vomiting.  Genitourinary: Negative for dysuria.  Musculoskeletal: Negative for back pain and neck pain.   Skin: Negative for rash.  Neurological: Negative for dizziness, light-headedness and headaches.  Hematological: Does not bruise/bleed easily.  Psychiatric/Behavioral: Negative for confusion.    Physical Exam Updated Vital Signs BP (!) 158/78 (BP Location: Left Arm)   Pulse 69   Temp 98.4 F (36.9 C) (Oral)   Resp 14   Ht 1.575 m (5\' 2" )   Wt 70.3 kg   SpO2 100%   BMI 28.35 kg/m   Physical Exam Vitals and nursing note reviewed.  Constitutional:      General: She is not in acute distress.    Appearance: Normal appearance. She is well-developed and well-nourished.  HENT:     Head: Normocephalic and atraumatic.  Eyes:     General: Scleral icterus present.     Extraocular Movements: Extraocular movements intact.     Conjunctiva/sclera: Conjunctivae normal.     Pupils: Pupils are equal, round, and reactive to light.  Cardiovascular:     Rate and Rhythm: Normal rate and regular rhythm.     Heart sounds: No murmur heard.   Pulmonary:     Effort: Pulmonary effort is normal. No respiratory distress.     Breath sounds: Normal breath sounds.  Abdominal:     Palpations: Abdomen is soft.     Tenderness: There is abdominal tenderness. There is no guarding.     Comments: Mild tenderness epigastric area.  No guarding.  Musculoskeletal:  General: No swelling or edema. Normal range of motion.     Cervical back: Normal range of motion and neck supple.  Skin:    General: Skin is warm and dry.  Neurological:     General: No focal deficit present.     Mental Status: She is alert and oriented to person, place, and time.     Cranial Nerves: No cranial nerve deficit.     Sensory: No sensory deficit.     Motor: No weakness.     Coordination: Coordination normal.  Psychiatric:        Mood and Affect: Mood and affect normal.     ED Results / Procedures / Treatments   Labs (all labs ordered are listed, but only abnormal results are displayed) Labs Reviewed  LIPASE, BLOOD -  Abnormal; Notable for the following components:      Result Value   Lipase 5,319 (*)    All other components within normal limits  COMPREHENSIVE METABOLIC PANEL - Abnormal; Notable for the following components:   Glucose, Bld 138 (*)    Total Protein 8.2 (*)    AST 612 (*)    ALT 1,010 (*)    Alkaline Phosphatase 307 (*)    Total Bilirubin 3.5 (*)    All other components within normal limits  URINALYSIS, ROUTINE W REFLEX MICROSCOPIC - Abnormal; Notable for the following components:   Hgb urine dipstick SMALL (*)    Bilirubin Urine MODERATE (*)    All other components within normal limits  URINALYSIS, MICROSCOPIC (REFLEX) - Abnormal; Notable for the following components:   Bacteria, UA RARE (*)    All other components within normal limits  CBC    EKG None  Radiology CT Abdomen Pelvis W Contrast  Result Date: 12/08/2020 CLINICAL DATA:  74 year old female with history of left upper quadrant and epigastric abdominal pain for the past 6 days. Vomiting after meals. EXAM: CT ABDOMEN AND PELVIS WITH CONTRAST TECHNIQUE: Multidetector CT imaging of the abdomen and pelvis was performed using the standard protocol following bolus administration of intravenous contrast. CONTRAST:  100mL OMNIPAQUE IOHEXOL 300 MG/ML  SOLN COMPARISON:  No priors. FINDINGS: Lower chest: Aortic atherosclerosis. Hepatobiliary: No discrete cystic or solid hepatic lesions. However, there is some very mild intrahepatic biliary ductal dilatation. Common bile duct is also dilated up to 12 mm in the porta hepatis. The duct appears to abruptly taper shortly before the level of the ampulla. No definite calcified choledocholithiasis. Gallbladder is moderately distended, with normal wall thickness and no pericholecystic fluid or surrounding inflammatory changes. No calcified gallstones are identified within the gallbladder. Pancreas: In the anterior aspect of the head of the pancreas there is a poorly defined hypovascular area best  appreciated on axial image 31 of series 2 and coronal image 31 of series 5, which is estimated to measure approximately 3.3 x 1.3 x 4.1 cm. Slight haziness in the adjacent peripancreatic fat is very subtle. No peripancreatic fluid collections or other overt inflammatory changes. Body and tail of the pancreas are otherwise unremarkable in appearance. There does appear to be some mild ectasia/dilatation of the distal pancreatic duct which measures up to 3-4 mm in the head and proximal body of the pancreas. Spleen: Unremarkable. Adrenals/Urinary Tract: Subcentimeter low-attenuation lesions in both kidneys, too small to definitively characterize, but statistically likely to represent tiny cysts. No hydroureteronephrosis. Urinary bladder is unremarkable in appearance. Bilateral adrenal glands are normal in appearance. Stomach/Bowel: Normal appearance of the stomach. No pathologic dilatation of small  bowel or colon. Normal appendix. Vascular/Lymphatic: Aortic atherosclerosis, without evidence of aneurysm or dissection in the abdominal or pelvic vasculature. Distal superior mesenteric vein and adjacent splenoportal confluence appears slightly narrowed as a traversed by the hypovascular region in the anterior pancreatic head (discussed above). No lymphadenopathy noted in the abdomen or pelvis. Reproductive: Status post hysterectomy.  Ovaries are atrophic. Other: No significant volume of ascites.  No pneumoperitoneum. Musculoskeletal: There are no aggressive appearing lytic or blastic lesions noted in the visualized portions of the skeleton. IMPRESSION: 1. 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 There are very subtle surrounding inflammatory changes. The possibility of acute pancreatitis warrants consideration and correlation with lipase levels is recommended. However, this process appears to be exerting some local mass effect upon adjacent veins and bile ducts causing intra and extrahepatic biliary  ductal dilatation, as detailed above. The possibility of primary pancreatic neoplasm must be excluded. Further evaluation with abdominal MRI with and without IV gadolinium with MRCP is strongly recommended in the near future to better evaluate this finding. This examination would also exclude the possibility of noncalcified choledocholithiasis. 2. Aortic atherosclerosis. 3. Additional incidental findings, as above. Electronically Signed   By: Trudie Reed M.D.   On: 12/08/2020 14:12   DG Chest Port 1 View  Result Date: 12/08/2020 CLINICAL DATA:  Pain EXAM: PORTABLE CHEST 1 VIEW COMPARISON:  None. FINDINGS: Lungs are clear. Heart size and pulmonary vascularity are normal. No adenopathy. No bone lesions. No pneumothorax. IMPRESSION: Lungs clear.  Cardiac silhouette normal. Electronically Signed   By: Bretta Bang III M.D.   On: 12/08/2020 14:05    Procedures Procedures (including critical care time)  Medications Ordered in ED Medications  0.9 %  sodium chloride infusion ( Intravenous New Bag/Given 12/08/20 1342)  iohexol (OMNIPAQUE) 300 MG/ML solution 100 mL (100 mLs Intravenous Contrast Given 12/08/20 1320)    ED Course  I have reviewed the triage vital signs and the nursing notes.  Pertinent labs & imaging results that were available during my care of the patient were reviewed by me and considered in my medical decision making (see chart for details).    MDM Rules/Calculators/A&P                          Labs and CT scan highly suggestive of pancreatic mass.  Patient is not listening symptoms consistent with pancreatitis.  Patient is followed by internal medicine in the [facility] area through [facility].  They want a follow-up with their internal medicine doctor.  They say they can get an appointment first part of next week.  Patient does not absolutely require admission at this time.  Regular electrolytes are okay.  Patient without any vomiting here.  We will treat her with  Zofran and tramadol as needed for pain.  Their lab results and CAT scan results provided to them to take to their primary internal medicine doctor.     Final Clinical Impression(s) / ED Diagnoses Final diagnoses:  Pancreatic mass    Rx / DC Orders ED Discharge Orders         Ordered    ondansetron (ZOFRAN ODT) 4 MG disintegrating tablet  Every 8 hours PRN        12/08/20 1445    traMADol (ULTRAM) 50 MG tablet  Every 6 hours PRN        12/08/20 1445           Azelia Reiger, [place],  MD 12/08/20 1451

## 2020-12-08 NOTE — ED Triage Notes (Signed)
Pt c/o upper abdominal pain for a couple of days along with n/v.

## 2020-12-08 NOTE — ED Notes (Signed)
PT to CT.
